# Patient Record
Sex: Female | Born: 1940 | Race: Black or African American | Hispanic: No | Smoking: Former smoker
Health system: Southern US, Community
[De-identification: ages and names within clinical notes are randomized; demographics above are authoritative.]

## PROBLEM LIST (undated history)

## (undated) DIAGNOSIS — E1122 Type 2 diabetes mellitus with diabetic chronic kidney disease: Secondary | ICD-10-CM

## (undated) DIAGNOSIS — E538 Deficiency of other specified B group vitamins: Secondary | ICD-10-CM

## (undated) DIAGNOSIS — I10 Essential (primary) hypertension: Secondary | ICD-10-CM

## (undated) DIAGNOSIS — E119 Type 2 diabetes mellitus without complications: Secondary | ICD-10-CM

## (undated) DIAGNOSIS — I2510 Atherosclerotic heart disease of native coronary artery without angina pectoris: Secondary | ICD-10-CM

## (undated) DIAGNOSIS — N182 Chronic kidney disease, stage 2 (mild): Secondary | ICD-10-CM

## (undated) DIAGNOSIS — E7800 Pure hypercholesterolemia, unspecified: Secondary | ICD-10-CM

## (undated) DIAGNOSIS — M109 Gout, unspecified: Secondary | ICD-10-CM

## (undated) DIAGNOSIS — E785 Hyperlipidemia, unspecified: Secondary | ICD-10-CM

## (undated) HISTORY — DX: Gout, unspecified: M10.9

## (undated) HISTORY — DX: Essential (primary) hypertension: I10

## (undated) HISTORY — DX: Deficiency of other specified B group vitamins: E53.8

## (undated) HISTORY — DX: Atherosclerotic heart disease of native coronary artery without angina pectoris: I25.10

## (undated) HISTORY — DX: Chronic kidney disease, stage 2 (mild): N18.2

## (undated) HISTORY — DX: Hyperlipidemia, unspecified: E78.5

## (undated) HISTORY — DX: Pure hypercholesterolemia, unspecified: E78.00

## (undated) HISTORY — DX: Type 2 diabetes mellitus without complications: E11.9

## (undated) HISTORY — DX: Type 2 diabetes mellitus with diabetic chronic kidney disease: E11.22

---

## 1974-05-14 HISTORY — PX: TUBAL LIGATION: SHX77

## 1997-12-08 ENCOUNTER — Other Ambulatory Visit: Admission: RE | Admit: 1997-12-08 | Discharge: 1997-12-08 | Payer: Self-pay | Admitting: Emergency Medicine

## 1999-03-21 ENCOUNTER — Other Ambulatory Visit: Admission: RE | Admit: 1999-03-21 | Discharge: 1999-03-21 | Payer: Self-pay | Admitting: Family Medicine

## 2000-08-20 ENCOUNTER — Encounter: Admission: RE | Admit: 2000-08-20 | Discharge: 2000-08-20 | Payer: Self-pay | Admitting: Internal Medicine

## 2000-08-20 ENCOUNTER — Encounter: Payer: Self-pay | Admitting: Internal Medicine

## 2000-09-10 ENCOUNTER — Other Ambulatory Visit: Admission: RE | Admit: 2000-09-10 | Discharge: 2000-09-10 | Payer: Self-pay | Admitting: Internal Medicine

## 2000-10-09 ENCOUNTER — Encounter: Admission: RE | Admit: 2000-10-09 | Discharge: 2001-01-07 | Payer: Self-pay | Admitting: Internal Medicine

## 2002-03-25 ENCOUNTER — Encounter: Payer: Self-pay | Admitting: Emergency Medicine

## 2002-03-25 ENCOUNTER — Emergency Department (HOSPITAL_COMMUNITY): Admission: EM | Admit: 2002-03-25 | Discharge: 2002-03-25 | Payer: Self-pay | Admitting: Emergency Medicine

## 2003-03-09 ENCOUNTER — Emergency Department (HOSPITAL_COMMUNITY): Admission: EM | Admit: 2003-03-09 | Discharge: 2003-03-09 | Payer: Self-pay | Admitting: Emergency Medicine

## 2004-07-20 ENCOUNTER — Emergency Department (HOSPITAL_COMMUNITY): Admission: EM | Admit: 2004-07-20 | Discharge: 2004-07-20 | Payer: Self-pay | Admitting: Family Medicine

## 2004-07-23 ENCOUNTER — Emergency Department (HOSPITAL_COMMUNITY): Admission: EM | Admit: 2004-07-23 | Discharge: 2004-07-23 | Payer: Self-pay | Admitting: Family Medicine

## 2004-08-08 ENCOUNTER — Encounter: Admission: RE | Admit: 2004-08-08 | Discharge: 2004-08-16 | Payer: Self-pay | Admitting: Emergency Medicine

## 2005-07-24 ENCOUNTER — Other Ambulatory Visit: Admission: RE | Admit: 2005-07-24 | Discharge: 2005-07-24 | Payer: Self-pay | Admitting: Family Medicine

## 2005-08-31 ENCOUNTER — Ambulatory Visit (HOSPITAL_COMMUNITY): Admission: RE | Admit: 2005-08-31 | Discharge: 2005-08-31 | Payer: Self-pay | Admitting: Gastroenterology

## 2006-10-10 ENCOUNTER — Encounter: Admission: RE | Admit: 2006-10-10 | Discharge: 2006-10-10 | Payer: Self-pay | Admitting: Family Medicine

## 2006-10-31 ENCOUNTER — Other Ambulatory Visit: Admission: RE | Admit: 2006-10-31 | Discharge: 2006-10-31 | Payer: Self-pay | Admitting: Family Medicine

## 2006-11-11 ENCOUNTER — Encounter: Admission: RE | Admit: 2006-11-11 | Discharge: 2006-11-11 | Payer: Self-pay | Admitting: Orthopedic Surgery

## 2007-11-18 ENCOUNTER — Other Ambulatory Visit: Admission: RE | Admit: 2007-11-18 | Discharge: 2007-11-18 | Payer: Self-pay | Admitting: Family Medicine

## 2009-06-06 ENCOUNTER — Encounter: Admission: RE | Admit: 2009-06-06 | Discharge: 2009-06-06 | Payer: Self-pay | Admitting: Internal Medicine

## 2010-06-03 ENCOUNTER — Other Ambulatory Visit: Payer: Self-pay | Admitting: Internal Medicine

## 2010-06-03 DIAGNOSIS — Z0000 Encounter for general adult medical examination without abnormal findings: Secondary | ICD-10-CM

## 2010-06-15 ENCOUNTER — Ambulatory Visit
Admission: RE | Admit: 2010-06-15 | Discharge: 2010-06-15 | Disposition: A | Discharge status: Home / Self Care | Payer: Medicare Other | Source: Ambulatory Visit | Attending: Internal Medicine | Admitting: Internal Medicine

## 2010-06-15 DIAGNOSIS — Z0000 Encounter for general adult medical examination without abnormal findings: Secondary | ICD-10-CM

## 2010-09-29 NOTE — Op Note (Signed)
[name], Pamela Key NO.:  [id number]   MEDICAL RECORD NO.:  [id number]          PATIENT TYPE:  AMB   LOCATION:  ENDO                         FACILITY:  [facility]   PHYSICIAN:  Anselmo Rod, M.D.  DATE OF BIRTH:  11-11-1940   DATE OF PROCEDURE:  08/31/2005  DATE OF DISCHARGE:                                 OPERATIVE REPORT   PROCEDURE PERFORMED:  Screening colonoscopy.   ENDOSCOPIST:  Anselmo Rod, M.D.   INSTRUMENT USED:  Olympus video colonoscope.   INDICATIONS FOR PROCEDURE:  The patient is a 70 year old African-American  female undergoing screening colonoscopy.  The patient has a family history  of colon cancer in several family members.  Rule out colonic polyps, masses,  etc.   PREPROCEDURE PREPARATION:  Informed consent was procured from the patient.  The patient was fasted for four hours prior to the procedure and prepped  with OsmoPrep pills the night of and the morning of the procedure.  The  risks and benefits of the procedure including a 10% miss rate for cancer or  polyps was discussed with the patient as well.   PREPROCEDURE PHYSICAL:  The patient had stable vital signs.  Neck supple.  Chest clear to auscultation.  S1 and S2 regular.  Abdomen soft with normal  bowel sounds.   DESCRIPTION OF PROCEDURE:  The patient was placed in left lateral decubitus  position and sedated with 100 mcg of fentanyl and 7 mg of Versed in slow  incremental doses.  Once the patient was adequately sedated and maintained  on low flow oxygen and continuous cardiac monitoring, the Olympus video  colonoscope was advanced from the rectum to the cecum.  The appendicular  orifice and ileocecal valve were clearly visualized and photographed. The  terminal ileum appeared normal without lesions.  No masses, polyps, erosions  and no diverticula were seen.  Small internal hemorrhoids were seen on  retroflexion in the rectum. The patient tolerated the procedure well without  immediate complication.   IMPRESSION:  Normal colonoscopy up to the terminal ileum except for small  internal hemorrhoids.  No masses or polyps seen.   RECOMMENDATIONS:  1.  A repeat colonoscopy has been recommended in the next five years unless      the patient develops any abnormal symptoms in the interim.  2.  Outpatient followup as need arises in the future.      Anselmo Rod, M.D.  Electronically Signed     JNM/MEDQ  D:  08/31/2005  T:  09/03/2005  Job:  [id number]   cc:   Renaye Rakers, M.D.  Fax: [phone]

## 2010-10-15 ENCOUNTER — Emergency Department (HOSPITAL_COMMUNITY): Payer: Medicare Other

## 2010-10-15 ENCOUNTER — Emergency Department (HOSPITAL_COMMUNITY)
Admission: EM | Admit: 2010-10-15 | Discharge: 2010-10-15 | Disposition: A | Discharge status: Home / Self Care | Payer: Medicare Other | Attending: Emergency Medicine | Admitting: Emergency Medicine

## 2010-10-15 DIAGNOSIS — I10 Essential (primary) hypertension: Secondary | ICD-10-CM | POA: Insufficient documentation

## 2010-10-15 DIAGNOSIS — M109 Gout, unspecified: Secondary | ICD-10-CM | POA: Insufficient documentation

## 2010-10-15 DIAGNOSIS — M79609 Pain in unspecified limb: Secondary | ICD-10-CM | POA: Insufficient documentation

## 2011-07-20 ENCOUNTER — Other Ambulatory Visit: Payer: Self-pay | Admitting: Internal Medicine

## 2011-07-20 DIAGNOSIS — Z1231 Encounter for screening mammogram for malignant neoplasm of breast: Secondary | ICD-10-CM

## 2011-07-31 ENCOUNTER — Ambulatory Visit: Payer: Medicare Other

## 2011-08-27 ENCOUNTER — Ambulatory Visit
Admission: RE | Admit: 2011-08-27 | Discharge: 2011-08-27 | Disposition: A | Discharge status: Home / Self Care | Payer: Medicare Other | Source: Ambulatory Visit | Attending: Internal Medicine | Admitting: Internal Medicine

## 2011-08-27 DIAGNOSIS — Z1231 Encounter for screening mammogram for malignant neoplasm of breast: Secondary | ICD-10-CM

## 2012-09-12 ENCOUNTER — Other Ambulatory Visit: Payer: Self-pay

## 2012-09-12 DIAGNOSIS — Z1231 Encounter for screening mammogram for malignant neoplasm of breast: Secondary | ICD-10-CM

## 2012-10-20 ENCOUNTER — Ambulatory Visit
Admission: RE | Admit: 2012-10-20 | Discharge: 2012-10-20 | Disposition: A | Discharge status: Home / Self Care | Payer: Medicare Other | Source: Ambulatory Visit

## 2012-10-20 DIAGNOSIS — Z1231 Encounter for screening mammogram for malignant neoplasm of breast: Secondary | ICD-10-CM

## 2013-10-23 ENCOUNTER — Other Ambulatory Visit: Payer: Self-pay

## 2013-10-23 DIAGNOSIS — Z1231 Encounter for screening mammogram for malignant neoplasm of breast: Secondary | ICD-10-CM

## 2013-10-30 ENCOUNTER — Ambulatory Visit: Payer: Medicare Other

## 2013-12-04 ENCOUNTER — Ambulatory Visit
Admission: RE | Admit: 2013-12-04 | Discharge: 2013-12-04 | Disposition: A | Discharge status: Home / Self Care | Payer: Medicare Other | Source: Ambulatory Visit

## 2013-12-04 ENCOUNTER — Encounter (INDEPENDENT_AMBULATORY_CARE_PROVIDER_SITE_OTHER): Payer: Self-pay

## 2013-12-04 DIAGNOSIS — Z1231 Encounter for screening mammogram for malignant neoplasm of breast: Secondary | ICD-10-CM

## 2014-10-28 ENCOUNTER — Emergency Department (HOSPITAL_COMMUNITY)
Admission: EM | Admit: 2014-10-28 | Discharge: 2014-10-28 | Disposition: A | Discharge status: Home / Self Care | Payer: Medicare Other | Attending: Emergency Medicine | Admitting: Emergency Medicine

## 2014-10-28 ENCOUNTER — Encounter (HOSPITAL_COMMUNITY): Payer: Self-pay | Admitting: Emergency Medicine

## 2014-10-28 DIAGNOSIS — R42 Dizziness and giddiness: Secondary | ICD-10-CM

## 2014-10-28 DIAGNOSIS — Z79899 Other long term (current) drug therapy: Secondary | ICD-10-CM | POA: Insufficient documentation

## 2014-10-28 DIAGNOSIS — I159 Secondary hypertension, unspecified: Secondary | ICD-10-CM | POA: Insufficient documentation

## 2014-10-28 DIAGNOSIS — E119 Type 2 diabetes mellitus without complications: Secondary | ICD-10-CM | POA: Diagnosis not present

## 2014-10-28 HISTORY — DX: Essential (primary) hypertension: I10

## 2014-10-28 HISTORY — DX: Type 2 diabetes mellitus without complications: E11.9

## 2014-10-28 LAB — CBC
HCT: 39.8 % (ref 36.0–46.0)
Hemoglobin: 13.1 g/dL (ref 12.0–15.0)
MCH: 30.8 pg (ref 26.0–34.0)
MCHC: 32.9 g/dL (ref 30.0–36.0)
MCV: 93.4 fL (ref 78.0–100.0)
Platelets: 186 10*3/uL (ref 150–400)
RBC: 4.26 MIL/uL (ref 3.87–5.11)
RDW: 13.6 % (ref 11.5–15.5)
WBC: 5.7 10*3/uL (ref 4.0–10.5)

## 2014-10-28 LAB — BASIC METABOLIC PANEL
Anion gap: 8 (ref 5–15)
BUN: 16 mg/dL (ref 6–20)
CO2: 23 mmol/L (ref 22–32)
Calcium: 9.2 mg/dL (ref 8.9–10.3)
Chloride: 106 mmol/L (ref 101–111)
Creatinine, Ser: 0.86 mg/dL (ref 0.44–1.00)
GFR calc Af Amer: >60 mL/min (ref >60)
GFR calc non Af Amer: >60 mL/min (ref >60)
Glucose, Bld: 96 mg/dL (ref 65–99)
Potassium: 3.8 mmol/L (ref 3.5–5.1)
Sodium: 137 mmol/L (ref 135–145)

## 2014-10-28 LAB — CBG MONITORING, ED: Glucose-Capillary: 87 mg/dL (ref 65–99)

## 2014-10-28 NOTE — ED Notes (Signed)
Delay on lab draw, pt enroute to bathroom

## 2014-10-28 NOTE — ED Notes (Signed)
Pt c/o lightheadedness x 2 days.  Denies NVD.  Denies pain.

## 2014-10-28 NOTE — ED Provider Notes (Signed)
CSN: [id number]     Arrival date & time 10/28/14  1819 History   First MD Initiated Contact with Patient 10/28/14 1937     Chief Complaint  Patient presents with  . Dizziness     (Consider location/radiation/quality/duration/timing/severity/associated sxs/prior Treatment) Patient is a 74 y.o. female presenting with dizziness.  Dizziness Quality:  Lightheadedness Severity:  Moderate Onset quality:  Sudden Duration:  2 days Timing:  Intermittent Progression:  Waxing and waning Chronicity:  New Relieved by:  Nothing Worsened by:  Nothing Ineffective treatments:  None tried Associated symptoms: no blood in stool, no chest pain, no diarrhea, no headaches, no hearing loss, no nausea, no palpitations, no shortness of breath, no syncope, no tinnitus, no vomiting and no weakness   Risk factors: no anemia, no heart disease, no hx of stroke, no hx of vertigo, no Meniere's disease, no multiple medications and no new medications     Past Medical History  Diagnosis Date  . Diabetes mellitus without complication   . Hypertension    History reviewed. No pertinent past surgical history. History reviewed. No pertinent family history. History  Substance Use Topics  . Smoking status: Never Smoker   . Smokeless tobacco: Not on file  . Alcohol Use: No   OB History    No data available     Review of Systems  Constitutional: Negative for fever, chills, diaphoresis, activity change, appetite change and fatigue.  HENT: Negative for congestion, facial swelling, hearing loss, rhinorrhea, sore throat and tinnitus.   Eyes: Negative for photophobia and discharge.  Respiratory: Negative for cough, chest tightness and shortness of breath.   Cardiovascular: Negative for chest pain, palpitations, leg swelling and syncope.  Gastrointestinal: Negative for nausea, vomiting, abdominal pain, diarrhea and blood in stool.  Endocrine: Negative for polydipsia and polyuria.  Genitourinary: Negative for  dysuria, frequency, difficulty urinating and pelvic pain.  Musculoskeletal: Negative for back pain, arthralgias, neck pain and neck stiffness.  Skin: Negative for color change and wound.  Allergic/Immunologic: Negative for immunocompromised state.  Neurological: Positive for dizziness and light-headedness. Negative for facial asymmetry, weakness, numbness and headaches.  Hematological: Does not bruise/bleed easily.  Psychiatric/Behavioral: Negative for confusion and agitation.      Allergies  Review of patient's allergies indicates no known allergies.  Home Medications   Prior to Admission medications   Medication Sig Start Date End Date Taking? Authorizing Provider  CALCIUM PO Take 1 tablet by mouth daily.   Yes Historical Provider, MD  cholecalciferol (VITAMIN D) 1000 UNITS tablet Take 1,000 Units by mouth daily.   Yes Historical Provider, MD  lisinopril (PRINIVIL,ZESTRIL) 10 MG tablet Take 10 mg by mouth daily.  10/25/14  Yes Historical Provider, MD  metFORMIN (GLUCOPHAGE) 500 MG tablet Take 500 mg by mouth 2 (two) times daily with a meal.  08/23/14  Yes Historical Provider, MD  simvastatin (ZOCOR) 10 MG tablet Take 10 mg by mouth daily.  10/14/14  Yes Historical Provider, MD   BP 171/83 mmHg  Pulse 66  Temp(Src) 98.1 F (36.7 C) (Oral)  Resp 16  SpO2 99% Physical Exam  Constitutional: She is oriented to person, place, and time. She appears well-developed and well-nourished. No distress.  HENT:  Head: Normocephalic and atraumatic.  Mouth/Throat: No oropharyngeal exudate.  Eyes: Pupils are equal, round, and reactive to light.  Neck: Normal range of motion. Neck supple.  Cardiovascular: Normal rate, regular rhythm and normal heart sounds.  Exam reveals no gallop and no friction rub.   No murmur  heard. Pulmonary/Chest: Effort normal and breath sounds normal. No respiratory distress. She has no wheezes. She has no rales.  Abdominal: Soft. Bowel sounds are normal. She exhibits no  distension and no mass. There is no tenderness. There is no rebound and no guarding.  Musculoskeletal: Normal range of motion. She exhibits no edema or tenderness.  Neurological: She is alert and oriented to person, place, and time. She has normal strength. She displays no tremor. No cranial nerve deficit or sensory deficit. She exhibits normal muscle tone. Coordination and gait normal. GCS eye subscore is 4. GCS verbal subscore is 5. GCS motor subscore is 6.  Skin: Skin is warm and dry.  Psychiatric: She has a normal mood and affect.    ED Course  Procedures (including critical care time) Labs Review Labs Reviewed  CBC  BASIC METABOLIC PANEL  CBG MONITORING, ED    Imaging Review No results found.   EKG Interpretation   Date/Time:  Thursday October 28 2014 18:26:36 EDT Ventricular Rate:  60 PR Interval:  167 QRS Duration: 81 QT Interval:  420 QTC Calculation: 420 R Axis:   43 Text Interpretation:  Sinus rhythm Nonspecific T abnormalities, lateral  leads Baseline wander in lead(s) V2 V6 No prior for comparison Confirmed  by Jyren Cerasoli  MD, Werner Labella ([id number]) on 10/28/2014 7:37:47 PM      MDM   Final diagnoses:  Episodic lightheadedness  Secondary hypertension, unspecified    Pt is a 74 y.o. female with Pmhx as above who presents with 2 days of intermittent lightheadedness, unchanged with position and wi/o assoc symptoms. On PE, Pt hypertensive, but has no findings to suggest end organ dysfunction including no EKG changes, nml cr, no CP, SOB. Neuro exam nml. Doubt, CVA, TIA, SAH. Symptoms likely due to elevated BP.  I have asked her to start a log of her blood pressure at home to follow-up with with her primary doctor, Dr. Renae Gloss.     Mendel Corning evaluation in the Emergency Department is complete. It has been determined that no acute conditions requiring further emergency intervention are present at this time. The patient/guardian have been advised of the diagnosis and plan. We  have discussed signs and symptoms that warrant return to the ED, such as changes or worsening in symptoms, Headache, chest pain, numbness, weakness       Toy Cookey, MD 10/28/14 2037

## 2014-10-28 NOTE — ED Notes (Signed)
MD at bedside. 

## 2014-10-28 NOTE — Discharge Instructions (Signed)
Hypertension °Hypertension, commonly called high blood pressure, is when the force of blood pumping through your arteries is too strong. Your arteries are the blood vessels that carry blood from your heart throughout your body. A blood pressure reading consists of a higher number over a lower number, such as 110/72. The higher number (systolic) is the pressure inside your arteries when your heart pumps. The lower number (diastolic) is the pressure inside your arteries when your heart relaxes. Ideally you want your blood pressure below 120/80. °Hypertension forces your heart to work harder to pump blood. Your arteries may become narrow or stiff. Having hypertension puts you at risk for heart disease, stroke, and other problems.  °RISK FACTORS °Some risk factors for high blood pressure are controllable. Others are not.  °Risk factors you cannot control include:  °· Race. You may be at higher risk if you are African American. °· Age. Risk increases with age. °· Gender. Men are at higher risk than women before age 45 years. After age 65, women are at higher risk than men. °Risk factors you can control include: °· Not getting enough exercise or physical activity. °· Being overweight. °· Getting too much fat, sugar, calories, or salt in your diet. °· Drinking too much alcohol. °SIGNS AND SYMPTOMS °Hypertension does not usually cause signs or symptoms. Extremely high blood pressure (hypertensive crisis) may cause headache, anxiety, shortness of breath, and nosebleed. °DIAGNOSIS  °To check if you have hypertension, your health care provider will measure your blood pressure while you are seated, with your arm held at the level of your heart. It should be measured at least twice using the same arm. Certain conditions can cause a difference in blood pressure between your right and left arms. A blood pressure reading that is higher than normal on one occasion does not mean that you need treatment. If one blood pressure reading  is high, ask your health care provider about having it checked again. °TREATMENT  °Treating high blood pressure includes making lifestyle changes and possibly taking medicine. Living a healthy lifestyle can help lower high blood pressure. You may need to change some of your habits. °Lifestyle changes may include: °· Following the DASH diet. This diet is high in fruits, vegetables, and whole grains. It is low in salt, red meat, and added sugars. °· Getting at least 2½ hours of brisk physical activity every week. °· Losing weight if necessary. °· Not smoking. °· Limiting alcoholic beverages. °· Learning ways to reduce stress. ° If lifestyle changes are not enough to get your blood pressure under control, your health care provider may prescribe medicine. You may need to take more than one. Work closely with your health care provider to understand the risks and benefits. °HOME CARE INSTRUCTIONS °· Have your blood pressure rechecked as directed by your health care provider.   °· Take medicines only as directed by your health care provider. Follow the directions carefully. Blood pressure medicines must be taken as prescribed. The medicine does not work as well when you skip doses. Skipping doses also puts you at risk for problems.   °· Do not smoke.   °· Monitor your blood pressure at home as directed by your health care provider.  °SEEK MEDICAL CARE IF:  °· You think you are having a reaction to medicines taken. °· You have recurrent headaches or feel dizzy. °· You have swelling in your ankles. °· You have trouble with your vision. °SEEK IMMEDIATE MEDICAL CARE IF: °· You develop a severe headache or confusion. °·   You have unusual weakness, numbness, or feel faint. °· You have severe chest or abdominal pain. °· You vomit repeatedly. °· You have trouble breathing. °MAKE SURE YOU:  °· Understand these instructions. °· Will watch your condition. °· Will get help right away if you are not doing well or get worse. °Document  Released: 04/30/2005 Document Revised: 09/14/2013 Document Reviewed: 02/20/2013 °ExitCare® Patient Information ©2015 [employer]. This information is not intended to replace advice given to you by your health care provider. Make sure you discuss any questions you have with your health care provider. °Dizziness °Dizziness is a common problem. It is a feeling of unsteadiness or light-headedness. You may feel like you are about to faint. Dizziness can lead to injury if you stumble or fall. A person of any age group can suffer from dizziness, but dizziness is more common in older adults. °CAUSES  °Dizziness can be caused by many different things, including: °· Middle ear problems. °· Standing for too long. °· Infections. °· An allergic reaction. °· Aging. °· An emotional response to something, such as the sight of blood. °· Side effects of medicines. °· Tiredness. °· Problems with circulation or blood pressure. °· Excessive use of alcohol or medicines, or illegal drug use. °· Breathing too fast (hyperventilation). °· An irregular heart rhythm (arrhythmia). °· A low red blood cell count (anemia). °· Pregnancy. °· Vomiting, diarrhea, fever, or other illnesses that cause body fluid loss (dehydration). °· Diseases or conditions such as Parkinson's disease, high blood pressure (hypertension), diabetes, and thyroid problems. °· Exposure to extreme heat. °DIAGNOSIS  °Your health care provider will ask about your symptoms, perform a physical exam, and perform an electrocardiogram (ECG) to record the electrical activity of your heart. Your health care provider may also perform other heart or blood tests to determine the cause of your dizziness. These may include: °· Transthoracic echocardiogram (TTE). During echocardiography, sound waves are used to evaluate how blood flows through your heart. °· Transesophageal echocardiogram (TEE). °· Cardiac monitoring. This allows your health care provider to monitor your heart rate and  rhythm in real time. °· Holter monitor. This is a portable device that records your heartbeat and can help diagnose heart arrhythmias. It allows your health care provider to track your heart activity for several days if needed. °· Stress tests by exercise or by giving medicine that makes the heart beat faster. °TREATMENT  °Treatment of dizziness depends on the cause of your symptoms and can vary greatly. °HOME CARE INSTRUCTIONS  °· Drink enough fluids to keep your urine clear or pale yellow. This is especially important in very hot weather. In older adults, it is also important in cold weather. °· Take your medicine exactly as directed if your dizziness is caused by medicines. When taking blood pressure medicines, it is especially important to get up slowly. °¨ Rise slowly from chairs and steady yourself until you feel okay. °¨ In the morning, first sit up on the side of the bed. When you feel okay, stand slowly while holding onto something until you know your balance is fine. °· Move your legs often if you need to stand in one place for a long time. Tighten and relax your muscles in your legs while standing. °· Have someone stay with you for 1-2 days if dizziness continues to be a problem. Do this until you feel you are well enough to stay alone. Have the person call your health care provider if he or she notices changes in you   that are concerning. °· Do not drive or use heavy machinery if you feel dizzy. °· Do not drink alcohol. °SEEK IMMEDIATE MEDICAL CARE IF:  °· Your dizziness or light-headedness gets worse. °· You feel nauseous or vomit. °· You have problems talking, walking, or using your arms, hands, or legs. °· You feel weak. °· You are not thinking clearly or you have trouble forming sentences. It may take a friend or family member to notice this. °· You have chest pain, abdominal pain, shortness of breath, or sweating. °· Your vision changes. °· You notice any bleeding. °· You have side effects from  medicine that seems to be getting worse rather than better. °MAKE SURE YOU:  °· Understand these instructions. °· Will watch your condition. °· Will get help right away if you are not doing well or get worse. °Document Released: 10/24/2000 Document Revised: 05/05/2013 Document Reviewed: 11/17/2010 °ExitCare® Patient Information ©2015 [employer]. This information is not intended to replace advice given to you by your health care provider. Make sure you discuss any questions you have with your health care provider. ° °

## 2014-12-15 ENCOUNTER — Other Ambulatory Visit: Payer: Self-pay

## 2014-12-15 DIAGNOSIS — Z1231 Encounter for screening mammogram for malignant neoplasm of breast: Secondary | ICD-10-CM

## 2015-01-24 ENCOUNTER — Ambulatory Visit
Admission: RE | Admit: 2015-01-24 | Discharge: 2015-01-24 | Disposition: A | Discharge status: Home / Self Care | Payer: Medicare Other | Source: Ambulatory Visit

## 2015-01-24 DIAGNOSIS — Z1231 Encounter for screening mammogram for malignant neoplasm of breast: Secondary | ICD-10-CM

## 2015-11-28 ENCOUNTER — Emergency Department (HOSPITAL_COMMUNITY): Payer: Medicare Other

## 2015-11-28 ENCOUNTER — Emergency Department (HOSPITAL_COMMUNITY)
Admission: EM | Admit: 2015-11-28 | Discharge: 2015-11-28 | Disposition: A | Discharge status: Home / Self Care | Payer: Medicare Other | Attending: Emergency Medicine | Admitting: Emergency Medicine

## 2015-11-28 ENCOUNTER — Encounter (HOSPITAL_COMMUNITY): Payer: Self-pay | Admitting: Emergency Medicine

## 2015-11-28 DIAGNOSIS — I10 Essential (primary) hypertension: Secondary | ICD-10-CM | POA: Insufficient documentation

## 2015-11-28 DIAGNOSIS — W19XXXA Unspecified fall, initial encounter: Secondary | ICD-10-CM

## 2015-11-28 DIAGNOSIS — R1011 Right upper quadrant pain: Secondary | ICD-10-CM | POA: Insufficient documentation

## 2015-11-28 DIAGNOSIS — W1839XA Other fall on same level, initial encounter: Secondary | ICD-10-CM | POA: Insufficient documentation

## 2015-11-28 DIAGNOSIS — M545 Low back pain: Secondary | ICD-10-CM | POA: Insufficient documentation

## 2015-11-28 DIAGNOSIS — Y929 Unspecified place or not applicable: Secondary | ICD-10-CM | POA: Insufficient documentation

## 2015-11-28 DIAGNOSIS — E119 Type 2 diabetes mellitus without complications: Secondary | ICD-10-CM | POA: Diagnosis not present

## 2015-11-28 DIAGNOSIS — Y93A1 Activity, exercise machines primarily for cardiorespiratory conditioning: Secondary | ICD-10-CM | POA: Insufficient documentation

## 2015-11-28 DIAGNOSIS — Y999 Unspecified external cause status: Secondary | ICD-10-CM | POA: Diagnosis not present

## 2015-11-28 DIAGNOSIS — Z7984 Long term (current) use of oral hypoglycemic drugs: Secondary | ICD-10-CM | POA: Insufficient documentation

## 2015-11-28 DIAGNOSIS — Z79899 Other long term (current) drug therapy: Secondary | ICD-10-CM | POA: Insufficient documentation

## 2015-11-28 LAB — BASIC METABOLIC PANEL
Anion gap: 9 (ref 5–15)
BUN: 14 mg/dL (ref 6–20)
CO2: 24 mmol/L (ref 22–32)
Calcium: 9.5 mg/dL (ref 8.9–10.3)
Chloride: 109 mmol/L (ref 101–111)
Creatinine, Ser: 0.86 mg/dL (ref 0.44–1.00)
GFR calc Af Amer: >60 mL/min (ref >60)
GFR calc non Af Amer: >60 mL/min (ref >60)
Glucose, Bld: 109 mg/dL — ABNORMAL HIGH (ref 65–99)
Potassium: 3.6 mmol/L (ref 3.5–5.1)
Sodium: 142 mmol/L (ref 135–145)

## 2015-11-28 MED ORDER — LORAZEPAM 2 MG/ML IJ SOLN
0.5000 mg | Freq: Once | INTRAMUSCULAR | Status: AC
  Administered 2015-11-28: 0.5 mg via INTRAVENOUS
  Filled 2015-11-28: qty 1

## 2015-11-28 MED ORDER — MORPHINE SULFATE (PF) 4 MG/ML IV SOLN
4.0000 mg | Freq: Once | INTRAVENOUS | Status: AC
  Administered 2015-11-28: 4 mg via INTRAVENOUS
  Filled 2015-11-28: qty 1

## 2015-11-28 MED ORDER — METHOCARBAMOL 500 MG PO TABS: 500.0000 mg | ORAL_TABLET | Freq: Two times a day (BID) | ORAL | Status: DC

## 2015-11-28 MED ORDER — HYDROCODONE-ACETAMINOPHEN 5-325 MG PO TABS: 1.0000 | ORAL_TABLET | ORAL | Status: DC | PRN

## 2015-11-28 MED ORDER — ONDANSETRON HCL 4 MG/2ML IJ SOLN
4.0000 mg | Freq: Once | INTRAMUSCULAR | Status: AC
  Administered 2015-11-28: 4 mg via INTRAVENOUS
  Filled 2015-11-28: qty 2

## 2015-11-28 MED ORDER — IOPAMIDOL (ISOVUE-300) INJECTION 61%
100.0000 mL | Freq: Once | INTRAVENOUS | Status: AC | PRN
  Administered 2015-11-28: 100 mL via INTRAVENOUS

## 2015-11-28 MED ORDER — SODIUM CHLORIDE 0.9 % IV SOLN
INTRAVENOUS | Status: DC
  Administered 2015-11-28: 13:00:00 via INTRAVENOUS

## 2015-11-28 NOTE — ED Notes (Signed)
Per EMS, [facility], where she fell off of the treadmill, falling forward. Bilateral knee abrasions. Pt reports abdominal and lower back pain. Denies head injury and LOC.

## 2015-11-28 NOTE — Discharge Instructions (Signed)
Contusion °A contusion is a deep bruise. Contusions are the result of a blunt injury to tissues and muscle fibers under the skin. The injury causes bleeding under the skin. The skin overlying the contusion may turn blue, purple, or yellow. Minor injuries will give you a painless contusion, but more severe contusions may stay painful and swollen for a few weeks.  °CAUSES  °This condition is usually caused by a blow, trauma, or direct force to an area of the body. °SYMPTOMS  °Symptoms of this condition include: °· Swelling of the injured area. °· Pain and tenderness in the injured area. °· Discoloration. The area may have redness and then turn blue, purple, or yellow. °DIAGNOSIS  °This condition is diagnosed based on a physical exam and medical history. An X-ray, CT scan, or MRI may be needed to determine if there are any associated injuries, such as broken bones (fractures). °TREATMENT  °Specific treatment for this condition depends on what area of the body was injured. In general, the best treatment for a contusion is resting, icing, applying pressure to (compression), and elevating the injured area. This is often called the RICE strategy. Over-the-counter anti-inflammatory medicines may also be recommended for pain control.  °HOME CARE INSTRUCTIONS  °· Rest the injured area. °· If directed, apply ice to the injured area: °¨ Put ice in a plastic bag. °¨ Place a towel between your skin and the bag. °¨ Leave the ice on for 20 minutes, 2-3 times per day. °· If directed, apply light compression to the injured area using an elastic bandage. Make sure the bandage is not wrapped too tightly. Remove and reapply the bandage as directed by your health care provider. °· If possible, raise (elevate) the injured area above the level of your heart while you are sitting or lying down. °· Take over-the-counter and prescription medicines only as told by your health care provider. °SEEK MEDICAL CARE IF: °· Your symptoms do not  improve after several days of treatment. °· Your symptoms get worse. °· You have difficulty moving the injured area. °SEEK IMMEDIATE MEDICAL CARE IF:  °· You have severe pain. °· You have numbness in a hand or foot. °· Your hand or foot turns pale or cold. °  °This information is not intended to replace advice given to you by your health care provider. Make sure you discuss any questions you have with your health care provider. °  °Document Released: 02/07/2005 Document Revised: 01/19/2015 Document Reviewed: 09/15/2014 °Elsevier Interactive Patient Education ©2016 [employer] ° °Muscle Strain °A muscle strain is an injury that occurs when a muscle is stretched beyond its normal length. Usually a small number of muscle fibers are torn when this happens. Muscle strain is rated in degrees. First-degree strains have the least amount of muscle fiber tearing and pain. Second-degree and third-degree strains have increasingly more tearing and pain.  °Usually, recovery from muscle strain takes 1-2 weeks. Complete healing takes 5-6 weeks.  °CAUSES  °Muscle strain happens when a sudden, violent force placed on a muscle stretches it too far. This may occur with lifting, sports, or a fall.  °RISK FACTORS °Muscle strain is especially common in athletes.  °SIGNS AND SYMPTOMS °At the site of the muscle strain, there may be: °· Pain. °· Bruising. °· Swelling. °· Difficulty using the muscle due to pain or lack of normal function. °DIAGNOSIS  °Your health care provider will perform a physical exam and ask about your medical history. °TREATMENT  °Often, the best treatment   for a muscle strain is resting, icing, and applying cold compresses to the injured area.   °HOME CARE INSTRUCTIONS  °· Use the PRICE method of treatment to promote muscle healing during the first 2-3 days after your injury. The PRICE method involves: °¨ Protecting the muscle from being injured again. °¨ Restricting your activity and resting the injured body  part. °¨ Icing your injury. To do this, put ice in a plastic bag. Place a towel between your skin and the bag. Then, apply the ice and leave it on from 15-20 minutes each hour. After the third day, switch to moist heat packs. °¨ Apply compression to the injured area with a splint or elastic bandage. Be careful not to wrap it too tightly. This may interfere with blood circulation or increase swelling. °¨ Elevate the injured body part above the level of your heart as often as you can. °· Only take over-the-counter or prescription medicines for pain, discomfort, or fever as directed by your health care provider. °· Warming up prior to exercise helps to prevent future muscle strains. °SEEK MEDICAL CARE IF:  °· You have increasing pain or swelling in the injured area. °· You have numbness, tingling, or a significant loss of strength in the injured area. °MAKE SURE YOU:  °· Understand these instructions. °· Will watch your condition. °· Will get help right away if you are not doing well or get worse. °  °This information is not intended to replace advice given to you by your health care provider. Make sure you discuss any questions you have with your health care provider. °  °Document Released: 04/30/2005 Document Revised: 02/18/2013 Document Reviewed: 11/27/2012 °Elsevier Interactive Patient Education ©2016 [employer]. ° °

## 2015-11-28 NOTE — ED Notes (Signed)
Patient transported to X-ray 

## 2015-11-28 NOTE — ED Notes (Signed)
Patient transported to CT 

## 2015-11-28 NOTE — ED Notes (Signed)
Bed: WA03 Expected date:  Expected time:  Means of arrival:  Comments: EMS- 75yo F, fell off treadmill/multiple complaints

## 2015-11-28 NOTE — ED Provider Notes (Signed)
CSN: [id number]     Arrival date & time 11/28/15  1210 History   First MD Initiated Contact with Patient 11/28/15 1226     Chief Complaint  Patient presents with  . Fall  . Abdominal Pain  . Back Pain     (Consider location/radiation/quality/duration/timing/severity/associated sxs/prior Treatment) HPI Comments: 75 year old female [facility] after falling from a treadmill. Patient states that it suddenly jerked and she flew frontwards. She did not strike her head. She does not have any neck pain. Denies any chest discomfort. Does complain of some lower lumbar spinal pain without radiation to her legs. Does note some bilateral knee pain but denies any hip discomfort. Also complains of some right upper quadrant abdominal discomfort. Pain is characterized as sharp and worse with movement better with remaining still. EMS was called and patient transferred here.  Patient is a 75 y.o. female presenting with fall, abdominal pain, and back pain. The history is provided by the patient.  Fall Associated symptoms include abdominal pain.  Abdominal Pain Back Pain Associated symptoms: abdominal pain     Past Medical History  Diagnosis Date  . Diabetes mellitus without complication (HCC)   . Hypertension    History reviewed. No pertinent past surgical history. History reviewed. No pertinent family history. Social History  Substance Use Topics  . Smoking status: Never Smoker   . Smokeless tobacco: None  . Alcohol Use: No   OB History    No data available     Review of Systems  Gastrointestinal: Positive for abdominal pain.  Musculoskeletal: Positive for back pain.  All other systems reviewed and are negative.     Allergies  Review of patient's allergies indicates no known allergies.  Home Medications   Prior to Admission medications   Medication Sig Start Date End Date Taking? Authorizing Provider  CALCIUM PO Take 1 tablet by mouth daily.    Historical Provider, MD    cholecalciferol (VITAMIN D) 1000 UNITS tablet Take 1,000 Units by mouth daily.    Historical Provider, MD  lisinopril (PRINIVIL,ZESTRIL) 10 MG tablet Take 10 mg by mouth daily.  10/25/14   Historical Provider, MD  metFORMIN (GLUCOPHAGE) 500 MG tablet Take 500 mg by mouth 2 (two) times daily with a meal.  08/23/14   Historical Provider, MD  simvastatin (ZOCOR) 10 MG tablet Take 10 mg by mouth daily.  10/14/14   Historical Provider, MD   BP 176/92 mmHg  Pulse 71  Temp(Src) 98 F (36.7 C) (Oral)  Resp 16  SpO2 100% Physical Exam  Constitutional: She is oriented to person, place, and time. She appears well-developed and well-nourished.  Non-toxic appearance. No distress.  HENT:  Head: Normocephalic and atraumatic.  Eyes: Conjunctivae, EOM and lids are normal. Pupils are equal, round, and reactive to light.  Neck: Normal range of motion. Neck supple. No tracheal deviation present. No thyroid mass present.  Cardiovascular: Normal rate, regular rhythm and normal heart sounds.  Exam reveals no gallop.   No murmur heard. Pulmonary/Chest: Effort normal and breath sounds normal. No stridor. No respiratory distress. She has no decreased breath sounds. She has no wheezes. She has no rhonchi. She has no rales.  Abdominal: Soft. Normal appearance and bowel sounds are normal. She exhibits no distension. There is no tenderness. There is no rebound and no CVA tenderness.    Musculoskeletal: Normal range of motion. She exhibits no edema or tenderness.       Back:       Legs:  Neurological: She is alert and oriented to person, place, and time. She has normal strength. No cranial nerve deficit or sensory deficit. GCS eye subscore is 4. GCS verbal subscore is 5. GCS motor subscore is 6.  Skin: Skin is warm and dry. No abrasion and no rash noted.  Psychiatric: She has a normal mood and affect. Her speech is normal and behavior is normal.  Nursing note and vitals reviewed.   ED Course  Procedures (including  critical care time) Labs Review Labs Reviewed  BASIC METABOLIC PANEL    Imaging Review No results found. I have personally reviewed and evaluated these images and lab results as part of my medical decision-making.   EKG Interpretation None      MDM   Final diagnoses:  None    Patient given pain meds and feels better. X-rays are without evidence of acute fracture. Stable for discharge    Lorre Nick, MD 11/28/15 1525

## 2016-01-20 ENCOUNTER — Other Ambulatory Visit: Payer: Self-pay | Admitting: Internal Medicine

## 2016-01-20 DIAGNOSIS — Z1231 Encounter for screening mammogram for malignant neoplasm of breast: Secondary | ICD-10-CM

## 2016-01-25 ENCOUNTER — Ambulatory Visit
Admission: RE | Admit: 2016-01-25 | Discharge: 2016-01-25 | Disposition: A | Discharge status: Home / Self Care | Payer: Medicare Other | Source: Ambulatory Visit | Attending: Internal Medicine | Admitting: Internal Medicine

## 2016-01-25 DIAGNOSIS — Z1231 Encounter for screening mammogram for malignant neoplasm of breast: Secondary | ICD-10-CM

## 2016-10-10 ENCOUNTER — Emergency Department (HOSPITAL_COMMUNITY)
Admission: EM | Admit: 2016-10-10 | Discharge: 2016-10-10 | Disposition: A | Discharge status: Home / Self Care | Payer: Medicare Other | Attending: Emergency Medicine | Admitting: Emergency Medicine

## 2016-10-10 ENCOUNTER — Encounter (HOSPITAL_COMMUNITY): Payer: Self-pay | Admitting: Emergency Medicine

## 2016-10-10 DIAGNOSIS — E119 Type 2 diabetes mellitus without complications: Secondary | ICD-10-CM | POA: Insufficient documentation

## 2016-10-10 DIAGNOSIS — Z7984 Long term (current) use of oral hypoglycemic drugs: Secondary | ICD-10-CM | POA: Insufficient documentation

## 2016-10-10 DIAGNOSIS — L03116 Cellulitis of left lower limb: Secondary | ICD-10-CM | POA: Diagnosis not present

## 2016-10-10 DIAGNOSIS — M1990 Unspecified osteoarthritis, unspecified site: Secondary | ICD-10-CM

## 2016-10-10 DIAGNOSIS — I10 Essential (primary) hypertension: Secondary | ICD-10-CM | POA: Insufficient documentation

## 2016-10-10 DIAGNOSIS — M79672 Pain in left foot: Secondary | ICD-10-CM | POA: Diagnosis present

## 2016-10-10 DIAGNOSIS — M1380 Other specified arthritis, unspecified site: Secondary | ICD-10-CM | POA: Insufficient documentation

## 2016-10-10 MED ORDER — CEPHALEXIN 500 MG PO CAPS
500.0000 mg | ORAL_CAPSULE | Freq: Once | ORAL | Status: AC
  Administered 2016-10-10: 500 mg via ORAL
  Filled 2016-10-10: qty 1

## 2016-10-10 MED ORDER — CEPHALEXIN 500 MG PO CAPS: 500.0000 mg | ORAL_CAPSULE | Freq: Four times a day (QID) | ORAL | 0 refills | Status: DC

## 2016-10-10 MED ORDER — IBUPROFEN 400 MG PO TABS: 400.0000 mg | ORAL_TABLET | Freq: Three times a day (TID) | ORAL | 0 refills | Status: DC | PRN

## 2016-10-10 MED ORDER — IBUPROFEN 200 MG PO TABS
400.0000 mg | ORAL_TABLET | Freq: Once | ORAL | Status: AC
  Administered 2016-10-10: 400 mg via ORAL
  Filled 2016-10-10: qty 2

## 2016-10-10 NOTE — ED Triage Notes (Addendum)
Pt with edema, redness to L foot. Pt c/o pain to L foot, pt states she has had painful bunions in the past but edema and redness is new for patient. Pt with difficulty ambulating. Pulses present. Pt denies injury.

## 2016-10-10 NOTE — ED Provider Notes (Signed)
[facility]-EMERGENCY DEPT Provider Note   CSN: [id number] Arrival date & time: 10/10/16  1133     History   Chief Complaint Chief Complaint  Patient presents with  . Foot Pain  . Foot Swelling    HPI Pamela Key is a 76 y.o. female.  Patient c/o left foot redness, pain and swelling for the past day.  Symptoms moderate, persistent, constant, worse w palpation. Denies hx same. 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 Denies trauma to foot. No breaks in skin/lacs/abrasions.   No fever or chills. Does not feel sick or ill. No nv. No malaise.    The history is provided by the patient.  Foot Pain     Past Medical History:  Diagnosis Date  . Diabetes mellitus without complication (HCC)   . Hypertension     There are no active problems to display for this patient.   History reviewed. No pertinent surgical history.  OB History    No data available       Home Medications    Prior to Admission medications   Medication Sig Start Date End Date Taking? Authorizing Provider  lisinopril (PRINIVIL,ZESTRIL) 10 MG tablet Take 10 mg by mouth daily with breakfast.  10/25/14  Yes [provider]  metFORMIN (GLUCOPHAGE) 500 MG tablet Take 500 mg by mouth 2 (two) times daily with a meal.  08/23/14  Yes [provider]  Multiple Vitamin (MULTIVITAMIN WITH MINERALS) TABS tablet Take 1 tablet by mouth daily with breakfast.   Yes [provider]  HYDROcodone-acetaminophen (NORCO/VICODIN) 5-325 MG tablet Take 1-2 tablets by mouth every 4 (four) hours as needed. Patient not taking: Reported on 10/10/2016 11/28/15   Lorre NickAllen, Anthony, MD  methocarbamol (ROBAXIN) 500 MG tablet Take 1 tablet (500 mg total) by mouth 2 (two) times daily. Patient not taking: Reported on 10/10/2016 11/28/15   Lorre NickAllen, Anthony, MD    Family History History reviewed. No pertinent family history.  Social History Social History  Substance Use Topics  . Smoking status: Never Smoker  . Smokeless tobacco: Never  Used  . Alcohol use No     Allergies   Patient has no known allergies.   Review of Systems Review of Systems  Constitutional: Negative for chills and fever.  Skin: Negative for wound.  Neurological: Negative for numbness.     Physical Exam Updated Vital Signs BP (!) 165/67 (BP Location: Right Arm)   Pulse 77   Temp 98.7 F (37.1 C) (Oral)   Resp 14   Ht 1.626 m (5\' 4" )   Wt 93 kg (205 lb)   SpO2 100%   BMI 35.19 kg/m   Physical Exam  Constitutional: She appears well-developed and well-nourished. No distress.  Eyes: Conjunctivae are normal. No scleral icterus.  Neck: Neck supple. No tracheal deviation present.  Cardiovascular: Normal rate and intact distal pulses.   Pulmonary/Chest: Effort normal. No respiratory distress.  Abdominal: Normal appearance.  Musculoskeletal:  Erythema, swelling, to dorsum left foot esp in region 1st MTP. Skin is intact. Dp/pt 2+. Normal cap refill distally in toes.  Neurological: She is alert.  Skin: Skin is warm and dry. No rash noted.  Psychiatric: She has a normal mood and affect.  Nursing note and vitals reviewed.    ED Treatments / Results  Labs (all labs ordered are listed, but only abnormal results are displayed) Labs Reviewed - No data to display  EKG  EKG Interpretation None       Radiology No results found.  Procedures  Procedures (including critical care time)  Medications Ordered in ED Medications  ibuprofen (ADVIL,MOTRIN) tablet 400 mg (not administered)  cephALEXin (KEFLEX) capsule 500 mg (not administered)     Initial Impression / Assessment and Plan / ED Course  I have reviewed the triage vital signs and the nursing notes.  Pertinent labs & imaging results that were available during my care of the patient were reviewed by me and considered in my medical decision making (see chart for details).  Given remote hx gout, feel that most likely, however cannot definitively r/o cellulitis.    Motrin po.   Keflex po.  Pt overall looks good, does not feel ill, no systemic signs of infection.  Patient currently appears stable for d/c.     Final Clinical Impressions(s) / ED Diagnoses   Final diagnoses:  None    New Prescriptions New Prescriptions   No medications on file     Cathren Laine, MD 10/10/16 1355

## 2016-10-10 NOTE — Discharge Instructions (Signed)
It was our pleasure to provide your ER care today - we hope that you feel better.  Elevate foot to help with pain and swelling. Rest.   Take motrin as need for pain/inflammation.  Take keflex as prescribed.  You may also take acetaminophen as need for pain.  Follow up with primary care doctor in 1 week if symptoms fail to improve/resolve.  Return to ER if worse, fevers, spreading redness, severe pain, other concern.

## 2016-10-10 NOTE — ED Notes (Signed)
PT DISCHARGED. INSTRUCTIONS AND PRESCRIPTIONS GIVEN. AAOX4. PT IN NO APPARENT DISTRESS WITH MODERATE PAIN. THE OPPORTUNITY TO ASK QUESTIONS WAS PROVIDED. 

## 2017-01-15 DIAGNOSIS — R69 Illness, unspecified: Secondary | ICD-10-CM | POA: Diagnosis not present

## 2017-01-17 DIAGNOSIS — E119 Type 2 diabetes mellitus without complications: Secondary | ICD-10-CM | POA: Diagnosis not present

## 2017-02-07 NOTE — Congregational Nurse Program (Signed)
Congregational Nurse Program Note  Date of Encounter: 02/07/2017  Past Medical History: Past Medical History:  Diagnosis Date  . Diabetes mellitus without complication (HCC)   . Hypertension     Encounter Details:     CNP Questionnaire - 02/07/17 1427      Patient Demographics   Is this a new or existing patient? New   Patient is considered a/an Not Applicable   Race African-American/Black     Patient Assistance   Location of Patient Assistance Not Applicable   Patient's financial/insurance status Medicare   Uninsured Patient ([place] [employer]/Care Connects) No   Patient referred to apply for the following financial assistance Not Applicable   Food insecurities addressed Not Applicable   Transportation assistance No   Assistance securing medications No   Educational health offerings Exercise/physical activity     Encounter Details   Primary purpose of visit Education/Health Concerns   Was an Emergency Department visit averted? Not Applicable   Does patient have a medical provider? Yes   Patient referred to Not Applicable   Was a mental health screening completed? (GAINS tool) No   Does patient have dental issues? No   Does patient have vision issues? No   Does your patient have an abnormal blood pressure today? No   Since previous encounter, have you referred patient for abnormal blood pressure that resulted in a new diagnosis or medication change? No   Does your patient have an abnormal blood glucose today? No   Since previous encounter, have you referred patient for abnormal blood glucose that resulted in a new diagnosis or medication change? No   Was there a life-saving intervention made? No    Attended chair yoga class.

## 2017-02-11 DIAGNOSIS — R69 Illness, unspecified: Secondary | ICD-10-CM | POA: Diagnosis not present

## 2017-02-13 DIAGNOSIS — N08 Glomerular disorders in diseases classified elsewhere: Secondary | ICD-10-CM | POA: Diagnosis not present

## 2017-02-13 DIAGNOSIS — Z23 Encounter for immunization: Secondary | ICD-10-CM | POA: Diagnosis not present

## 2017-02-13 DIAGNOSIS — E1122 Type 2 diabetes mellitus with diabetic chronic kidney disease: Secondary | ICD-10-CM | POA: Diagnosis not present

## 2017-02-13 DIAGNOSIS — Z79899 Other long term (current) drug therapy: Secondary | ICD-10-CM | POA: Diagnosis not present

## 2017-02-13 DIAGNOSIS — N182 Chronic kidney disease, stage 2 (mild): Secondary | ICD-10-CM | POA: Diagnosis not present

## 2017-02-13 DIAGNOSIS — Z0000 Encounter for general adult medical examination without abnormal findings: Secondary | ICD-10-CM | POA: Diagnosis not present

## 2017-02-13 DIAGNOSIS — E559 Vitamin D deficiency, unspecified: Secondary | ICD-10-CM | POA: Diagnosis not present

## 2017-02-13 DIAGNOSIS — I129 Hypertensive chronic kidney disease with stage 1 through stage 4 chronic kidney disease, or unspecified chronic kidney disease: Secondary | ICD-10-CM | POA: Diagnosis not present

## 2017-02-25 ENCOUNTER — Other Ambulatory Visit: Payer: Self-pay | Admitting: Internal Medicine

## 2017-02-25 DIAGNOSIS — Z1231 Encounter for screening mammogram for malignant neoplasm of breast: Secondary | ICD-10-CM

## 2017-02-25 DIAGNOSIS — E2839 Other primary ovarian failure: Secondary | ICD-10-CM

## 2017-03-08 DIAGNOSIS — R69 Illness, unspecified: Secondary | ICD-10-CM | POA: Diagnosis not present

## 2017-03-13 ENCOUNTER — Ambulatory Visit
Admission: RE | Admit: 2017-03-13 | Discharge: 2017-03-13 | Disposition: A | Discharge status: Home / Self Care | Payer: Medicare HMO | Source: Ambulatory Visit | Attending: Internal Medicine | Admitting: Internal Medicine

## 2017-03-13 DIAGNOSIS — Z1231 Encounter for screening mammogram for malignant neoplasm of breast: Secondary | ICD-10-CM

## 2017-03-13 DIAGNOSIS — Z13820 Encounter for screening for osteoporosis: Secondary | ICD-10-CM | POA: Diagnosis not present

## 2017-03-13 DIAGNOSIS — Z780 Asymptomatic menopausal state: Secondary | ICD-10-CM | POA: Diagnosis not present

## 2017-03-13 DIAGNOSIS — E2839 Other primary ovarian failure: Secondary | ICD-10-CM

## 2017-03-14 DIAGNOSIS — H40013 Open angle with borderline findings, low risk, bilateral: Secondary | ICD-10-CM | POA: Diagnosis not present

## 2017-03-14 DIAGNOSIS — H2513 Age-related nuclear cataract, bilateral: Secondary | ICD-10-CM | POA: Diagnosis not present

## 2017-03-14 DIAGNOSIS — E119 Type 2 diabetes mellitus without complications: Secondary | ICD-10-CM | POA: Diagnosis not present

## 2017-03-19 NOTE — Congregational Nurse Program (Signed)
Congregational Nurse Program Note  Date of Encounter: 03/19/2017  Past Medical History: Past Medical History:  Diagnosis Date  . Diabetes mellitus without complication (HCC)   . Hypertension     Encounter Details: CNP Questionnaire - 03/19/17 1444      Questionnaire   Patient Status  Not Applicable    Race  Black or African American    Location Patient Served At  Not Applicable    Insurance  Medicare    Uninsured  Not Applicable    Food  No food insecurities    Housing/Utilities  Yes, have permanent housing    Transportation  Yes, need transportation assistance    Interpersonal Safety  Yes, feel physically and emotionally safe where you currently live    Medication  No medication insecurities    Medical Provider  Yes    Referrals  Not Applicable    ED Visit Averted  Not Applicable    Life-Saving Intervention Made  Not Applicable     Pamela KraftSharon Colten Desroches, CNP, [phone].

## 2017-03-27 DIAGNOSIS — N182 Chronic kidney disease, stage 2 (mild): Secondary | ICD-10-CM | POA: Diagnosis not present

## 2017-03-27 DIAGNOSIS — R69 Illness, unspecified: Secondary | ICD-10-CM | POA: Diagnosis not present

## 2017-03-27 DIAGNOSIS — I129 Hypertensive chronic kidney disease with stage 1 through stage 4 chronic kidney disease, or unspecified chronic kidney disease: Secondary | ICD-10-CM | POA: Diagnosis not present

## 2017-04-15 DIAGNOSIS — R69 Illness, unspecified: Secondary | ICD-10-CM | POA: Diagnosis not present

## 2017-04-18 DIAGNOSIS — E119 Type 2 diabetes mellitus without complications: Secondary | ICD-10-CM | POA: Diagnosis not present

## 2017-05-13 DIAGNOSIS — R69 Illness, unspecified: Secondary | ICD-10-CM | POA: Diagnosis not present

## 2017-06-13 NOTE — Congregational Nurse Program (Signed)
Congregational Nurse Program Note  Date of Encounter: 06/13/2017  Past Medical History: Past Medical History:  Diagnosis Date  . Diabetes mellitus without complication (HCC)   . Hypertension     Encounter Details:

## 2017-07-18 DIAGNOSIS — E119 Type 2 diabetes mellitus without complications: Secondary | ICD-10-CM | POA: Diagnosis not present

## 2017-08-15 DIAGNOSIS — E119 Type 2 diabetes mellitus without complications: Secondary | ICD-10-CM | POA: Diagnosis not present

## 2017-08-23 DIAGNOSIS — M79671 Pain in right foot: Secondary | ICD-10-CM | POA: Diagnosis not present

## 2017-08-23 DIAGNOSIS — E1122 Type 2 diabetes mellitus with diabetic chronic kidney disease: Secondary | ICD-10-CM | POA: Diagnosis not present

## 2017-08-23 DIAGNOSIS — I129 Hypertensive chronic kidney disease with stage 1 through stage 4 chronic kidney disease, or unspecified chronic kidney disease: Secondary | ICD-10-CM | POA: Diagnosis not present

## 2017-08-23 DIAGNOSIS — N08 Glomerular disorders in diseases classified elsewhere: Secondary | ICD-10-CM | POA: Diagnosis not present

## 2017-08-23 DIAGNOSIS — N182 Chronic kidney disease, stage 2 (mild): Secondary | ICD-10-CM | POA: Diagnosis not present

## 2017-10-01 ENCOUNTER — Ambulatory Visit: Payer: Self-pay | Admitting: Sports Medicine

## 2017-10-01 DIAGNOSIS — E119 Type 2 diabetes mellitus without complications: Secondary | ICD-10-CM | POA: Diagnosis not present

## 2017-10-01 DIAGNOSIS — H40013 Open angle with borderline findings, low risk, bilateral: Secondary | ICD-10-CM | POA: Diagnosis not present

## 2017-10-01 DIAGNOSIS — H2513 Age-related nuclear cataract, bilateral: Secondary | ICD-10-CM | POA: Diagnosis not present

## 2017-10-22 DIAGNOSIS — E119 Type 2 diabetes mellitus without complications: Secondary | ICD-10-CM | POA: Diagnosis not present

## 2017-11-01 DIAGNOSIS — M545 Low back pain: Secondary | ICD-10-CM | POA: Diagnosis not present

## 2017-11-15 DIAGNOSIS — E119 Type 2 diabetes mellitus without complications: Secondary | ICD-10-CM | POA: Diagnosis not present

## 2017-11-21 DIAGNOSIS — I129 Hypertensive chronic kidney disease with stage 1 through stage 4 chronic kidney disease, or unspecified chronic kidney disease: Secondary | ICD-10-CM | POA: Diagnosis not present

## 2017-11-21 DIAGNOSIS — N08 Glomerular disorders in diseases classified elsewhere: Secondary | ICD-10-CM | POA: Diagnosis not present

## 2017-11-21 DIAGNOSIS — M109 Gout, unspecified: Secondary | ICD-10-CM | POA: Diagnosis not present

## 2017-11-21 DIAGNOSIS — N182 Chronic kidney disease, stage 2 (mild): Secondary | ICD-10-CM | POA: Diagnosis not present

## 2017-11-21 DIAGNOSIS — E1122 Type 2 diabetes mellitus with diabetic chronic kidney disease: Secondary | ICD-10-CM | POA: Diagnosis not present

## 2017-11-21 LAB — BASIC METABOLIC PANEL
BUN: 11 (ref 4–21)
Creatinine: 0.9 (ref 0.5–1.1)
Glucose: 116
Potassium: 3.8 (ref 3.4–5.3)
Sodium: 147 (ref 137–147)

## 2017-11-21 LAB — HEMOGLOBIN A1C: Hemoglobin A1C: 6

## 2018-01-21 DIAGNOSIS — E119 Type 2 diabetes mellitus without complications: Secondary | ICD-10-CM | POA: Diagnosis not present

## 2018-02-11 ENCOUNTER — Other Ambulatory Visit: Payer: Self-pay | Admitting: Internal Medicine

## 2018-02-17 DIAGNOSIS — E119 Type 2 diabetes mellitus without complications: Secondary | ICD-10-CM | POA: Diagnosis not present

## 2018-03-01 ENCOUNTER — Encounter: Payer: Self-pay | Admitting: Internal Medicine

## 2018-03-01 DIAGNOSIS — N182 Chronic kidney disease, stage 2 (mild): Secondary | ICD-10-CM | POA: Insufficient documentation

## 2018-03-01 DIAGNOSIS — I129 Hypertensive chronic kidney disease with stage 1 through stage 4 chronic kidney disease, or unspecified chronic kidney disease: Secondary | ICD-10-CM | POA: Insufficient documentation

## 2018-03-01 HISTORY — DX: Hypertensive chronic kidney disease with stage 1 through stage 4 chronic kidney disease, or unspecified chronic kidney disease: I12.9

## 2018-03-13 ENCOUNTER — Encounter: Payer: Self-pay | Admitting: Internal Medicine

## 2018-03-13 ENCOUNTER — Ambulatory Visit (INDEPENDENT_AMBULATORY_CARE_PROVIDER_SITE_OTHER): Payer: Medicare HMO | Admitting: Internal Medicine

## 2018-03-13 ENCOUNTER — Ambulatory Visit (INDEPENDENT_AMBULATORY_CARE_PROVIDER_SITE_OTHER): Payer: Medicare HMO

## 2018-03-13 VITALS — BP 142/82 | HR 67 | Temp 98.1°F | Ht 62.0 in | Wt 178.8 lb

## 2018-03-13 DIAGNOSIS — Z0000 Encounter for general adult medical examination without abnormal findings: Secondary | ICD-10-CM | POA: Diagnosis not present

## 2018-03-13 DIAGNOSIS — E1165 Type 2 diabetes mellitus with hyperglycemia: Secondary | ICD-10-CM

## 2018-03-13 DIAGNOSIS — I10 Essential (primary) hypertension: Secondary | ICD-10-CM | POA: Diagnosis not present

## 2018-03-13 DIAGNOSIS — E1122 Type 2 diabetes mellitus with diabetic chronic kidney disease: Secondary | ICD-10-CM | POA: Insufficient documentation

## 2018-03-13 DIAGNOSIS — IMO0002 Reserved for concepts with insufficient information to code with codable children: Secondary | ICD-10-CM | POA: Insufficient documentation

## 2018-03-13 DIAGNOSIS — E119 Type 2 diabetes mellitus without complications: Secondary | ICD-10-CM | POA: Insufficient documentation

## 2018-03-13 LAB — POCT UA - MICROALBUMIN
Albumin/Creatinine Ratio, Urine, POC: <30
Creatinine, POC: 300 mg/dL
Microalbumin Ur, POC: 80 mg/L

## 2018-03-13 LAB — POCT URINALYSIS DIPSTICK
Glucose, UA: NEGATIVE
Ketones, UA: NEGATIVE
Nitrite, UA: NEGATIVE
Protein, UA: NEGATIVE
Spec Grav, UA: 1.025 (ref 1.010–1.025)
Urobilinogen, UA: 0.2 E.U./dL
pH, UA: 5 (ref 5.0–8.0)

## 2018-03-13 NOTE — Progress Notes (Signed)
Subjective:   Pamela Key is a 77 y.o. female who presents for Medicare Annual (Subsequent) preventive examination.  Review of Systems:  n/a Cardiac Risk Factors include: advanced age (>31men, >36 women);diabetes mellitus;obesity (BMI >30kg/m2)     Objective:     Vitals: BP (!) 142/82 (BP Location: Left Arm)   Pulse 67   Temp 98.1 F (36.7 C)   Ht 5\' 2"  (1.575 m)   Wt 178 lb 12.8 oz (81.1 kg)   BMI 32.70 kg/m   Body mass index is 32.7 kg/m.  Advanced Directives 03/13/2018 10/10/2016 11/28/2015 10/28/2014  Does Patient Have a Medical Advance Directive? No No No No  Would patient like information on creating a medical advance directive? No - Patient declined - No - patient declined information -    Tobacco Social History   Tobacco Use  Smoking Status Former Smoker  . Last attempt to quit: 1976  . Years since quitting: 43.8  Smokeless Tobacco Never Used     Counseling given: Not Answered   Clinical Intake:  Pre-visit preparation completed: Yes  Pain : No/denies pain Pain Score: 0-No pain     Nutritional Status: BMI > 30  Obese Nutritional Risks: None Diabetes: Yes CBG done?: No Did pt. bring in CBG monitor from home?: No  How often do you need to have someone help you when you read instructions, pamphlets, or other written materials from your doctor or pharmacy?: 1 - Never What is the last grade level you completed in school?: Master Degree  Interpreter Needed?: No  Information entered by :: Freida Busman LPN  Past Medical History:  Diagnosis Date  . Diabetes mellitus without complication (HCC)   . Hypertension    Past Surgical History:  Procedure Laterality Date  . TUBAL LIGATION  1976   Family History  Problem Relation Age of Onset  . Diabetes Mother   . Hypertension Mother   . Cancer Father   . Hypertension Father    Social History   Socioeconomic History  . Marital status: Divorced    Spouse name: Not on file  . Number of children: Not  on file  . Years of education: Not on file  . Highest education level: Not on file  Occupational History  . Occupation: retired  Engineer, production  . Financial resource strain: Not hard at all  . Food insecurity:    Worry: Never true    Inability: Never true  . Transportation needs:    Medical: No    Non-medical: No  Tobacco Use  . Smoking status: Former Smoker    Last attempt to quit: 1976    Years since quitting: 43.8  . Smokeless tobacco: Never Used  Substance and Sexual Activity  . Alcohol use: Yes    Comment: on occassion  . Drug use: No  . Sexual activity: Not Currently  Lifestyle  . Physical activity:    Days per week: 4 days    Minutes per session: 30 min  . Stress: Not at all  Relationships  . Social connections:    Talks on phone: Not on file    Gets together: Not on file    Attends religious service: Not on file    Active member of club or organization: Not on file    Attends meetings of clubs or organizations: Not on file    Relationship status: Not on file  Other Topics Concern  . Not on file  Social History Narrative  . Not on file  Outpatient Encounter Medications as of 03/13/2018  Medication Sig  . lisinopril (PRINIVIL,ZESTRIL) 20 MG tablet Take 1 tablet by mouth daily.  . metFORMIN (GLUCOPHAGE) 500 MG tablet Take 1 tablet by mouth twice daily.  . Multiple Vitamin (MULTIVITAMIN WITH MINERALS) TABS tablet Take 1 tablet by mouth daily with breakfast.  . pravastatin (PRAVACHOL) 40 MG tablet Take 40 mg by mouth daily.  . [DISCONTINUED] ibuprofen (ADVIL,MOTRIN) 400 MG tablet Take 1 tablet (400 mg total) by mouth every 8 (eight) hours as needed. Take with food.  . [DISCONTINUED] cephALEXin (KEFLEX) 500 MG capsule Take 1 capsule (500 mg total) by mouth 4 (four) times daily. (Patient not taking: Reported on 03/13/2018)  . [DISCONTINUED] colchicine (COLCRYS) 0.6 MG tablet Take 0.6 mg by mouth daily.  . [DISCONTINUED] HYDROcodone-acetaminophen (NORCO/VICODIN)  5-325 MG tablet Take 1-2 tablets by mouth every 4 (four) hours as needed. (Patient not taking: Reported on 10/10/2016)  . [DISCONTINUED] lisinopril (PRINIVIL,ZESTRIL) 10 MG tablet Take 10 mg by mouth daily with breakfast.   . [DISCONTINUED] methocarbamol (ROBAXIN) 500 MG tablet Take 1 tablet (500 mg total) by mouth 2 (two) times daily. (Patient not taking: Reported on 10/10/2016)   No facility-administered encounter medications on file as of 03/13/2018.     Activities of Daily Living In your present state of health, do you have any difficulty performing the following activities: 03/13/2018  Hearing? N  Vision? N  Difficulty concentrating or making decisions? N  Walking or climbing stairs? N  Dressing or bathing? N  Doing errands, shopping? N  Preparing Food and eating ? N  Using the Toilet? N  In the past six months, have you accidently leaked urine? N  Do you have problems with loss of bowel control? N  Managing your Medications? N  Managing your Finances? N  Housekeeping or managing your Housekeeping? N  Some recent data might be hidden    Patient Care Team: [name], MD as PCP - General (Internal Medicine)    Assessment:   This is a routine wellness examination for [name].  Exercise Activities and Dietary recommendations Current Exercise Habits: Structured exercise class, Type of exercise: yoga;walking, Time (Minutes): 30, Frequency (Times/Week): 4, Weekly Exercise (Minutes/Week): 120, Intensity: Moderate  Goals   None     Fall Risk Fall Risk  03/13/2018  Risk for fall due to : Medication side effect   Is the patient's home free of loose throw rugs in walkways, pet beds, electrical cords, etc?   yes      Grab bars in the bathroom? yes      Handrails on the stairs?   yes      Adequate lighting?   yes  Timed Get Up and Go performed: n/a  Depression Screen PHQ 2/9 Scores 03/13/2018  PHQ - 2 Score 0     Cognitive Function     6CIT Screen 03/13/2018  What  Year? 0 points  What month? 0 points  What time? 0 points  Count back from 20 0 points  Months in reverse 0 points  Repeat phrase 0 points  Total Score 0     There is no immunization history on file for this patient.  Qualifies for Shingles Vaccine? yes  Screening Tests Health Maintenance  Topic Date Due  . FOOT EXAM  11/25/1950  . PNA vac Low Risk Adult (1 of 2 - PCV13) 11/26/2007  . INFLUENZA VACCINE  12/12/2017  . HEMOGLOBIN A1C  05/24/2018  . OPHTHALMOLOGY EXAM  12/18/2018  . COLONOSCOPY  09/19/2020  . TETANUS/TDAP  02/10/2023  . DEXA SCAN  Completed    Cancer Screenings: Lung: Low Dose CT Chest recommended if Age 28-80 years, 30 pack-year currently smoking OR have quit w/in 15years. Patient does not qualify. Breast:  Up to date on Mammogram? Yes   Up to date of Bone Density/Dexa? Yes Colorectal: not  required  Additional Screenings: : Hepatitis C Screening: n/a     Plan:    Patient does not have any goals for herself this year.  Patient exercises 4 times a week and plans to start going to the [facility].  I have personally reviewed and noted the following in the patient's chart:   . Medical and social history . Use of alcohol, tobacco or illicit drugs  . Current medications and supplements . Functional ability and status . Nutritional status . Physical activity . Advanced directives . List of other physicians . Hospitalizations, surgeries, and ER visits in previous 12 months . Vitals . Screenings to include cognitive, depression, and falls . Referrals and appointments  In addition, I have reviewed and discussed with patient certain preventive protocols, quality metrics, and best practice recommendations. A written personalized care plan for preventive services as well as general preventive health recommendations were provided to patient.     Barb Merino, LPN  16/02/9603

## 2018-03-13 NOTE — Progress Notes (Signed)
Subjective:     Patient ID: Pamela Key , female    DOB: 12-26-1940 , 77 y.o.   MRN: [id number]   No chief complaint on file. HPI Pt is here for DM FU. Her glucose have been on average around low 100's.  Her Bps at home 110-120 systolic. Tends to have elevation of BP when she comes to the office.    Past Medical History:  Diagnosis Date  . Diabetes mellitus without complication (HCC)   . Hypertension      Family History  Problem Relation Age of Onset  . Diabetes Mother   . Hypertension Mother   . Cancer Father   . Hypertension Father      Current Outpatient Medications:  .  cephALEXin (KEFLEX) 500 MG capsule, Take 1 capsule (500 mg total) by mouth 4 (four) times daily. (Patient not taking: Reported on 03/13/2018), Disp: 28 capsule, Rfl: 0 .  colchicine (COLCRYS) 0.6 MG tablet, Take 0.6 mg by mouth daily., Disp: , Rfl:  .  HYDROcodone-acetaminophen (NORCO/VICODIN) 5-325 MG tablet, Take 1-2 tablets by mouth every 4 (four) hours as needed. (Patient not taking: Reported on 10/10/2016), Disp: 15 tablet, Rfl: 0 .  ibuprofen (ADVIL,MOTRIN) 400 MG tablet, Take 1 tablet (400 mg total) by mouth every 8 (eight) hours as needed. Take with food., Disp: 15 tablet, Rfl: 0 .  lisinopril (PRINIVIL,ZESTRIL) 10 MG tablet, Take 10 mg by mouth daily with breakfast. , Disp: , Rfl:  .  lisinopril (PRINIVIL,ZESTRIL) 20 MG tablet, Take 1 tablet by mouth daily., Disp: 90 tablet, Rfl: 1 .  metFORMIN (GLUCOPHAGE) 500 MG tablet, Take 1 tablet by mouth twice daily., Disp: 180 tablet, Rfl: 1 .  methocarbamol (ROBAXIN) 500 MG tablet, Take 1 tablet (500 mg total) by mouth 2 (two) times daily. (Patient not taking: Reported on 10/10/2016), Disp: 20 tablet, Rfl: 0 .  Multiple Vitamin (MULTIVITAMIN WITH MINERALS) TABS tablet, Take 1 tablet by mouth daily with breakfast., Disp: , Rfl:  .  pravastatin (PRAVACHOL) 40 MG tablet, Take 40 mg by mouth daily., Disp: , Rfl:    No Known Allergies   Review of Systems   Constitutional: Negative for chills, diaphoresis and fever.  HENT: Negative for tinnitus.   Respiratory: Negative for cough and shortness of breath.   Cardiovascular: Negative for chest pain, palpitations and leg swelling.  Gastrointestinal: Negative for constipation, diarrhea, nausea and vomiting.  Endocrine: Negative for polydipsia and polyphagia.  Genitourinary: Negative for dysuria and frequency.  Skin: Negative for rash and wound.     Today's Vitals   03/13/18 1457  BP: (!) 142/82  Pulse: 67  Temp: 98.1 F (36.7 C)  TempSrc: Oral  Weight: 178 lb 12.8 oz (81.1 kg)  Height: 5\' 2"  (1.575 m)   Body mass index is 32.7 kg/m.   Objective:  Physical Exam  Constitutional: She is oriented to person, place, and time. She appears well-developed and well-nourished. No distress.  HENT:  Head: Normocephalic and atraumatic.  Right Ear: External ear normal.  Left Ear: External ear normal.  Nose: Nose normal.  Eyes: Conjunctivae are normal. Right eye exhibits no discharge. Left eye exhibits no discharge. No scleral icterus.  Neck: Neck supple. No thyromegaly present.  No carotid bruits bilaterally  Cardiovascular: Normal rate and regular rhythm.  No murmur heard. Pulmonary/Chest: Effort normal and breath sounds normal. No respiratory distress.  Musculoskeletal: Normal range of motion. She exhibits no edema.  Lymphadenopathy:    She has no cervical adenopathy.  Neurological: She  is alert and oriented to person, place, and time.  Skin: Skin is warm and dry. Capillary refill takes less than 2 seconds. No rash noted. She is not diaphoretic.  Psychiatric: She has a normal mood and affect. Her behavior is normal. Judgment and thought content normal.  Nursing note reviewed.  Assessment And Plan:    1. 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 Essential hypertension- chronic - CMP14 + Anion Gap - CBC no Diff  May continue current  meds or now Until I receive her HGBA1C results before we make any changes for DM FU in 3 months.     Shamekia Tippets RODRIGUEZ-SOUTHWORTH, PA-C

## 2018-03-13 NOTE — Addendum Note (Signed)
Addended by: Mariam Dollar on: 03/13/2018 04:05 PM   Modules accepted: Orders

## 2018-03-13 NOTE — Patient Instructions (Signed)
Pamela Key , Thank you for taking time to come for your Medicare Wellness Visit. I appreciate your ongoing commitment to your health goals. Please review the following plan we discussed and let me know if I can assist you in the future.   Screening recommendations/referrals: Colonoscopy: up to date Mammogram: up to date Bone Density: up to date Recommended yearly ophthalmology/optometry visit for glaucoma screening and checkup Recommended yearly dental visit for hygiene and checkup  Vaccinations: Influenza vaccine: unsure Pneumococcal vaccine: 05/2010 Tdap vaccine: 01/2013 Shingles vaccine: decline    Advanced directives: Advance directive discussed with you today. Even though you declined this today please call our office should you change your mind and we can give you the proper paperwork for you to fill out.   Conditions/risks identified: Obesity: patient currently exercises 4 times a week.  States she will soon be starting going to the [facility] as well.  Next appointment: 03/13/2018   Preventive Care 65 Years and Older, Female Preventive care refers to lifestyle choices and visits with your health care provider that can promote health and wellness. What does preventive care include?  A yearly physical exam. This is also called an annual well check.  Dental exams once or twice a year.  Routine eye exams. Ask your health care provider how often you should have your eyes checked.  Personal lifestyle choices, including:  Daily care of your teeth and gums.  Regular physical activity.  Eating a healthy diet.  Avoiding tobacco and drug use.  Limiting alcohol use.  Practicing safe sex.  Taking low-dose aspirin every day.  Taking vitamin and mineral supplements as recommended by your health care provider. What happens during an annual well check? The services and screenings done by your health care provider during your annual well check will depend on your age, overall  health, lifestyle risk factors, and family history of disease. Counseling  Your health care provider may ask you questions about your:  Alcohol use.  Tobacco use.  Drug use.  Emotional well-being.  Home and relationship well-being.  Sexual activity.  Eating habits.  History of falls.  Memory and ability to understand (cognition).  Work and work Astronomer.  Reproductive health. Screening  You may have the following tests or measurements:  Height, weight, and BMI.  Blood pressure.  Lipid and cholesterol levels. These may be checked every 5 years, or more frequently if you are over 53 years old.  Skin check.  Lung cancer screening. You may have this screening every year starting at age 77 if you have a 30-pack-year history of smoking and currently smoke or have quit within the past 15 years.  Fecal occult blood test (FOBT) of the stool. You may have this test every year starting at age 64.  Flexible sigmoidoscopy or colonoscopy. You may have a sigmoidoscopy every 5 years or a colonoscopy every 10 years starting at age 52.  Hepatitis C blood test.  Hepatitis B blood test.  Sexually transmitted disease (STD) testing.  Diabetes screening. This is done by checking your blood sugar (glucose) after you have not eaten for a while (fasting). You may have this done every 1-3 years.  Bone density scan. This is done to screen for osteoporosis. You may have this done starting at age 58.  Mammogram. This may be done every 1-2 years. Talk to your health care provider about how often you should have regular mammograms. Talk with your health care provider about your test results, treatment options, and if necessary,  the need for more tests. Vaccines  Your health care provider may recommend certain vaccines, such as:  Influenza vaccine. This is recommended every year.  Tetanus, diphtheria, and acellular pertussis (Tdap, Td) vaccine. You may need a Td booster every 10  years.  Zoster vaccine. You may need this after age 66.  Pneumococcal 13-valent conjugate (PCV13) vaccine. One dose is recommended after age 8.  Pneumococcal polysaccharide (PPSV23) vaccine. One dose is recommended after age 75. Talk to your health care provider about which screenings and vaccines you need and how often you need them. This information is not intended to replace advice given to you by your health care provider. Make sure you discuss any questions you have with your health care provider. Document Released: 05/27/2015 Document Revised: 01/18/2016 Document Reviewed: 03/01/2015 Elsevier Interactive Patient Education  2017 [employer] Prevention in the Home Falls can cause injuries. They can happen to people of all ages. There are many things you can do to make your home safe and to help prevent falls. What can I do on the outside of my home?  Regularly fix the edges of walkways and driveways and fix any cracks.  Remove anything that might make you trip as you walk through a door, such as a raised step or threshold.  Trim any bushes or trees on the path to your home.  Use bright outdoor lighting.  Clear any walking paths of anything that might make someone trip, such as rocks or tools.  Regularly check to see if handrails are loose or broken. Make sure that both sides of any steps have handrails.  Any raised decks and porches should have guardrails on the edges.  Have any leaves, snow, or ice cleared regularly.  Use sand or salt on walking paths during winter.  Clean up any spills in your garage right away. This includes oil or grease spills. What can I do in the bathroom?  Use night lights.  Install grab bars by the toilet and in the tub and shower. Do not use towel bars as grab bars.  Use non-skid mats or decals in the tub or shower.  If you need to sit down in the shower, use a plastic, non-slip stool.  Keep the floor dry. Clean up any water that  spills on the floor as soon as it happens.  Remove soap buildup in the tub or shower regularly.  Attach bath mats securely with double-sided non-slip rug tape.  Do not have throw rugs and other things on the floor that can make you trip. What can I do in the bedroom?  Use night lights.  Make sure that you have a light by your bed that is easy to reach.  Do not use any sheets or blankets that are too big for your bed. They should not hang down onto the floor.  Have a firm chair that has side arms. You can use this for support while you get dressed.  Do not have throw rugs and other things on the floor that can make you trip. What can I do in the kitchen?  Clean up any spills right away.  Avoid walking on wet floors.  Keep items that you use a lot in easy-to-reach places.  If you need to reach something above you, use a strong step stool that has a grab bar.  Keep electrical cords out of the way.  Do not use floor polish or wax that makes floors slippery. If you must use  wax, use non-skid floor wax.  Do not have throw rugs and other things on the floor that can make you trip. What can I do with my stairs?  Do not leave any items on the stairs.  Make sure that there are handrails on both sides of the stairs and use them. Fix handrails that are broken or loose. Make sure that handrails are as long as the stairways.  Check any carpeting to make sure that it is firmly attached to the stairs. Fix any carpet that is loose or worn.  Avoid having throw rugs at the top or bottom of the stairs. If you do have throw rugs, attach them to the floor with carpet tape.  Make sure that you have a light switch at the top of the stairs and the bottom of the stairs. If you do not have them, ask someone to add them for you. What else can I do to help prevent falls?  Wear shoes that:  Do not have high heels.  Have rubber bottoms.  Are comfortable and fit you well.  Are closed at the  toe. Do not wear sandals.  If you use a stepladder:  Make sure that it is fully opened. Do not climb a closed stepladder.  Make sure that both sides of the stepladder are locked into place.  Ask someone to hold it for you, if possible.  Clearly mark and make sure that you can see:  Any grab bars or handrails.  First and last steps.  Where the edge of each step is.  Use tools that help you move around (mobility aids) if they are needed. These include:  Canes.  Walkers.  Scooters.  Crutches.  Turn on the lights when you go into a dark area. Replace any light bulbs as soon as they burn out.  Set up your furniture so you have a clear path. Avoid moving your furniture around.  If any of your floors are uneven, fix them.  If there are any pets around you, be aware of where they are.  Review your medicines with your doctor. Some medicines can make you feel dizzy. This can increase your chance of falling. Ask your doctor what other things that you can do to help prevent falls. This information is not intended to replace advice given to you by your health care provider. Make sure you discuss any questions you have with your health care provider. Document Released: 02/24/2009 Document Revised: 10/06/2015 Document Reviewed: 06/04/2014 Elsevier Interactive Patient Education  2017 [employer].

## 2018-03-14 LAB — CMP14 + ANION GAP
ALT: 17 IU/L (ref 0–32)
AST: 21 IU/L (ref 0–40)
Albumin/Globulin Ratio: 1.6 (ref 1.2–2.2)
Albumin: 4.6 g/dL (ref 3.5–4.8)
Alkaline Phosphatase: 55 IU/L (ref 39–117)
Anion Gap: 16 mmol/L (ref 10.0–18.0)
BUN/Creatinine Ratio: 17 (ref 12–28)
BUN: 13 mg/dL (ref 8–27)
Bilirubin Total: 0.4 mg/dL (ref 0.0–1.2)
CO2: 23 mmol/L (ref 20–29)
Calcium: 9.7 mg/dL (ref 8.7–10.3)
Chloride: 102 mmol/L (ref 96–106)
Creatinine, Ser: 0.77 mg/dL (ref 0.57–1.00)
GFR calc Af Amer: 87 mL/min/{1.73_m2} (ref >59)
GFR calc non Af Amer: 76 mL/min/{1.73_m2} (ref >59)
Globulin, Total: 2.9 g/dL (ref 1.5–4.5)
Glucose: 79 mg/dL (ref 65–99)
Potassium: 4.3 mmol/L (ref 3.5–5.2)
Sodium: 141 mmol/L (ref 134–144)
Total Protein: 7.5 g/dL (ref 6.0–8.5)

## 2018-03-14 LAB — HEMOGLOBIN A1C
Est. average glucose Bld gHb Est-mCnc: 126 mg/dL
Hgb A1c MFr Bld: 6 % — ABNORMAL HIGH (ref 4.8–5.6)

## 2018-03-14 LAB — CBC
Hematocrit: 39.3 % (ref 34.0–46.6)
Hemoglobin: 13.2 g/dL (ref 11.1–15.9)
MCH: 31.6 pg (ref 26.6–33.0)
MCHC: 33.6 g/dL (ref 31.5–35.7)
MCV: 94 fL (ref 79–97)
Platelets: 222 10*3/uL (ref 150–450)
RBC: 4.18 x10E6/uL (ref 3.77–5.28)
RDW: 14 % (ref 12.3–15.4)
WBC: 5.1 10*3/uL (ref 3.4–10.8)

## 2018-03-18 ENCOUNTER — Encounter: Payer: Self-pay | Admitting: Internal Medicine

## 2018-04-22 DIAGNOSIS — E119 Type 2 diabetes mellitus without complications: Secondary | ICD-10-CM | POA: Diagnosis not present

## 2018-05-13 DIAGNOSIS — E119 Type 2 diabetes mellitus without complications: Secondary | ICD-10-CM | POA: Diagnosis not present

## 2018-06-13 ENCOUNTER — Other Ambulatory Visit: Payer: Self-pay | Admitting: Internal Medicine

## 2018-06-17 ENCOUNTER — Ambulatory Visit: Payer: Medicare HMO | Admitting: Internal Medicine

## 2018-06-17 ENCOUNTER — Ambulatory Visit (INDEPENDENT_AMBULATORY_CARE_PROVIDER_SITE_OTHER): Payer: Medicare HMO | Admitting: Internal Medicine

## 2018-06-17 ENCOUNTER — Encounter: Payer: Self-pay | Admitting: Internal Medicine

## 2018-06-17 VITALS — BP 130/74 | HR 80 | Temp 97.9°F | Ht 62.0 in | Wt 180.8 lb

## 2018-06-17 DIAGNOSIS — N182 Chronic kidney disease, stage 2 (mild): Secondary | ICD-10-CM | POA: Diagnosis not present

## 2018-06-17 DIAGNOSIS — E1165 Type 2 diabetes mellitus with hyperglycemia: Secondary | ICD-10-CM | POA: Diagnosis not present

## 2018-06-17 DIAGNOSIS — E1122 Type 2 diabetes mellitus with diabetic chronic kidney disease: Secondary | ICD-10-CM | POA: Diagnosis not present

## 2018-06-17 DIAGNOSIS — E7800 Pure hypercholesterolemia, unspecified: Secondary | ICD-10-CM

## 2018-06-17 DIAGNOSIS — IMO0002 Reserved for concepts with insufficient information to code with codable children: Secondary | ICD-10-CM

## 2018-06-17 DIAGNOSIS — Z6833 Body mass index (BMI) 33.0-33.9, adult: Secondary | ICD-10-CM | POA: Diagnosis not present

## 2018-06-17 DIAGNOSIS — I129 Hypertensive chronic kidney disease with stage 1 through stage 4 chronic kidney disease, or unspecified chronic kidney disease: Secondary | ICD-10-CM | POA: Diagnosis not present

## 2018-06-17 DIAGNOSIS — M1A4790 Other secondary chronic gout, unspecified ankle and foot, without tophus (tophi): Secondary | ICD-10-CM | POA: Diagnosis not present

## 2018-06-17 DIAGNOSIS — E6609 Other obesity due to excess calories: Secondary | ICD-10-CM | POA: Diagnosis not present

## 2018-06-17 HISTORY — DX: Morbid (severe) obesity due to excess calories: E66.01

## 2018-06-17 HISTORY — DX: Pure hypercholesterolemia, unspecified: E78.00

## 2018-06-17 NOTE — Progress Notes (Signed)
Subjective:     Patient ID: Pamela Key , female    DOB: 1940-10-20 , 78 y.o.   MRN: [id number]   Chief Complaint  Patient presents with  . Diabetes  . Hypertension    HPI  Diabetes  She presents for her follow-up diabetic visit. She has type 2 diabetes mellitus. Her disease course has been stable. There are no hypoglycemic associated symptoms. Pertinent negatives for diabetes include no blurred vision and no chest pain. There are no hypoglycemic complications. Diabetic complications include nephropathy. Risk factors for coronary artery disease include diabetes mellitus, dyslipidemia, hypertension, obesity, post-menopausal and sedentary lifestyle. Her home blood glucose trend is fluctuating minimally. Her breakfast blood glucose is taken between 8-9 am. Her breakfast blood glucose range is generally 90-110 mg/dl.  Hypertension  This is a chronic problem. The current episode started more than 1 year ago. The problem has been gradually improving since onset. The problem is controlled. Pertinent negatives include no blurred vision or chest pain.   She reports compliance with meds.   Past Medical History:  Diagnosis Date  . Diabetes mellitus without complication (HCC)   . Hypertension      Family History  Problem Relation Age of Onset  . Diabetes Mother   . Hypertension Mother   . Cancer Father   . Hypertension Father      Current Outpatient Medications:  .  lisinopril (PRINIVIL,ZESTRIL) 20 MG tablet, Take 1 tablet by mouth daily., Disp: 90 tablet, Rfl: 1 .  metFORMIN (GLUCOPHAGE) 500 MG tablet, Take 1 tablet by mouth twice daily. (Patient taking differently: 500 mg. ), Disp: 180 tablet, Rfl: 1 .  Multiple Vitamin (MULTIVITAMIN WITH MINERALS) TABS tablet, Take 1 tablet by mouth daily with breakfast., Disp: , Rfl:  .  pravastatin (PRAVACHOL) 40 MG tablet, Take 1 tablet by mouth every evening., Disp: 90 tablet, Rfl: 0   No Known Allergies   Review of Systems   Constitutional: Negative.   Eyes: Negative for blurred vision.  Respiratory: Negative.   Cardiovascular: Negative.  Negative for chest pain.  Gastrointestinal: Negative.   Neurological: Negative.   Psychiatric/Behavioral: Negative.      Today's Vitals   06/17/18 1438  BP: 130/74  Pulse: 80  Temp: 97.9 F (36.6 C)  TempSrc: Oral  Weight: 180 lb 12.8 oz (82 kg)  Height: 5\' 2"  (1.575 m)  PainSc: 0-No pain   Body mass index is 33.07 kg/m.   Objective:  Physical Exam Vitals signs and nursing note reviewed.  Constitutional:      Appearance: Normal appearance. She is obese.  HENT:     Head: Normocephalic and atraumatic.  Cardiovascular:     Rate and Rhythm: Normal rate and regular rhythm.     Heart sounds: Normal heart sounds.  Pulmonary:     Effort: Pulmonary effort is normal.     Breath sounds: Normal breath sounds.  Skin:    General: Skin is warm.  Neurological:     Mental Status: She is alert.  Psychiatric:        Mood and Affect: Mood normal.        Behavior: Behavior normal.         Assessment And Plan:     1. 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 I will adjust meds as needed. She is encouraged to cut back on her juice intake.   2. Hypertensive nephropathy  Controlled. She will continue with  current meds. She is encouraged to avoid adding salt to her foods.   3. Pure hypercholesterolemia  I will check a non-fasting lipid panel. She is encouraged to avoid fried foods and to increase her daily activity.   4. 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   5. Class 1 obesity due to excess calories with serious comorbidity and body mass index (BMI) of 33.0 to 33.9 in adult  She is encouraged to initially strive for BMI less than 30 to decrease cardiac risk. She is advised to aim for 30 minutes five days weekly.    Gwynneth Aliment, MD

## 2018-06-17 NOTE — Patient Instructions (Signed)

## 2018-06-18 LAB — CMP14+EGFR
ALT: 20 IU/L (ref 0–32)
AST: 28 IU/L (ref 0–40)
Albumin/Globulin Ratio: 1.5 (ref 1.2–2.2)
Albumin: 4.2 g/dL (ref 3.7–4.7)
Alkaline Phosphatase: 53 IU/L (ref 39–117)
BUN/Creatinine Ratio: 11 — ABNORMAL LOW (ref 12–28)
BUN: 9 mg/dL (ref 8–27)
Bilirubin Total: 0.4 mg/dL (ref 0.0–1.2)
CO2: 23 mmol/L (ref 20–29)
Calcium: 9.7 mg/dL (ref 8.7–10.3)
Chloride: 104 mmol/L (ref 96–106)
Creatinine, Ser: 0.8 mg/dL (ref 0.57–1.00)
GFR calc Af Amer: 83 mL/min/{1.73_m2} (ref >59)
GFR calc non Af Amer: 72 mL/min/{1.73_m2} (ref >59)
Globulin, Total: 2.8 g/dL (ref 1.5–4.5)
Glucose: 118 mg/dL — ABNORMAL HIGH (ref 65–99)
Potassium: 3.8 mmol/L (ref 3.5–5.2)
Sodium: 146 mmol/L — ABNORMAL HIGH (ref 134–144)
Total Protein: 7 g/dL (ref 6.0–8.5)

## 2018-06-18 LAB — HEMOGLOBIN A1C
Est. average glucose Bld gHb Est-mCnc: 126 mg/dL
Hgb A1c MFr Bld: 6 % — ABNORMAL HIGH (ref 4.8–5.6)

## 2018-06-18 LAB — LIPID PANEL
Chol/HDL Ratio: 3 ratio (ref 0.0–4.4)
Cholesterol, Total: 165 mg/dL (ref 100–199)
HDL: 55 mg/dL (ref >39)
LDL Calculated: 85 mg/dL (ref 0–99)
Triglycerides: 124 mg/dL (ref 0–149)
VLDL Cholesterol Cal: 25 mg/dL (ref 5–40)

## 2018-06-18 LAB — TSH: TSH: 0.746 u[IU]/mL (ref 0.450–4.500)

## 2018-06-18 LAB — URIC ACID: Uric Acid: 7.8 mg/dL — ABNORMAL HIGH (ref 2.5–7.1)

## 2018-07-22 DIAGNOSIS — E119 Type 2 diabetes mellitus without complications: Secondary | ICD-10-CM | POA: Diagnosis not present

## 2018-10-20 ENCOUNTER — Ambulatory Visit: Payer: Medicare HMO | Admitting: Internal Medicine

## 2018-10-20 ENCOUNTER — Telehealth: Payer: Self-pay

## 2018-10-20 NOTE — Telephone Encounter (Signed)
I left the pt a message that I was calling the pt to check on her because she no showed an appt that she confirmed.

## 2018-12-19 ENCOUNTER — Telehealth: Payer: Self-pay

## 2018-12-19 NOTE — Telephone Encounter (Signed)
I left the patient a message that I was returning her call to schedule her an appt.

## 2018-12-29 ENCOUNTER — Telehealth: Payer: Self-pay

## 2018-12-29 NOTE — Telephone Encounter (Signed)
I left the pt a message that I was returning her call to schedule her an appt for  A f/u and to also see what meds she needed a refill on.

## 2018-12-30 ENCOUNTER — Telehealth: Payer: Self-pay

## 2018-12-30 NOTE — Telephone Encounter (Signed)
Scheduled pt for missed appt 12/30/2018

## 2018-12-31 ENCOUNTER — Encounter: Payer: Self-pay | Admitting: Internal Medicine

## 2018-12-31 ENCOUNTER — Other Ambulatory Visit: Payer: Self-pay

## 2018-12-31 ENCOUNTER — Ambulatory Visit (INDEPENDENT_AMBULATORY_CARE_PROVIDER_SITE_OTHER): Payer: Medicare PPO | Admitting: Internal Medicine

## 2018-12-31 VITALS — BP 150/88 | HR 62 | Temp 97.4°F | Wt 182.0 lb

## 2018-12-31 DIAGNOSIS — M79672 Pain in left foot: Secondary | ICD-10-CM

## 2018-12-31 DIAGNOSIS — M1A3720 Chronic gout due to renal impairment, left ankle and foot, without tophus (tophi): Secondary | ICD-10-CM

## 2018-12-31 DIAGNOSIS — M21611 Bunion of right foot: Secondary | ICD-10-CM

## 2018-12-31 DIAGNOSIS — M21612 Bunion of left foot: Secondary | ICD-10-CM

## 2018-12-31 DIAGNOSIS — E1122 Type 2 diabetes mellitus with diabetic chronic kidney disease: Secondary | ICD-10-CM | POA: Diagnosis not present

## 2018-12-31 DIAGNOSIS — E1165 Type 2 diabetes mellitus with hyperglycemia: Secondary | ICD-10-CM

## 2018-12-31 DIAGNOSIS — Z23 Encounter for immunization: Secondary | ICD-10-CM | POA: Diagnosis not present

## 2018-12-31 DIAGNOSIS — I129 Hypertensive chronic kidney disease with stage 1 through stage 4 chronic kidney disease, or unspecified chronic kidney disease: Secondary | ICD-10-CM | POA: Diagnosis not present

## 2018-12-31 DIAGNOSIS — M21619 Bunion of unspecified foot: Secondary | ICD-10-CM

## 2018-12-31 DIAGNOSIS — N182 Chronic kidney disease, stage 2 (mild): Secondary | ICD-10-CM

## 2018-12-31 DIAGNOSIS — Z6833 Body mass index (BMI) 33.0-33.9, adult: Secondary | ICD-10-CM

## 2018-12-31 DIAGNOSIS — E6609 Other obesity due to excess calories: Secondary | ICD-10-CM

## 2018-12-31 DIAGNOSIS — IMO0002 Reserved for concepts with insufficient information to code with codable children: Secondary | ICD-10-CM

## 2018-12-31 MED ORDER — LISINOPRIL 20 MG PO TABS: 20.0000 mg | ORAL_TABLET | Freq: Every day | ORAL | 1 refills | Status: DC

## 2018-12-31 MED ORDER — KETOROLAC TROMETHAMINE 30 MG/ML IJ SOLN
30.0000 mg | Freq: Once | INTRAMUSCULAR | Status: AC
  Administered 2018-12-31: 30 mg via INTRAMUSCULAR

## 2018-12-31 MED ORDER — COLCHICINE 0.6 MG PO TABS: 0.6000 mg | ORAL_TABLET | Freq: Every day | ORAL | 2 refills | Status: DC

## 2018-12-31 MED ORDER — METFORMIN HCL 500 MG PO TABS: 500.0000 mg | ORAL_TABLET | Freq: Two times a day (BID) | ORAL | 1 refills | Status: DC

## 2018-12-31 NOTE — Progress Notes (Signed)
Subjective:     Patient ID: Pamela Key , female    DOB: 1940/12/15 , 78 y.o.   MRN: [id number]   Chief Complaint  Patient presents with  . Diabetes  . Hypertension    HPI  Diabetes She presents for her follow-up diabetic visit. She has type 2 diabetes mellitus. Her disease course has been stable. There are no hypoglycemic associated symptoms. Pertinent negatives for diabetes include no blurred vision and no chest pain. There are no hypoglycemic complications. Diabetic complications include nephropathy. Risk factors for coronary artery disease include diabetes mellitus, dyslipidemia, hypertension, obesity, post-menopausal and sedentary lifestyle. She is following a generally healthy diet. She never participates in exercise. Her home blood glucose trend is fluctuating minimally. Her breakfast blood glucose is taken between 8-9 am. Her breakfast blood glucose range is generally 90-110 mg/dl. An ACE inhibitor/angiotensin II receptor blocker is being taken. She does not see a podiatrist. Hypertension This is a chronic problem. The current episode started more than 1 year ago. The problem has been gradually improving since onset. The problem is controlled. Pertinent negatives include no blurred vision or chest pain. Risk factors for coronary artery disease include diabetes mellitus, dyslipidemia, post-menopausal state, obesity and sedentary lifestyle. Past treatments include ACE inhibitors. The current treatment provides moderate improvement. Compliance problems include exercise.  Hypertensive end-organ damage includes kidney disease.     Past Medical History:  Diagnosis Date  . Diabetes mellitus without complication ([place])   . Gout   . High cholesterol   . Hypertension      Family History  Problem Relation Age of Onset  . Diabetes Mother   . Hypertension Mother   . Cancer Father   . Hypertension Father      Current Outpatient Medications:  .  lisinopril (ZESTRIL) 20 MG tablet,  Take 1 tablet (20 mg total) by mouth daily., Disp: 90 tablet, Rfl: 1 .  metFORMIN (GLUCOPHAGE) 500 MG tablet, Take 1 tablet (500 mg total) by mouth 2 (two) times daily., Disp: 180 tablet, Rfl: 1 .  Multiple Vitamin (MULTIVITAMIN WITH MINERALS) TABS tablet, Take 1 tablet by mouth daily with breakfast., Disp: , Rfl:  .  pravastatin (PRAVACHOL) 40 MG tablet, Take 1 tablet by mouth every evening. (Patient taking differently: 1 tab weekly), Disp: 90 tablet, Rfl: 0 .  colchicine 0.6 MG tablet, Take 1 tablet (0.6 mg total) by mouth daily., Disp: 30 tablet, Rfl: 2   No Known Allergies   Review of Systems  Constitutional: Negative.   Eyes: Negative for blurred vision.  Respiratory: Negative.   Cardiovascular: Negative.  Negative for chest pain.  Gastrointestinal: Negative.   Musculoskeletal: Positive for arthralgias.       C/o left foot pain, started a week ago  Neurological: Negative.   Psychiatric/Behavioral: Negative.      Today's Vitals   12/31/18 1108  BP: (!) 150/88  Pulse: 62  Temp: (!) 97.4 F (36.3 C)  TempSrc: Oral  Weight: 182 lb (82.6 kg)  PainSc: 8   PainLoc: Foot   Body mass index is 33.29 kg/m.   Objective:  Physical Exam Vitals signs and nursing note reviewed.  Constitutional:      Appearance: Normal appearance.  HENT:     Head: Normocephalic and atraumatic.  Cardiovascular:     Rate and Rhythm: Normal rate and regular rhythm.     Pulses:          Dorsalis pedis pulses are 2+ on the right side and 2+ on the  left side.     Heart sounds: Normal heart sounds.  Pulmonary:     Effort: Pulmonary effort is normal.     Breath sounds: Normal breath sounds.  Musculoskeletal:     Right foot: Bunion present.     Left foot: Bunion present.  Feet:     Right foot:     Protective Sensation: 5 sites tested. 5 sites sensed.     Skin integrity: Skin integrity normal.     Toenail Condition: Right toenails are long.     Left foot:     Protective Sensation: 5 sites tested.  5 sites sensed.     Skin integrity: Skin integrity normal.     Toenail Condition: Left toenails are long.     Comments: Left bunion tender to touch. Slightly warm to touch Skin:    General: Skin is warm.  Neurological:     General: No focal deficit present.     Mental Status: She is alert.  Psychiatric:        Mood and Affect: Mood normal.        Behavior: Behavior normal.         Assessment And Plan:      1. 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 Importance of dietary AND exercise AND office visit compliance was stressed to the patient. She is reminded that she should be seen at least every four months for revaluation. I will check labs as listed below.   - Hemoglobin A1c - CMP14+EGFR  2. Hypertensive nephropathy  Chronic, uncontrolled. Her sx are likely exacerbated by her foot pain. She will continue with current meds for now.   3. 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 I will check uric acid level today. She is encouraged to increase her water intake. She was given rx colchicine 0.43m to take once daily. I hesitate to prescribe oral prednisone taper due to elevated bp. She is encouraged to call me tomorrow to let me know how she is doing.   - Uric acid  4. Left foot pain  She was given toradol 377mIM x1. Pt advised that she should notice improvement in her symptoms within the hour.  - ketorolac (TORADOL) 30 MG/ML injection 30 mg  5. Bunion of great toe  Chronic. She does not wish to undergo surgical repair.   6. Class 1 obesity due to excess calories with serious comorbidity and body mass index (BMI) of 33.0 to 33.9 in adult  Importance of achieving optimal weight to decrease risk of cardiovascular disease and cancers was discussed with the patient in full detail.  Importance of regular exercise was discussed with  the patient.   She is encouraged to start slowly - start with 10 minutes twice daily at least three to four days per week and to gradually build to 30 minutes five days weekly. She was given tips to incorporate more activity into her daily routine - take stairs when possible, park farther away from her job, grocery stores, etc.   7. Encounter for immunization  - Flu vaccine HIGH DOSE PF (Fluzone High dose)    RoMaximino GreenlandMD    THE PATIENT IS ENCOURAGED TO PRACTICE SOCIAL DISTANCING DUE TO THE COVID-19 PANDEMIC.

## 2018-12-31 NOTE — Patient Instructions (Signed)
Gout  Gout is painful swelling of your joints. Gout is a type of arthritis. It is caused by having too much uric acid in your body. Uric acid is a chemical that is made when your body breaks down substances called purines. If your body has too much uric acid, sharp crystals can form and build up in your joints. This causes pain and swelling. Gout attacks can happen quickly and be very painful (acute gout). Over time, the attacks can affect more joints and happen more often (chronic gout). What are the causes?  Too much uric acid in your blood. This can happen because: ? Your kidneys do not remove enough uric acid from your blood. ? Your body makes too much uric acid. ? You eat too many foods that are high in purines. These foods include organ meats, some seafood, and beer.  Trauma or stress. What increases the risk?  Having a family history of gout.  Being female and middle-aged.  Being female and having gone through menopause.  Being very overweight (obese).  Drinking alcohol, especially beer.  Not having enough water in the body (being dehydrated).  Losing weight too quickly.  Having an organ transplant.  Having lead poisoning.  Taking certain medicines.  Having kidney disease.  Having a skin condition called psoriasis. What are the signs or symptoms? An attack of acute gout usually happens in just one joint. The most common place is the big toe. Attacks often start at night. Other joints that may be affected include joints of the feet, ankle, knee, fingers, wrist, or elbow. Symptoms of an attack may include:  Very bad pain.  Warmth.  Swelling.  Stiffness.  Shiny, red, or purple skin.  Tenderness. The affected joint may be very painful to touch.  Chills and fever. Chronic gout may cause symptoms more often. More joints may be involved. You may also have white or yellow lumps (tophi) on your hands or feet or in other areas near your joints. How is this  treated?  Treatment for this condition has two phases: treating an acute attack and preventing future attacks.  Acute gout treatment may include: ? NSAIDs. ? Steroids. These are taken by mouth or injected into a joint. ? Colchicine. This medicine relieves pain and swelling. It can be given by mouth or through an IV tube.  Preventive treatment may include: ? Taking small doses of NSAIDs or colchicine daily. ? Using a medicine that reduces uric acid levels in your blood. ? Making changes to your diet. You may need to see a food expert (dietitian) about what to eat and drink to prevent gout. Follow these instructions at home: During a gout attack   If told, put ice on the painful area: ? Put ice in a plastic bag. ? Place a towel between your skin and the bag. ? Leave the ice on for 20 minutes, 2-3 times a day.  Raise (elevate) the painful joint above the level of your heart as often as you can.  Rest the joint as much as possible. If the joint is in your leg, you may be given crutches.  Follow instructions from your doctor about what you cannot eat or drink. Avoiding future gout attacks  Eat a low-purine diet. Avoid foods and drinks such as: ? Liver. ? Kidney. ? Anchovies. ? Asparagus. ? Herring. ? Mushrooms. ? Mussels. ? Beer.  Stay at a healthy weight. If you want to lose weight, talk with your doctor. Do not lose weight   too fast.  Start or continue an exercise plan as told by your doctor. Eating and drinking  Drink enough fluids to keep your pee (urine) pale yellow.  If you drink alcohol: ? Limit how much you use to:  0-1 drink a day for women.  0-2 drinks a day for men. ? Be aware of how much alcohol is in your drink. In the [place] one drink equals one 12 oz bottle of beer (355 mL), one 5 oz glass of wine (148 mL), or one 1 oz glass of hard liquor (44 mL). General instructions  Take over-the-counter and prescription medicines only as told by your doctor.  Do  not drive or use heavy machinery while taking prescription pain medicine.  Return to your normal activities as told by your doctor. Ask your doctor what activities are safe for you.  Keep all follow-up visits as told by your doctor. This is important. Contact a doctor if:  You have another gout attack.  You still have symptoms of a gout attack after 10 days of treatment.  You have problems (side effects) because of your medicines.  You have chills or a fever.  You have burning pain when you pee (urinate).  You have pain in your lower back or belly. Get help right away if:  You have very bad pain.  Your pain cannot be controlled.  You cannot pee. Summary  Gout is painful swelling of the joints.  The most common site of pain is the big toe, but it can affect other joints.  Medicines and avoiding some foods can help to prevent and treat gout attacks. This information is not intended to replace advice given to you by your health care provider. Make sure you discuss any questions you have with your health care provider. Document Released: 02/07/2008 Document Revised: 11/20/2017 Document Reviewed: 11/20/2017 Elsevier Patient Education  2020 [employer] Inc[employer]  

## 2019-01-01 LAB — CMP14+EGFR
ALT: 16 IU/L (ref 0–32)
AST: 24 IU/L (ref 0–40)
Albumin/Globulin Ratio: 1.6 (ref 1.2–2.2)
Albumin: 4.4 g/dL (ref 3.7–4.7)
Alkaline Phosphatase: 64 IU/L (ref 39–117)
BUN/Creatinine Ratio: 11 — ABNORMAL LOW (ref 12–28)
BUN: 9 mg/dL (ref 8–27)
Bilirubin Total: 0.4 mg/dL (ref 0.0–1.2)
CO2: 25 mmol/L (ref 20–29)
Calcium: 9.5 mg/dL (ref 8.7–10.3)
Chloride: 103 mmol/L (ref 96–106)
Creatinine, Ser: 0.79 mg/dL (ref 0.57–1.00)
GFR calc Af Amer: 84 mL/min/{1.73_m2} (ref >59)
GFR calc non Af Amer: 73 mL/min/{1.73_m2} (ref >59)
Globulin, Total: 2.7 g/dL (ref 1.5–4.5)
Glucose: 109 mg/dL — ABNORMAL HIGH (ref 65–99)
Potassium: 3.3 mmol/L — ABNORMAL LOW (ref 3.5–5.2)
Sodium: 146 mmol/L — ABNORMAL HIGH (ref 134–144)
Total Protein: 7.1 g/dL (ref 6.0–8.5)

## 2019-01-01 LAB — URIC ACID: Uric Acid: 7 mg/dL (ref 2.5–7.1)

## 2019-01-01 LAB — HEMOGLOBIN A1C
Est. average glucose Bld gHb Est-mCnc: 128 mg/dL
Hgb A1c MFr Bld: 6.1 % — ABNORMAL HIGH (ref 4.8–5.6)

## 2019-01-06 ENCOUNTER — Other Ambulatory Visit: Payer: Self-pay | Admitting: Internal Medicine

## 2019-01-06 ENCOUNTER — Other Ambulatory Visit: Payer: Self-pay

## 2019-01-06 MED ORDER — PRAVASTATIN SODIUM 40 MG PO TABS: ORAL_TABLET | ORAL | 1 refills | Status: DC

## 2019-02-11 ENCOUNTER — Other Ambulatory Visit: Payer: Self-pay

## 2019-02-11 ENCOUNTER — Encounter: Payer: Self-pay | Admitting: Internal Medicine

## 2019-02-11 ENCOUNTER — Ambulatory Visit (INDEPENDENT_AMBULATORY_CARE_PROVIDER_SITE_OTHER): Payer: Medicare Other | Admitting: Internal Medicine

## 2019-02-11 VITALS — BP 140/72 | HR 63 | Temp 98.3°F | Ht 62.4 in | Wt 180.2 lb

## 2019-02-11 DIAGNOSIS — I129 Hypertensive chronic kidney disease with stage 1 through stage 4 chronic kidney disease, or unspecified chronic kidney disease: Secondary | ICD-10-CM

## 2019-02-11 DIAGNOSIS — Z79899 Other long term (current) drug therapy: Secondary | ICD-10-CM

## 2019-02-11 DIAGNOSIS — M1A3720 Chronic gout due to renal impairment, left ankle and foot, without tophus (tophi): Secondary | ICD-10-CM | POA: Diagnosis not present

## 2019-02-11 DIAGNOSIS — E6609 Other obesity due to excess calories: Secondary | ICD-10-CM | POA: Diagnosis not present

## 2019-02-11 DIAGNOSIS — Z6832 Body mass index (BMI) 32.0-32.9, adult: Secondary | ICD-10-CM

## 2019-02-11 NOTE — Patient Instructions (Signed)
 Gout  Gout is painful swelling of your joints. Gout is a type of arthritis. It is caused by having too much uric acid in your body. Uric acid is a chemical that is made when your body breaks down substances called purines. If your body has too much uric acid, sharp crystals can form and build up in your joints. This causes pain and swelling. Gout attacks can happen quickly and be very painful (acute gout). Over time, the attacks can affect more joints and happen more often (chronic gout). What are the causes?  Too much uric acid in your blood. This can happen because: ? Your kidneys do not remove enough uric acid from your blood. ? Your body makes too much uric acid. ? You eat too many foods that are high in purines. These foods include organ meats, some seafood, and beer.  Trauma or stress. What increases the risk?  Having a family history of gout.  Being female and middle-aged.  Being female and having gone through menopause.  Being very overweight (obese).  Drinking alcohol, especially beer.  Not having enough water in the body (being dehydrated).  Losing weight too quickly.  Having an organ transplant.  Having lead poisoning.  Taking certain medicines.  Having kidney disease.  Having a skin condition called psoriasis. What are the signs or symptoms? An attack of acute gout usually happens in just one joint. The most common place is the big toe. Attacks often start at night. Other joints that may be affected include joints of the feet, ankle, knee, fingers, wrist, or elbow. Symptoms of an attack may include:  Very bad pain.  Warmth.  Swelling.  Stiffness.  Shiny, red, or purple skin.  Tenderness. The affected joint may be very painful to touch.  Chills and fever. Chronic gout may cause symptoms more often. More joints may be involved. You may also have white or yellow lumps (tophi) on your hands or feet or in other areas near your joints. How is this  treated?  Treatment for this condition has two phases: treating an acute attack and preventing future attacks.  Acute gout treatment may include: ? NSAIDs. ? Steroids. These are taken by mouth or injected into a joint. ? Colchicine. This medicine relieves pain and swelling. It can be given by mouth or through an IV tube.  Preventive treatment may include: ? Taking small doses of NSAIDs or colchicine daily. ? Using a medicine that reduces uric acid levels in your blood. ? Making changes to your diet. You may need to see a food expert (dietitian) about what to eat and drink to prevent gout. Follow these instructions at home: During a gout attack   If told, put ice on the painful area: ? Put ice in a plastic bag. ? Place a towel between your skin and the bag. ? Leave the ice on for 20 minutes, 2-3 times a day.  Raise (elevate) the painful joint above the level of your heart as often as you can.  Rest the joint as much as possible. If the joint is in your leg, you may be given crutches.  Follow instructions from your doctor about what you cannot eat or drink. Avoiding future gout attacks  Eat a low-purine diet. Avoid foods and drinks such as: ? Liver. ? Kidney. ? Anchovies. ? Asparagus. ? Herring. ? Mushrooms. ? Mussels. ? Beer.  Stay at a healthy weight. If you want to lose weight, talk with your doctor. Do not lose   weight too fast.  Start or continue an exercise plan as told by your doctor. Eating and drinking  Drink enough fluids to keep your pee (urine) pale yellow.  If you drink alcohol: ? Limit how much you use to:  0-1 drink a day for women.  0-2 drinks a day for men. ? Be aware of how much alcohol is in your drink. In the [place] one drink equals one 12 oz bottle of beer (355 mL), one 5 oz glass of wine (148 mL), or one 1 oz glass of hard liquor (44 mL). General instructions  Take over-the-counter and prescription medicines only as told by your doctor.  Do  not drive or use heavy machinery while taking prescription pain medicine.  Return to your normal activities as told by your doctor. Ask your doctor what activities are safe for you.  Keep all follow-up visits as told by your doctor. This is important. Contact a doctor if:  You have another gout attack.  You still have symptoms of a gout attack after 10 days of treatment.  You have problems (side effects) because of your medicines.  You have chills or a fever.  You have burning pain when you pee (urinate).  You have pain in your lower back or belly. Get help right away if:  You have very bad pain.  Your pain cannot be controlled.  You cannot pee. Summary  Gout is painful swelling of the joints.  The most common site of pain is the big toe, but it can affect other joints.  Medicines and avoiding some foods can help to prevent and treat gout attacks. This information is not intended to replace advice given to you by your health care provider. Make sure you discuss any questions you have with your health care provider. Document Released: 02/07/2008 Document Revised: 11/20/2017 Document Reviewed: 11/20/2017 Elsevier Patient Education  2020 [employer] Inc[employer]   Low-Purine Eating Plan A low-purine eating plan involves making food choices to limit your intake of purine. Purine is a kind of uric acid. Too much uric acid in your blood can cause certain conditions, such as gout and kidney stones. Eating a low-purine diet can help control these conditions. What are tips for following this plan? Reading food labels   Avoid foods with saturated or Trans fat.  Check the ingredient list of grains-based foods, such as bread and cereal, to make sure that they contain whole grains.  Check the ingredient list of sauces or soups to make sure they do not contain meat or fish.  When choosing soft drinks, check the ingredient list to make sure they do not contain high-fructose corn syrup.  Shopping  Buy plenty of fresh fruits and vegetables.  Avoid buying canned or fresh fish.  Buy dairy products labeled as low-fat or nonfat.  Avoid buying premade or processed foods. These foods are often high in fat, salt (sodium), and added sugar. Cooking  Use olive oil instead of butter when cooking. Oils like olive oil, canola oil, and sunflower oil contain healthy fats. Meal planning  Learn which foods do or do not affect you. If you find out that a food tends to cause your gout symptoms to flare up, avoid eating that food. You can enjoy foods that do not cause problems. If you have any questions about a food item, talk with your dietitian or health care provider.  Limit foods high in fat, especially saturated fat. Fat makes it harder for your body to get rid of   uric acid.  Choose foods that are lower in fat and are lean sources of protein. General guidelines  Limit alcohol intake to no more than 1 drink a day for nonpregnant women and 2 drinks a day for men. One drink equals 12 oz of beer, 5 oz of wine, or 1 oz of hard liquor. Alcohol can affect the way your body gets rid of uric acid.  Drink plenty of water to keep your urine clear or pale yellow. Fluids can help remove uric acid from your body.  If directed by your health care provider, take a vitamin C supplement.  Work with your health care provider and dietitian to develop a plan to achieve or maintain a healthy weight. Losing weight can help reduce uric acid in your blood. What foods are recommended? The items listed may not be a complete list. Talk with your dietitian about what dietary choices are best for you. Foods low in purines Foods low in purines do not need to be limited. These include:  All fruits.  All low-purine vegetables, pickles, and olives.  Breads, pasta, rice, cornbread, and popcorn. Cake and other baked goods.  All dairy foods.  Eggs, nuts, and nut butters.  Spices and condiments, such as  salt, herbs, and vinegar.  Plant oils, butter, and margarine.  Water, sugar-free soft drinks, tea, coffee, and cocoa.  Vegetable-based soups, broths, sauces, and gravies. Foods moderate in purines Foods moderate in purines should be limited to the amounts listed.   cup of asparagus, cauliflower, spinach, mushrooms, or green peas, each day.  2/3 cup uncooked oatmeal, each day.   cup dry wheat bran or wheat germ, each day.  2-3 ounces of meat or poultry, each day.  4-6 ounces of shellfish, such as crab, lobster, oysters, or shrimp, each day.  1 cup cooked beans, peas, or lentils, each day.  Soup, broths, or bouillon made from meat or fish. Limit these foods as much as possible. What foods are not recommended? The items listed may not be a complete list. Talk with your dietitian about what dietary choices are best for you. Limit your intake of foods high in purines, including:  Beer and other alcohol.  Meat-based gravy or sauce.  Canned or fresh fish, such as: ? Anchovies, sardines, herring, and tuna. ? Mussels and scallops. ? Codfish, trout, and haddock.  Bacon.  Organ meats, such as: ? Liver or kidney. ? Tripe. ? Sweetbreads (thymus gland or pancreas).  Wild game or goose.  Yeast or yeast extract supplements.  Drinks sweetened with high-fructose corn syrup. Summary  Eating a low-purine diet can help control conditions caused by too much uric acid in the body, such as gout or kidney stones.  Choose low-purine foods, limit alcohol, and limit foods high in fat.  You will learn over time which foods do or do not affect you. If you find out that a food tends to cause your gout symptoms to flare up, avoid eating that food. This information is not intended to replace advice given to you by your health care provider. Make sure you discuss any questions you have with your health care provider. Document Released: 08/25/2010 Document Revised: 04/12/2017 Document  Reviewed: 06/13/2016 Elsevier Patient Education  2020 Elsevier Inc.   

## 2019-02-11 NOTE — Progress Notes (Signed)
Subjective:     Patient ID: Pamela Key , female    DOB: 1940/06/01 , 78 y.o.   MRN: [id number]   Chief Complaint  Patient presents with  . Gout    HPI  She is here today for f/u gout. She was started on daily colchicine at her last visit. She has not had any issues with this regimen. She denies having any gout attacks since her last visit. She reports her feet feel well today.     Past Medical History:  Diagnosis Date  . Diabetes mellitus without complication ([place])   . Gout   . High cholesterol   . Hypertension      Family History  Problem Relation Age of Onset  . Diabetes Mother   . Hypertension Mother   . Cancer Father   . Hypertension Father      Current Outpatient Medications:  .  colchicine 0.6 MG tablet, Take 1 tablet (0.6 mg total) by mouth daily., Disp: 30 tablet, Rfl: 2 .  lisinopril (ZESTRIL) 20 MG tablet, Take 1 tablet (20 mg total) by mouth daily., Disp: 90 tablet, Rfl: 1 .  metFORMIN (GLUCOPHAGE) 500 MG tablet, Take 1 tablet (500 mg total) by mouth 2 (two) times daily., Disp: 180 tablet, Rfl: 1 .  Multiple Vitamin (MULTIVITAMIN WITH MINERALS) TABS tablet, Take 1 tablet by mouth daily with breakfast., Disp: , Rfl:  .  pravastatin (PRAVACHOL) 40 MG tablet, Take 1 tablet by mouth Monday - Friday, Disp: 75 tablet, Rfl: 1   No Known Allergies   Review of Systems  Constitutional: Negative.   Respiratory: Negative.   Cardiovascular: Negative.   Gastrointestinal: Negative.   Neurological: Negative.   Psychiatric/Behavioral: Negative.      Today's Vitals   02/11/19 1443  BP: 140/72  Pulse: 63  Temp: 98.3 F (36.8 C)  TempSrc: Oral  SpO2: 97%  Weight: 180 lb 3.2 oz (81.7 kg)  Height: 5' 2.4" (1.585 m)   Body mass index is 32.54 kg/m.   Objective:  Physical Exam Vitals signs and nursing note reviewed.  Constitutional:      Appearance: Normal appearance.  HENT:     Head: Normocephalic and atraumatic.  Cardiovascular:     Rate and  Rhythm: Normal rate and regular rhythm.     Heart sounds: Normal heart sounds.  Pulmonary:     Effort: Pulmonary effort is normal.     Breath sounds: Normal breath sounds.  Skin:    General: Skin is warm.  Neurological:     General: No focal deficit present.     Mental Status: She is alert.  Psychiatric:        Mood and Affect: Mood normal.        Behavior: Behavior normal.         Assessment And Plan:     1. 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 She will continue with colchicine daily for now. I plan to add allopurinol at her next visit. I will also check a uric acid level at her next visit. She is encouraged to stay well hydrated.   - Referral to Chronic Care Management Services  2. Hypertensive nephropathy  Chronic, fair control. Improved from last visit. She will continue with current meds. She is agreeablet to CCM referral - she would benefit from more education regarding her disease states. Additionally, SW evaluation is needed b/c she is in need of transportation to her office visits.  Pt advised Tillie Rung and/or Glenard Haring will contact her to introduce her to the program. She is thankful for the referral.   - Referral to Chronic Care Management Services  3. Class 1 obesity due to excess calories with serious comorbidity and body mass index (BMI) of 32.0 to 32.9 in adult  She is encouraged to strive for BMI less than 28 to decrease cardiac risk. She is encouraged to incorporate more activity into her daily routine and to avoid processed foods.   4. Drug therapy  - BMP8+EGFR        [name], MD    THE PATIENT IS ENCOURAGED TO PRACTICE SOCIAL DISTANCING DUE TO THE COVID-19 PANDEMIC.

## 2019-02-12 LAB — BMP8+EGFR
BUN/Creatinine Ratio: 16 (ref 12–28)
BUN: 12 mg/dL (ref 8–27)
CO2: 24 mmol/L (ref 20–29)
Calcium: 9.3 mg/dL (ref 8.7–10.3)
Chloride: 107 mmol/L — ABNORMAL HIGH (ref 96–106)
Creatinine, Ser: 0.73 mg/dL (ref 0.57–1.00)
GFR calc Af Amer: 93 mL/min/{1.73_m2} (ref >59)
GFR calc non Af Amer: 80 mL/min/{1.73_m2} (ref >59)
Glucose: 78 mg/dL (ref 65–99)
Potassium: 3.7 mmol/L (ref 3.5–5.2)
Sodium: 145 mmol/L — ABNORMAL HIGH (ref 134–144)

## 2019-02-16 ENCOUNTER — Ambulatory Visit: Payer: Self-pay

## 2019-02-16 ENCOUNTER — Ambulatory Visit (INDEPENDENT_AMBULATORY_CARE_PROVIDER_SITE_OTHER): Payer: Medicare Other

## 2019-02-16 DIAGNOSIS — IMO0002 Reserved for concepts with insufficient information to code with codable children: Secondary | ICD-10-CM

## 2019-02-16 DIAGNOSIS — I10 Essential (primary) hypertension: Secondary | ICD-10-CM

## 2019-02-16 DIAGNOSIS — N182 Chronic kidney disease, stage 2 (mild): Secondary | ICD-10-CM

## 2019-02-16 DIAGNOSIS — E1122 Type 2 diabetes mellitus with diabetic chronic kidney disease: Secondary | ICD-10-CM | POA: Diagnosis not present

## 2019-02-16 DIAGNOSIS — E1165 Type 2 diabetes mellitus with hyperglycemia: Secondary | ICD-10-CM | POA: Diagnosis not present

## 2019-02-16 NOTE — Chronic Care Management (AMB) (Signed)
  Chronic Care Management   Initial Visit Note  02/16/2019 Name: Pamela Key MRN: [id number] DOB: [birth year]  Referred by: Glendale Chard, MD Reason for referral : Chronic Care Management (CCM RNCM Case Collaboration )   Pamela Key is a 78 y.o. year old female who is a primary care patient of Glendale Chard, MD. The care management team was consulted for assistance with chronic disease management and care coordination needs.   Review of patient status, including review of consultants reports, relevant laboratory and other test results, and collaboration with appropriate care team members and the patient's provider was performed as part of comprehensive patient evaluation and provision of chronic care management services.    I initiated and established the plan of care for Pamela Key during one on one collaboration with my clinical care management colleague Pamela Key BSW who is also engaged with this patient to address social work needs.   Outpatient Encounter Medications as of 02/16/2019  Medication Sig  . colchicine 0.6 MG tablet Take 1 tablet (0.6 mg total) by mouth daily.  [name] lisinopril (ZESTRIL) 20 MG tablet Take 1 tablet (20 mg total) by mouth daily.  . metFORMIN (GLUCOPHAGE) 500 MG tablet Take 1 tablet (500 mg total) by mouth 2 (two) times daily.  . Multiple Vitamin (MULTIVITAMIN WITH MINERALS) TABS tablet Take 1 tablet by mouth daily with breakfast.  . pravastatin (PRAVACHOL) 40 MG tablet Take 1 tablet by mouth Monday - Friday   No facility-administered encounter medications on file as of 02/16/2019.      Goals Addressed    . Assist with Chronic Care Management and Care Coordination needs       Current Barriers:  [name] Knowledge Barriers related to resources and support available to address needs related to Chronic disease management and Care Coordination needs  Case Manager Clinical Goal(s):  [name] Over the next 30 days, patient will work with the CCM team to  address needs related to Chronic Care Management and Care Coordination needs  Interventions:  . Collaborated with BSW and initiated plan of care to address needs related to Chronic Care Management  Patient Self Care Activities:  . Self administers medications as prescribed . Calls pharmacy for medication refills . Calls provider office for new concerns or questions  Initial goal documentation         Telephone follow up appointment with care management team member scheduled for: 02/24/19  Pamela Merino, RN, BSN, CCM Care Management Coordinator [facility]  Direct Phone: [phone]

## 2019-02-16 NOTE — Patient Instructions (Signed)
Social Worker Visit Information  Goals we discussed today:  Goals Addressed            This Visit's Progress     Patient Stated   . "I need help with transportation" (pt-stated)       Current Barriers:  . Financial constraints related to ability to afford a vehicle . Limited education about health plan benefit  Clinical Social Work Clinical Goal(s):  [name] Over the next 30 days the patient will follow up with her health plan as directed by SW to gain a better understanding of transportation benefits  CCM SW Interventions: Completed 02/16/2019 . Patient interviewed and appropriate assessments performed . Determined the patient has difficulty attending provider appointments due to lack of transportation. The patient is unable to afford her own vehicle at this time . Provided patient with information about transportation services offerred under her health plan . Informed by the patient she is in need of glasses but has delayed care from her ophthalmologist due to transportation concerns . Encouraged the patient to arrange and appointment allowing time to schedule transportation within the 3 business days prior to appointment . Advised patient to contact her health plan via the contact number on the back of her card to obtain specific transportation benefits . Educated patient on the [place] transportation program . Determined the patient is still able to walk to and from a local bus stop and is not interested in applying for SCAT at this time . Scheduled follow up call to the patient over the next month to assess progression of patient goal  Patient Self Care Activities:  . Self administers medications as prescribed . Calls pharmacy for medication refills . Performs ADL's independently . Does not attend all scheduled provider appointments  Initial goal documentation     . "I would like to learn more about advance directives" (pt-stated)       Current Barriers:  . Limited education  about the importance of naming a healthcare power of attorney  Clinical Social Work Clinical Goal(s):  [name] Over the next 30 days, patient will verbalize basic understanding of Advanced Directives and importance of completion  CCM SW Interventions: Completed 02/16/2019 . Interviewed patient about Financial controller and provided education about the importance of completing advanced directives . Mailed the patient an Emergency planning/management officer . Advised patient to review information mailed by this SW  Patient Self Care Activities:  . Can read and write at 6th grading reading level . Can identify next of kin, power or attorney, guardian, or primary caregiver . Performs ADLs independently  Initial goal documentation      . "my bathroom has mold in it and the floor is weak" (pt-stated)       Current Barriers:  . Financial constraints related to ability to perform home repair . Lacks knowledge of community resource: [employer]   Clinical Social Work Clinical Goal(s):  [name] Over the next 60 days the patient will work with SW to explore community resources to assist with home repair needs.  CCM SW Interventions: Completed 02/16/2019 . Patient interviewed and appropriate assessments performed . Determined the patient has concerns with mold in her bathroom as well as a weakened floor surrounding the tub . Provided patient with information about community resources to assist with home repair needs . Advised the patient SW would place referral to [facility] program to request emergency assistance for repair needs . Outbound call placed to the program, voice message left requesting  an application packet be mailed to the patients home address  Patient Self Care Activities:  . Performs ADL's independently . Performs IADL's independently . Unable to independently perform home modification needs  Initial goal documentation       Other   .  Collaborate with RN CAse Manager to perform appropriate assessments to determine care management and care coordination needs       Current Barriers:  [name] Knowledge barriers related to the independent self-health management of chronic conditions including DM and HTN  Social Work Clinical Goal(s):  [name] Over the next 30 days the patients will work with RN Case Manager to establish an individualized plan of care related to the management of patients identified chronic conditions  CCM SW Interventions: Completed 02/16/2019 . Patient interviewed and appropriate assessments performed . Assessed patients understanding of current chronic medical conditions. The patient identifies current conditions as high blood pressure and diabetes . Determined patient has difficulty managing chronic conditions due to inconsistent home monitoring. The patient reports daily CBG monitoring and record keeping. Unfortunately, the patient does not have a consistent time she is checking CBG and identifies some days she checks prior to a meal while others she checks after a meal . Informed by the patient she just recently obtained an in home blood pressure cuff and has yet to initiate daily BP readings . Assessed for patient ability to afford prescribed medications. The patient identifies no difficulty at this time . Scheduled initial assessment with RN Case Manager for disease education and management on 10/13 . Advised the patient to gather medications for medication review during scheduled appointment time . Collaboration with RN Case Manager regarding patient enrollment and [place] appointment  Patient Self Care Activities:  . Does not adhere to provider recommendations re: home monitoring of fasting CBG readings and/or daily BP checks  Initial goal documentation         Materials provided: Yes: mailed information on advance directives  Ms. Cinelli was given information about Chronic Care Management services today  including:  1. CCM service includes personalized support from designated clinical staff supervised by her physician, including individualized plan of care and coordination with other care providers 2. 24/7 contact phone numbers for assistance for urgent and routine care needs. 3. Service will only be billed when office clinical staff spend 20 minutes or more in a month to coordinate care. 4. Only one practitioner may furnish and bill the service in a calendar month. 5. The patient may stop CCM services at any time (effective at the end of the month) by phone call to the office staff. 6. The patient will be responsible for cost sharing (co-pay) of up to 20% of the service fee (after annual deductible is met).  Patient agreed to services and verbal consent obtained.   The patient verbalized understanding of instructions provided today and declined a print copy of patient instruction materials.   Follow up plan: SW will follow up with patient by phone over the next month   [name], BSW, CDP Social Worker, Certified Dementia Practitioner [place] [facility] [place] [facility] [phone]

## 2019-02-16 NOTE — Chronic Care Management (AMB) (Signed)
Chronic Care Management   Social Work General Note  02/16/2019 Name: Pamela Key MRN: [id number] DOB: 06/18/1940  Pamela Key is a 78 y.o. year old female who is a primary care patient of Glendale Chard, MD. The CCM was consulted to assist the patient with Transportation Needs .   Pamela Key was given information about Chronic Care Management services today including:  1. CCM service includes personalized support from designated clinical staff supervised by her physician, including individualized plan of care and coordination with other care providers 2. 24/7 contact phone numbers for assistance for urgent and routine care needs. 3. Service will only be billed when office clinical staff spend 20 minutes or more in a month to coordinate care. 4. Only one practitioner may furnish and bill the service in a calendar month. 5. The patient may stop CCM services at any time (effective at the end of the month) by phone call to the office staff. 6. The patient will be responsible for cost sharing (co-pay) of up to 20% of the service fee (after annual deductible is met).  Patient agreed to services and verbal consent obtained.   Review of patient status, including review of consultants reports, relevant laboratory and other test results, and collaboration with appropriate care team members and the patient's provider was performed as part of comprehensive patient evaluation and provision of chronic care management services.    SDOH (Social Determinants of Health) screening performed today. See Care Plan Entry related to challenges with: Transportation Financial Strain   Advanced Directives Status: The patient denies current directive but does express interest in completion.  See Care Plan for related entries.   Outpatient Encounter Medications as of 02/16/2019  Medication Sig  . colchicine 0.6 MG tablet Take 1 tablet (0.6 mg total) by mouth daily.  [name] lisinopril (ZESTRIL) 20 MG tablet  Take 1 tablet (20 mg total) by mouth daily.  . metFORMIN (GLUCOPHAGE) 500 MG tablet Take 1 tablet (500 mg total) by mouth 2 (two) times daily.  . Multiple Vitamin (MULTIVITAMIN WITH MINERALS) TABS tablet Take 1 tablet by mouth daily with breakfast.  . pravastatin (PRAVACHOL) 40 MG tablet Take 1 tablet by mouth Monday - Friday   No facility-administered encounter medications on file as of 02/16/2019.     Goals Addressed            This Visit's Progress     Patient Stated   . "I need help with transportation" (pt-stated)       Current Barriers:  . Financial constraints related to ability to afford a vehicle . Limited education about health plan benefit  Clinical Social Work Clinical Goal(s):  [name] Over the next 30 days the patient will follow up with her health plan as directed by SW to gain a better understanding of transportation benefits  CCM SW Interventions: Completed 02/16/2019 . Patient interviewed and appropriate assessments performed . Determined the patient has difficulty attending provider appointments due to lack of transportation. The patient is unable to afford her own vehicle at this time . Provided patient with information about transportation services offerred under her health plan . Informed by the patient she is in need of glasses but has delayed care from her ophthalmologist due to transportation concerns . Encouraged the patient to arrange and appointment allowing time to schedule transportation within the 3 business days prior to appointment . Advised patient to contact her health plan via the contact number on the back of her card to obtain specific  transportation benefits . Educated patient on the [place] transportation program . Determined the patient is still able to walk to and from a local bus stop and is not interested in applying for SCAT at this time . Scheduled follow up call to the patient over the next month to assess progression of patient goal  Patient  Self Care Activities:  . Self administers medications as prescribed . Calls pharmacy for medication refills . Performs ADL's independently . Does not attend all scheduled provider appointments  Initial goal documentation     . "I would like to learn more about advance directives" (pt-stated)       Current Barriers:  . Limited education about the importance of naming a healthcare power of attorney  Clinical Social Work Clinical Goal(s):  [name] Over the next 30 days, patient will verbalize basic understanding of Advanced Directives and importance of completion  CCM SW Interventions: Completed 02/16/2019 . Interviewed patient about Financial controller and provided education about the importance of completing advanced directives . Mailed the patient an Emergency planning/management officer . Advised patient to review information mailed by this SW  Patient Self Care Activities:  . Can read and write at 6th grading reading level . Can identify next of kin, power or attorney, guardian, or primary caregiver . Performs ADLs independently  Initial goal documentation    . "my bathroom has mold in it and the floor is weak" (pt-stated)       Current Barriers:  . Financial constraints related to ability to perform home repair . Lacks knowledge of community resource: [employer]   Clinical Social Work Clinical Goal(s):  [name] Over the next 60 days the patient will work with SW to explore community resources to assist with home repair needs.  CCM SW Interventions: Completed 02/16/2019 . Patient interviewed and appropriate assessments performed . Determined the patient has concerns with mold in her bathroom as well as a weakened floor surrounding the tub . Provided patient with information about community resources to assist with home repair needs . Advised the patient SW would place referral to [facility] program to request emergency assistance for repair needs .  Outbound call placed to the program, voice message left requesting an application packet be mailed to the patients home address  Patient Self Care Activities:  . Performs ADL's independently . Performs IADL's independently . Unable to independently perform home modification needs  Initial goal documentation      Other   . Collaborate with RN Case Manager to perform appropriate assessments to determine care management and care coordination needs       Current Barriers:  [name] Knowledge barriers related to the independent self-health management of chronic conditions including DM and HTN  Social Work Clinical Goal(s):  [name] Over the next 30 days the patients will work with RN Case Manager to establish an individualized plan of care related to the management of patients identified chronic conditions  CCM SW Interventions: Completed 02/16/2019 . Patient interviewed and appropriate assessments performed . Assessed patients understanding of current chronic medical conditions. The patient identifies current conditions as high blood pressure and diabetes . Determined patient has difficulty managing chronic conditions due to inconsistent home monitoring. The patient reports daily CBG monitoring and record keeping. Unfortunately, the patient does not have a consistent time she is checking CBG and identifies some days she checks prior to a meal while others she checks after a meal . Informed by the patient she just recently obtained an  in home blood pressure cuff and has yet to initiate daily BP readings . Assessed for patient ability to afford prescribed medications. The patient identifies no difficulty at this time . Scheduled initial assessment with RN Case Manager for disease education and management on 10/13 . Advised the patient to gather medications for medication review during scheduled appointment time . Collaboration with RN Case Manager regarding patient enrollment and [place] appointment   Patient Self Care Activities:  . Does not adhere to provider recommendations re: home monitoring of fasting CBG readings and/or daily BP checks  Initial goal documentation         Follow Up Plan: SW will follow up with patient by phone over the next 4 weeks.       [name], BSW, CDP Social Worker, Certified Dementia Practitioner [place] [facility] [place] [facility] [phone]  Total time spent performing care coordination and/or care management activities with the patient by phone or face to face = 40 minutes.

## 2019-02-24 ENCOUNTER — Telehealth: Payer: Self-pay

## 2019-03-10 ENCOUNTER — Telehealth: Payer: Self-pay

## 2019-03-10 ENCOUNTER — Ambulatory Visit: Payer: Self-pay

## 2019-03-10 DIAGNOSIS — I10 Essential (primary) hypertension: Secondary | ICD-10-CM

## 2019-03-10 DIAGNOSIS — IMO0002 Reserved for concepts with insufficient information to code with codable children: Secondary | ICD-10-CM

## 2019-03-10 DIAGNOSIS — E1165 Type 2 diabetes mellitus with hyperglycemia: Secondary | ICD-10-CM

## 2019-03-10 DIAGNOSIS — E1122 Type 2 diabetes mellitus with diabetic chronic kidney disease: Secondary | ICD-10-CM

## 2019-03-10 NOTE — Chronic Care Management (AMB) (Signed)
  Chronic Care Management   Social Work Note  03/10/2019 Name: Pamela Key MRN: [id number] DOB: 13-Sep-1940  Unsuccessful outbound call placed to assess progression of patient stated goals. HIPAA compliant voice message left requesting a return call.   Follow Up Plan: SW will follow up with patient by phone over the next two weeks.  [name], BSW, CDP Social Worker, Certified Dementia Practitioner [place] [facility] [place] [facility] [phone]

## 2019-03-18 ENCOUNTER — Ambulatory Visit: Payer: Medicare HMO

## 2019-03-19 ENCOUNTER — Ambulatory Visit: Payer: Medicare HMO | Admitting: Internal Medicine

## 2019-03-19 ENCOUNTER — Ambulatory Visit (INDEPENDENT_AMBULATORY_CARE_PROVIDER_SITE_OTHER): Payer: Medicare Other | Admitting: Internal Medicine

## 2019-03-19 ENCOUNTER — Other Ambulatory Visit: Payer: Self-pay

## 2019-03-19 ENCOUNTER — Encounter: Payer: Self-pay | Admitting: Internal Medicine

## 2019-03-19 ENCOUNTER — Ambulatory Visit (INDEPENDENT_AMBULATORY_CARE_PROVIDER_SITE_OTHER): Payer: Medicare Other

## 2019-03-19 VITALS — BP 130/78 | HR 66 | Temp 98.7°F | Ht 62.4 in | Wt 178.2 lb

## 2019-03-19 VITALS — BP 130/78 | HR 66 | Temp 98.0°F | Ht 62.4 in | Wt 178.2 lb

## 2019-03-19 DIAGNOSIS — Z23 Encounter for immunization: Secondary | ICD-10-CM | POA: Diagnosis not present

## 2019-03-19 DIAGNOSIS — E1122 Type 2 diabetes mellitus with diabetic chronic kidney disease: Secondary | ICD-10-CM

## 2019-03-19 DIAGNOSIS — E1165 Type 2 diabetes mellitus with hyperglycemia: Secondary | ICD-10-CM

## 2019-03-19 DIAGNOSIS — Z0000 Encounter for general adult medical examination without abnormal findings: Secondary | ICD-10-CM

## 2019-03-19 DIAGNOSIS — Z6832 Body mass index (BMI) 32.0-32.9, adult: Secondary | ICD-10-CM

## 2019-03-19 DIAGNOSIS — N182 Chronic kidney disease, stage 2 (mild): Secondary | ICD-10-CM

## 2019-03-19 DIAGNOSIS — IMO0002 Reserved for concepts with insufficient information to code with codable children: Secondary | ICD-10-CM

## 2019-03-19 DIAGNOSIS — I10 Essential (primary) hypertension: Secondary | ICD-10-CM

## 2019-03-19 DIAGNOSIS — M1A3720 Chronic gout due to renal impairment, left ankle and foot, without tophus (tophi): Secondary | ICD-10-CM | POA: Diagnosis not present

## 2019-03-19 DIAGNOSIS — I129 Hypertensive chronic kidney disease with stage 1 through stage 4 chronic kidney disease, or unspecified chronic kidney disease: Secondary | ICD-10-CM | POA: Diagnosis not present

## 2019-03-19 DIAGNOSIS — E6609 Other obesity due to excess calories: Secondary | ICD-10-CM

## 2019-03-19 LAB — POCT UA - MICROALBUMIN
Creatinine, POC: 300 mg/dL
Microalbumin Ur, POC: 80 mg/L

## 2019-03-19 LAB — POCT URINALYSIS DIPSTICK
Bilirubin, UA: NEGATIVE
Glucose, UA: NEGATIVE
Nitrite, UA: NEGATIVE
Protein, UA: POSITIVE — AB
Spec Grav, UA: >=1.03 — AB (ref 1.010–1.025)
Urobilinogen, UA: 0.2 E.U./dL
pH, UA: 5.5 (ref 5.0–8.0)

## 2019-03-19 MED ORDER — PREVNAR 13 IM SUSP: 0.5000 mL | INTRAMUSCULAR | 0 refills | Status: AC

## 2019-03-19 NOTE — Progress Notes (Signed)
Subjective:   Pamela Key is a 78 y.o. female who presents for Medicare Annual (Subsequent) preventive examination.  Review of Systems:  n/a Cardiac Risk Factors include: advanced age (>49men, >50 women);hypertension;diabetes mellitus;obesity (BMI >30kg/m2)     Objective:     Vitals: BP 130/78 (BP Location: Left Arm, Patient Position: Sitting, Cuff Size: Normal)   Pulse 66   Temp 98 F (36.7 C) (Oral)   Ht 5' 2.4" (1.585 m)   Wt 178 lb 3.2 oz (80.8 kg)   SpO2 98%   BMI 32.18 kg/m   Body mass index is 32.18 kg/m.  Advanced Directives 03/19/2019 02/16/2019 03/13/2018 10/10/2016 11/28/2015 10/28/2014  Does Patient Have a Medical Advance Directive? No No No No No No  Would patient like information on creating a medical advance directive? - Yes (MAU/Ambulatory/Procedural Areas - Information given) No - Patient declined - No - patient declined information -    Tobacco Social History   Tobacco Use  Smoking Status Former Smoker  . Packs/day: 0.25  . Types: Cigarettes  . Quit date: 11  . Years since quitting: 44.8  Smokeless Tobacco Never Used  Tobacco Comment   smoked at least 10 years, can't remember exact years     Counseling given: Not Answered Comment: smoked at least 10 years, can't remember exact years   Clinical Intake:  Pre-visit preparation completed: Yes  Pain : No/denies pain     Nutritional Status: BMI > 30  Obese Nutritional Risks: None Diabetes: Yes CBG done?: No Did pt. bring in CBG monitor from home?: No  How often do you need to have someone help you when you read instructions, pamphlets, or other written materials from your doctor or pharmacy?: 1 - Never What is the last grade level you completed in school?: master's degree  Interpreter Needed?: No  Information entered by :: NAllen LPN  Past Medical History:  Diagnosis Date  . Diabetes mellitus without complication ([place])   . Gout   . High cholesterol   . Hypertension    Past  Surgical History:  Procedure Laterality Date  . TUBAL LIGATION  1976   Family History  Problem Relation Age of Onset  . Diabetes Mother   . Hypertension Mother   . Cancer Father   . Hypertension Father    Social History   Socioeconomic History  . Marital status: Divorced    Spouse name: Not on file  . Number of children: Not on file  . Years of education: Not on file  . Highest education level: Not on file  Occupational History  . Occupation: retired  Scientific laboratory technician  . Financial resource strain: Not hard at all  . Food insecurity    Worry: Never true    Inability: Never true  . Transportation needs    Medical: No    Non-medical: No  Tobacco Use  . Smoking status: Former Smoker    Packs/day: 0.25    Types: Cigarettes    Quit date: 1976    Years since quitting: 44.8  . Smokeless tobacco: Never Used  . Tobacco comment: smoked at least 10 years, can't remember exact years  Substance and Sexual Activity  . Alcohol use: Yes    Comment: on occassion  . Drug use: No  . Sexual activity: Not Currently  Lifestyle  . Physical activity    Days per week: 4 days    Minutes per session: 30 min  . Stress: Not at all  Relationships  . Social  connections    Talks on phone: Not on file    Gets together: Not on file    Attends religious service: Not on file    Active member of club or organization: Not on file    Attends meetings of clubs or organizations: Not on file    Relationship status: Not on file  Other Topics Concern  . Not on file  Social History Narrative  . Not on file    Outpatient Encounter Medications as of 03/19/2019  Medication Sig  . colchicine 0.6 MG tablet Take 1 tablet (0.6 mg total) by mouth daily.  [name] lisinopril (ZESTRIL) 20 MG tablet Take 1 tablet (20 mg total) by mouth daily.  . metFORMIN (GLUCOPHAGE) 500 MG tablet Take 1 tablet (500 mg total) by mouth 2 (two) times daily.  . Multiple Vitamin (MULTIVITAMIN WITH MINERALS) TABS tablet Take 1 tablet by  mouth daily with breakfast.  . pravastatin (PRAVACHOL) 40 MG tablet Take 1 tablet by mouth Monday - Friday  . pneumococcal 13-valent conjugate vaccine (PREVNAR 13) SUSP injection Inject 0.5 mLs into the muscle tomorrow at 10 am for 1 dose.  . [DISCONTINUED] lisinopril (PRINIVIL,ZESTRIL) 20 MG tablet Take 1 tablet by mouth daily.  . [DISCONTINUED] metFORMIN (GLUCOPHAGE) 500 MG tablet Take 1 tablet by mouth twice daily. (Patient taking differently: 500 mg. )  . [DISCONTINUED] pravastatin (PRAVACHOL) 40 MG tablet Take 1 tablet by mouth every evening. (Patient taking differently: 1 tab weekly)   No facility-administered encounter medications on file as of 03/19/2019.     Activities of Daily Living In your present state of health, do you have any difficulty performing the following activities: 03/19/2019  Hearing? N  Vision? N  Difficulty concentrating or making decisions? N  Walking or climbing stairs? N  Dressing or bathing? N  Doing errands, shopping? N  Preparing Food and eating ? N  Using the Toilet? N  In the past six months, have you accidently leaked urine? N  Do you have problems with loss of bowel control? N  Managing your Medications? N  Managing your Finances? N  Housekeeping or managing your Housekeeping? N  Some recent data might be hidden    Patient Care Team: [name], MD as PCP - General (Internal Medicine) [name] as Social Worker [place], Karma Lew, RN as Case Manager    Assessment:   This is a routine wellness examination for Pamela Key.  Exercise Activities and Dietary recommendations Current Exercise Habits: Home exercise routine, Type of exercise: walking, Time (Minutes): 30, Frequency (Times/Week): 4, Weekly Exercise (Minutes/Week): 120  Goals    . "I need help with transportation" (pt-stated)     Current Barriers:  . Financial constraints related to ability to afford a vehicle . Limited education about health plan benefit  Clinical Social Work  Clinical Goal(s):  [name] Over the next 30 days the patient will follow up with her health plan as directed by SW to gain a better understanding of transportation benefits  CCM SW Interventions: Completed 02/16/2019 . Patient interviewed and appropriate assessments performed . Determined the patient has difficulty attending provider appointments due to lack of transportation. The patient is unable to afford her own vehicle at this time . Provided patient with information about transportation services offerred under her health plan . Informed by the patient she is in need of glasses but has delayed care from her ophthalmologist due to transportation concerns . Encouraged the patient to arrange and appointment allowing time to schedule transportation within  the 3 business days prior to appointment . Advised patient to contact her health plan via the contact number on the back of her card to obtain specific transportation benefits . Educated patient on the SCAT transportation program . Determined the patient is still able to walk to and from a local bus stop and is not interested in applying for SCAT at this time . Scheduled follow up call to the patient over the next month to assess progression of patient goal  Patient Self Care Activities:  . Self administers medications as prescribed . Calls pharmacy for medication refills . Performs ADL's independently . Does not attend all scheduled provider appointments  Initial goal documentation     . "I would like to learn more about advance directives" (pt-stated)     Current Barriers:  . Limited education about the importance of naming a healthcare power of attorney  Clinical Social Work Clinical Goal(s):  [name] Over the next 30 days, patient will verbalize basic understanding of Advanced Directives and importance of completion  CCM SW Interventions: Completed 02/16/2019 . Interviewed patient about Designer, industrial/product and provided education about the  importance of completing advanced directives . Mailed the patient an Engineer, production . Advised patient to review information mailed by this SW  Patient Self Care Activities:  . Can read and write at 6th grading reading level . Can identify next of kin, power or attorney, guardian, or primary caregiver . Performs ADLs independently  Initial goal documentation      . "my bathroom has mold in it and the floor is weak" (pt-stated)     Current Barriers:  . Financial constraints related to ability to perform home repair . Lacks knowledge of community resource: [employer]   Clinical Social Work Clinical Goal(s):  [name] Over the next 60 days the patient will work with SW to explore community resources to assist with home repair needs.  CCM SW Interventions: Completed 02/16/2019 . Patient interviewed and appropriate assessments performed . Determined the patient has concerns with mold in her bathroom as well as a weakened floor surrounding the tub . Provided patient with information about community resources to assist with home repair needs . Advised the patient SW would place referral to [facility] program to request emergency assistance for repair needs . Outbound call placed to the program, voice message left requesting an application packet be mailed to the patients home address  Patient Self Care Activities:  . Performs ADL's independently . Performs IADL's independently . Unable to independently perform home modification needs  Initial goal documentation     . Assist with Chronic Care Management and Care Coordination needs     Current Barriers:  [name] Knowledge Barriers related to resources and support available to address needs related to Chronic disease management and Care Coordination needs  Case Manager Clinical Goal(s):  [name] Over the next 30 days, patient will work with the CCM team to address needs related to Chronic Care  Management and Care Coordination needs  Interventions:  . Collaborated with BSW and initiated plan of care to address needs related to Chronic Care Management  Patient Self Care Activities:  . Self administers medications as prescribed . Calls pharmacy for medication refills . Calls provider office for new concerns or questions  Initial goal documentation     . Collaborate with RN CAse Manager to perform appropriate assessments to determine care management and care coordination needs     Current Barriers:  [name] Knowledge barriers  related to the independent self-health management of chronic conditions including DM and HTN  Social Work Clinical Goal(s):  [name] Over the next 30 days the patients will work with RN Case Manager to establish an individualized plan of care related to the management of patients identified chronic conditions  CCM SW Interventions: Completed 02/16/2019 . Patient interviewed and appropriate assessments performed . Assessed patients understanding of current chronic medical conditions. The patient identifies current conditions as high blood pressure and diabetes . Determined patient has difficulty managing chronic conditions due to inconsistent home monitoring. The patient reports daily CBG monitoring and record keeping. Unfortunately, the patient does not have a consistent time she is checking CBG and identifies some days she checks prior to a meal while others she checks after a meal . Informed by the patient she just recently obtained an in home blood pressure cuff and has yet to initiate daily BP readings . Assessed for patient ability to afford prescribed medications. The patient identifies no difficulty at this time . Scheduled initial assessment with RN Case Manager for disease education and management on 10/13 . Advised the patient to gather medications for medication review during scheduled appointment time . Collaboration with RN Case Manager regarding patient  enrollment and upcomming appointment  Patient Self Care Activities:  . Does not adhere to provider recommendations re: home monitoring of fasting CBG readings and/or daily BP checks  Initial goal documentation     . Patient Stated     03/19/2019, want to stay medically stable       Fall Risk Fall Risk  03/19/2019 12/31/2018 03/13/2018  Falls in the past year? 0 0 -  Risk for fall due to : Medication side effect - Medication side effect  Follow up Falls evaluation completed;Education provided;Falls prevention discussed - -   Is the patient's home free of loose throw rugs in walkways, pet beds, electrical cords, etc?   yes      Grab bars in the bathroom? yes      Handrails on the stairs?   yes      Adequate lighting?   yes  Timed Get Up and Go performed: n/a  Depression Screen PHQ 2/9 Scores 03/19/2019 02/11/2019 12/31/2018 03/13/2018  PHQ - 2 Score 0 0 0 0  PHQ- 9 Score 0 - - -     Cognitive Function     6CIT Screen 03/19/2019 03/13/2018  What Year? 0 points 0 points  What month? 0 points 0 points  What time? 0 points 0 points  Count back from 20 0 points 0 points  Months in reverse 0 points 0 points  Repeat phrase 6 points 0 points  Total Score 6 0    Immunization History  Administered Date(s) Administered  . Influenza, High Dose Seasonal PF 12/31/2018  . Pneumococcal Polysaccharide-23 05/29/2010    Qualifies for Shingles Vaccine? yes  Screening Tests Health Maintenance  Topic Date Due  . PNA vac Low Risk Adult (2 of 2 - PCV13) 05/30/2011  . OPHTHALMOLOGY EXAM  12/18/2018  . HEMOGLOBIN A1C  07/03/2019  . FOOT EXAM  12/31/2019  . TETANUS/TDAP  02/10/2023  . INFLUENZA VACCINE  Completed  . DEXA SCAN  Completed    Cancer Screenings: Lung: Low Dose CT Chest recommended if Age 71-80 years, 30 pack-year currently smoking OR have quit w/in 15years. Patient does not qualify. Breast:  Up to date on Mammogram? No   Up to date of Bone Density/Dexa? Yes Colorectal:  up to date  Additional Screenings: : Hepatitis C Screening: n/a     Plan:    Patient wants to be medically stable.   I have personally reviewed and noted the following in the patient's chart:   . Medical and social history . Use of alcohol, tobacco or illicit drugs  . Current medications and supplements . Functional ability and status . Nutritional status . Physical activity . Advanced directives . List of other physicians . Hospitalizations, surgeries, and ER visits in previous 12 months . Vitals . Screenings to include cognitive, depression, and falls . Referrals and appointments  In addition, I have reviewed and discussed with patient certain preventive protocols, quality metrics, and best practice recommendations. A written personalized care plan for preventive services as well as general preventive health recommendations were provided to patient.     Pamela Merinoickeah E Cannen Dupras, LPN  16/1/096011/09/2018

## 2019-03-19 NOTE — Patient Instructions (Signed)
Pamela Key , Thank you for taking time to come for your Medicare Wellness Visit. I appreciate your ongoing commitment to your health goals. Please review the following plan we discussed and let me know if I can assist you in the future.   Screening recommendations/referrals: Colonoscopy: 09/2010 Mammogram: 2019 per patient Bone Density: 02/2017 Recommended yearly ophthalmology/optometry visit for glaucoma screening and checkup Recommended yearly dental visit for hygiene and checkup  Vaccinations: Influenza vaccine: 12/2018 Pneumococcal vaccine: sent to pharmacy Tdap vaccine: 01/2013 Shingles vaccine: discussed    Advanced directives: Advance directive discussed with you today. Even though you declined this today please call our office should you change your mind and we can give you the proper paperwork for you to fill out.   Conditions/risks identified: obesity  Next appointment: 07/23/2019 at 2:15   Preventive Care 78 Years and Older, Female Preventive care refers to lifestyle choices and visits with your health care provider that can promote health and wellness. What does preventive care include?  A yearly physical exam. This is also called an annual well check.  Dental exams once or twice a year.  Routine eye exams. Ask your health care provider how often you should have your eyes checked.  Personal lifestyle choices, including:  Daily care of your teeth and gums.  Regular physical activity.  Eating a healthy diet.  Avoiding tobacco and drug use.  Limiting alcohol use.  Practicing safe sex.  Taking low-dose aspirin every day.  Taking vitamin and mineral supplements as recommended by your health care provider. What happens during an annual well check? The services and screenings done by your health care provider during your annual well check will depend on your age, overall health, lifestyle risk factors, and family history of disease. Counseling  Your health  care provider may ask you questions about your:  Alcohol use.  Tobacco use.  Drug use.  Emotional well-being.  Home and relationship well-being.  Sexual activity.  Eating habits.  History of falls.  Memory and ability to understand (cognition).  Work and work Statistician.  Reproductive health. Screening  You may have the following tests or measurements:  Height, weight, and BMI.  Blood pressure.  Lipid and cholesterol levels. These may be checked every 5 years, or more frequently if you are over 57 years old.  Skin check.  Lung cancer screening. You may have this screening every year starting at age 17 if you have a 30-pack-year history of smoking and currently smoke or have quit within the past 15 years.  Fecal occult blood test (FOBT) of the stool. You may have this test every year starting at age 68.  Flexible sigmoidoscopy or colonoscopy. You may have a sigmoidoscopy every 5 years or a colonoscopy every 10 years starting at age 90.  Hepatitis C blood test.  Hepatitis B blood test.  Sexually transmitted disease (STD) testing.  Diabetes screening. This is done by checking your blood sugar (glucose) after you have not eaten for a while (fasting). You may have this done every 1-3 years.  Bone density scan. This is done to screen for osteoporosis. You may have this done starting at age 71.  Mammogram. This may be done every 1-2 years. Talk to your health care provider about how often you should have regular mammograms. Talk with your health care provider about your test results, treatment options, and if necessary, the need for more tests. Vaccines  Your health care provider may recommend certain vaccines, such as:  Influenza vaccine.  This is recommended every year.  Tetanus, diphtheria, and acellular pertussis (Tdap, Td) vaccine. You may need a Td booster every 10 years.  Zoster vaccine. You may need this after age 33.  Pneumococcal 13-valent conjugate  (PCV13) vaccine. One dose is recommended after age 35.  Pneumococcal polysaccharide (PPSV23) vaccine. One dose is recommended after age 33. Talk to your health care provider about which screenings and vaccines you need and how often you need them. This information is not intended to replace advice given to you by your health care provider. Make sure you discuss any questions you have with your health care provider. Document Released: 05/27/2015 Document Revised: 01/18/2016 Document Reviewed: 03/01/2015 Elsevier Interactive Patient Education  2017 [employer] Prevention in the Home Falls can cause injuries. They can happen to people of all ages. There are many things you can do to make your home safe and to help prevent falls. What can I do on the outside of my home?  Regularly fix the edges of walkways and driveways and fix any cracks.  Remove anything that might make you trip as you walk through a door, such as a raised step or threshold.  Trim any bushes or trees on the path to your home.  Use bright outdoor lighting.  Clear any walking paths of anything that might make someone trip, such as rocks or tools.  Regularly check to see if handrails are loose or broken. Make sure that both sides of any steps have handrails.  Any raised decks and porches should have guardrails on the edges.  Have any leaves, snow, or ice cleared regularly.  Use sand or salt on walking paths during winter.  Clean up any spills in your garage right away. This includes oil or grease spills. What can I do in the bathroom?  Use night lights.  Install grab bars by the toilet and in the tub and shower. Do not use towel bars as grab bars.  Use non-skid mats or decals in the tub or shower.  If you need to sit down in the shower, use a plastic, non-slip stool.  Keep the floor dry. Clean up any water that spills on the floor as soon as it happens.  Remove soap buildup in the tub or shower  regularly.  Attach bath mats securely with double-sided non-slip rug tape.  Do not have throw rugs and other things on the floor that can make you trip. What can I do in the bedroom?  Use night lights.  Make sure that you have a light by your bed that is easy to reach.  Do not use any sheets or blankets that are too big for your bed. They should not hang down onto the floor.  Have a firm chair that has side arms. You can use this for support while you get dressed.  Do not have throw rugs and other things on the floor that can make you trip. What can I do in the kitchen?  Clean up any spills right away.  Avoid walking on wet floors.  Keep items that you use a lot in easy-to-reach places.  If you need to reach something above you, use a strong step stool that has a grab bar.  Keep electrical cords out of the way.  Do not use floor polish or wax that makes floors slippery. If you must use wax, use non-skid floor wax.  Do not have throw rugs and other things on the floor that can make  you trip. What can I do with my stairs?  Do not leave any items on the stairs.  Make sure that there are handrails on both sides of the stairs and use them. Fix handrails that are broken or loose. Make sure that handrails are as long as the stairways.  Check any carpeting to make sure that it is firmly attached to the stairs. Fix any carpet that is loose or worn.  Avoid having throw rugs at the top or bottom of the stairs. If you do have throw rugs, attach them to the floor with carpet tape.  Make sure that you have a light switch at the top of the stairs and the bottom of the stairs. If you do not have them, ask someone to add them for you. What else can I do to help prevent falls?  Wear shoes that:  Do not have high heels.  Have rubber bottoms.  Are comfortable and fit you well.  Are closed at the toe. Do not wear sandals.  If you use a stepladder:  Make sure that it is fully  opened. Do not climb a closed stepladder.  Make sure that both sides of the stepladder are locked into place.  Ask someone to hold it for you, if possible.  Clearly mark and make sure that you can see:  Any grab bars or handrails.  First and last steps.  Where the edge of each step is.  Use tools that help you move around (mobility aids) if they are needed. These include:  Canes.  Walkers.  Scooters.  Crutches.  Turn on the lights when you go into a dark area. Replace any light bulbs as soon as they burn out.  Set up your furniture so you have a clear path. Avoid moving your furniture around.  If any of your floors are uneven, fix them.  If there are any pets around you, be aware of where they are.  Review your medicines with your doctor. Some medicines can make you feel dizzy. This can increase your chance of falling. Ask your doctor what other things that you can do to help prevent falls. This information is not intended to replace advice given to you by your health care provider. Make sure you discuss any questions you have with your health care provider. Document Released: 02/24/2009 Document Revised: 10/06/2015 Document Reviewed: 06/04/2014 Elsevier Interactive Patient Education  2017 [employer].

## 2019-03-19 NOTE — Patient Instructions (Signed)
Health Maintenance, Female Adopting a healthy lifestyle and getting preventive care are important in promoting health and wellness. Ask your health care provider about:  The right schedule for you to have regular tests and exams.  Things you can do on your own to prevent diseases and keep yourself healthy. What should I know about diet, weight, and exercise? Eat a healthy diet   Eat a diet that includes plenty of vegetables, fruits, low-fat dairy products, and lean protein.  Do not eat a lot of foods that are high in solid fats, added sugars, or sodium. Maintain a healthy weight Body mass index (BMI) is used to identify weight problems. It estimates body fat based on height and weight. Your health care provider can help determine your BMI and help you achieve or maintain a healthy weight. Get regular exercise Get regular exercise. This is one of the most important things you can do for your health. Most adults should:  Exercise for at least 150 minutes each week. The exercise should increase your heart rate and make you sweat (moderate-intensity exercise).  Do strengthening exercises at least twice a week. This is in addition to the moderate-intensity exercise.  Spend less time sitting. Even light physical activity can be beneficial. Watch cholesterol and blood lipids Have your blood tested for lipids and cholesterol at 76 years of age, then have this test every 5 years. Have your cholesterol levels checked more often if:  Your lipid or cholesterol levels are high.  You are older than 76 years of age.  You are at high risk for heart disease. What should I know about cancer screening? Depending on your health history and family history, you may need to have cancer screening at various ages. This may include screening for:  Breast cancer.  Cervical cancer.  Colorectal cancer.  Skin cancer.  Lung cancer. What should I know about heart disease, diabetes, and high blood  pressure? Blood pressure and heart disease  High blood pressure causes heart disease and increases the risk of stroke. This is more likely to develop in people who have high blood pressure readings, are of African descent, or are overweight.  Have your blood pressure checked: ? Every 3-5 years if you are 18-39 years of age. ? Every year if you are 40 years old or older. Diabetes Have regular diabetes screenings. This checks your fasting blood sugar level. Have the screening done:  Once every three years after age 40 if you are at a normal weight and have a low risk for diabetes.  More often and at a younger age if you are overweight or have a high risk for diabetes. What should I know about preventing infection? Hepatitis B If you have a higher risk for hepatitis B, you should be screened for this virus. Talk with your health care provider to find out if you are at risk for hepatitis B infection. Hepatitis C Testing is recommended for:  Everyone born from 1945 through 1965.  Anyone with known risk factors for hepatitis C. Sexually transmitted infections (STIs)  Get screened for STIs, including gonorrhea and chlamydia, if: ? You are sexually active and are younger than 76 years of age. ? You are older than 76 years of age and your health care provider tells you that you are at risk for this type of infection. ? Your sexual activity has changed since you were last screened, and you are at increased risk for chlamydia or gonorrhea. Ask your health care provider if   you are at risk.  Ask your health care provider about whether you are at high risk for HIV. Your health care provider may recommend a prescription medicine to help prevent HIV infection. If you choose to take medicine to prevent HIV, you should first get tested for HIV. You should then be tested every 3 months for as long as you are taking the medicine. Pregnancy  If you are about to stop having your period (premenopausal) and  you may become pregnant, seek counseling before you get pregnant.  Take 400 to 800 micrograms (mcg) of folic acid every day if you become pregnant.  Ask for birth control (contraception) if you want to prevent pregnancy. Osteoporosis and menopause Osteoporosis is a disease in which the bones lose minerals and strength with aging. This can result in bone fractures. If you are 65 years old or older, or if you are at risk for osteoporosis and fractures, ask your health care provider if you should:  Be screened for bone loss.  Take a calcium or vitamin D supplement to lower your risk of fractures.  Be given hormone replacement therapy (HRT) to treat symptoms of menopause. Follow these instructions at home: Lifestyle  Do not use any products that contain nicotine or tobacco, such as cigarettes, e-cigarettes, and chewing tobacco. If you need help quitting, ask your health care provider.  Do not use street drugs.  Do not share needles.  Ask your health care provider for help if you need support or information about quitting drugs. Alcohol use  Do not drink alcohol if: ? Your health care provider tells you not to drink. ? You are pregnant, may be pregnant, or are planning to become pregnant.  If you drink alcohol: ? Limit how much you use to 0-1 drink a day. ? Limit intake if you are breastfeeding.  Be aware of how much alcohol is in your drink. In the [place] one drink equals one 12 oz bottle of beer (355 mL), one 5 oz glass of wine (148 mL), or one 1 oz glass of hard liquor (44 mL). General instructions  Schedule regular health, dental, and eye exams.  Stay current with your vaccines.  Tell your health care provider if: ? You often feel depressed. ? You have ever been abused or do not feel safe at home. Summary  Adopting a healthy lifestyle and getting preventive care are important in promoting health and wellness.  Follow your health care provider's instructions about healthy  diet, exercising, and getting tested or screened for diseases.  Follow your health care provider's instructions on monitoring your cholesterol and blood pressure. This information is not intended to replace advice given to you by your health care provider. Make sure you discuss any questions you have with your health care provider. Document Released: 11/13/2010 Document Revised: 04/23/2018 Document Reviewed: 04/23/2018 Elsevier Patient Education  2020 Elsevier Inc.  

## 2019-03-20 LAB — CBC
Hematocrit: 39.6 % (ref 34.0–46.6)
Hemoglobin: 13.1 g/dL (ref 11.1–15.9)
MCH: 31.2 pg (ref 26.6–33.0)
MCHC: 33.1 g/dL (ref 31.5–35.7)
MCV: 94 fL (ref 79–97)
Platelets: 176 10*3/uL (ref 150–450)
RBC: 4.2 x10E6/uL (ref 3.77–5.28)
RDW: 14.5 % (ref 11.7–15.4)
WBC: 6.5 10*3/uL (ref 3.4–10.8)

## 2019-03-20 LAB — HEMOGLOBIN A1C
Est. average glucose Bld gHb Est-mCnc: 123 mg/dL
Hgb A1c MFr Bld: 5.9 % — ABNORMAL HIGH (ref 4.8–5.6)

## 2019-03-23 ENCOUNTER — Telehealth: Payer: Self-pay

## 2019-03-23 NOTE — Progress Notes (Addendum)
Subjective:     Patient ID: Pamela Key , female    DOB: 1940-07-16 , 78 y.o.   MRN: [id number]   Chief Complaint  Patient presents with  . Annual Exam  . Diabetes  . Hypertension    HPI  She presents today for a full physical examination.  She is no longer followed by GYN.  She reports having no specific concerns or complaints at this time.   Diabetes She presents for her follow-up diabetic visit. She has type 2 diabetes mellitus. Her disease course has been stable. There are no hypoglycemic associated symptoms. There are no diabetic associated symptoms. Pertinent negatives for diabetes include no blurred vision and no chest pain. There are no hypoglycemic complications. There are no diabetic complications. Risk factors for coronary artery disease include diabetes mellitus, dyslipidemia and hypertension. She is following a generally healthy diet. She participates in exercise intermittently. Her home blood glucose trend is fluctuating minimally. Her breakfast blood glucose is taken between 8-9 am. Her breakfast blood glucose range is generally 90-110 mg/dl. An ACE inhibitor/angiotensin II receptor blocker is being taken. She does not see a podiatrist.Eye exam is not current.  Hypertension This is a chronic problem. The current episode started more than 1 year ago. The problem has been gradually improving since onset. The problem is controlled. Pertinent negatives include no blurred vision or chest pain. Risk factors for coronary artery disease include diabetes mellitus, dyslipidemia, post-menopausal state, obesity and sedentary lifestyle. Past treatments include ACE inhibitors. The current treatment provides moderate improvement. Compliance problems include exercise.  Hypertensive end-organ damage includes kidney disease.     Past Medical History:  Diagnosis Date  . Diabetes mellitus without complication ([place])   . Gout   . High cholesterol   . Hypertension      Family History   Problem Relation Age of Onset  . Diabetes Mother   . Hypertension Mother   . Cancer Father   . Hypertension Father      Current Outpatient Medications:  .  colchicine 0.6 MG tablet, Take 1 tablet (0.6 mg total) by mouth daily., Disp: 30 tablet, Rfl: 2 .  lisinopril (ZESTRIL) 20 MG tablet, Take 1 tablet (20 mg total) by mouth daily., Disp: 90 tablet, Rfl: 1 .  metFORMIN (GLUCOPHAGE) 500 MG tablet, Take 1 tablet (500 mg total) by mouth 2 (two) times daily., Disp: 180 tablet, Rfl: 1 .  Multiple Vitamin (MULTIVITAMIN WITH MINERALS) TABS tablet, Take 1 tablet by mouth daily with breakfast., Disp: , Rfl:  .  pravastatin (PRAVACHOL) 40 MG tablet, Take 1 tablet by mouth Monday - Friday, Disp: 75 tablet, Rfl: 1   No Known Allergies    The patient states she uses post menopausal status for birth control. Last LMP was No LMP recorded. Patient is postmenopausal.. Negative for Dysmenorrhea  Negative for: breast discharge, breast lump(s), breast pain and breast self exam. Associated symptoms include abnormal vaginal bleeding. Pertinent negatives include abnormal bleeding (hematology), anxiety, decreased libido, depression, difficulty falling sleep, dyspareunia, history of infertility, nocturia, sexual dysfunction, sleep disturbances, urinary incontinence, urinary urgency, vaginal discharge and vaginal itching. Diet regular.The patient states her exercise level is    . The patient's tobacco use is:  Social History   Tobacco Use  Smoking Status Former Smoker  . Packs/day: 0.25  . Types: Cigarettes  . Quit date: 65  . Years since quitting: 44.9  Smokeless Tobacco Never Used  Tobacco Comment   smoked at least 10 years, can't remember exact  years  . She has been exposed to passive smoke. The patient's alcohol use is:  Social History   Substance and Sexual Activity  Alcohol Use Yes   Comment: on occassion    Review of Systems  Constitutional: Negative.   HENT: Negative.   Eyes: Negative.   Negative for blurred vision.  Respiratory: Negative.   Cardiovascular: Negative.  Negative for chest pain.  Endocrine: Negative.   Genitourinary: Negative.   Musculoskeletal: Negative.   Skin: Negative.   Allergic/Immunologic: Negative.   Neurological: Negative.   Hematological: Negative.   Psychiatric/Behavioral: Negative.      Today's Vitals   03/19/19 1454  BP: 130/78  Pulse: 66  Temp: 98.7 F (37.1 C)  TempSrc: Oral  Weight: 178 lb 3.2 oz (80.8 kg)  Height: 5' 2.4" (1.585 m)  PainSc: 0-No pain   Body mass index is 32.18 kg/m.   Objective:  Physical Exam Vitals signs and nursing note reviewed.  Constitutional:      Appearance: Normal appearance. She is obese.  HENT:     Head: Normocephalic and atraumatic.     Right Ear: Tympanic membrane, ear canal and external ear normal.     Left Ear: Tympanic membrane, ear canal and external ear normal.     Nose:     Comments: masked    Mouth/Throat:     Comments: Deferred-masked Eyes:     Extraocular Movements: Extraocular movements intact.     Conjunctiva/sclera: Conjunctivae normal.     Pupils: Pupils are equal, round, and reactive to light.  Neck:     Musculoskeletal: Normal range of motion and neck supple.  Cardiovascular:     Rate and Rhythm: Normal rate and regular rhythm.     Pulses: Normal pulses.     Heart sounds: Normal heart sounds.  Pulmonary:     Effort: Pulmonary effort is normal.     Breath sounds: Normal breath sounds.  Chest:     Breasts: Tanner Score is 5.        Right: Normal.        Left: Normal.  Abdominal:     General: Bowel sounds are normal.     Palpations: Abdomen is soft.     Comments: Obese.  Genitourinary:    Comments: deferred Musculoskeletal: Normal range of motion.  Skin:    General: Skin is warm and dry.  Neurological:     General: No focal deficit present.     Mental Status: She is alert and oriented to person, place, and time.  Psychiatric:        Mood and Affect: Mood  normal.        Behavior: Behavior normal.         Assessment And Plan:     1. Routine general medical examination at health care facility  A full exam was performed. Importance of monthly self breast exams was discussed with the patient. PATIENT HAS BEEN ADVISED TO GET 30-45 MINUTES REGULAR EXERCISE NO LESS THAN FOUR TO FIVE DAYS PER WEEK - BOTH WEIGHTBEARING EXERCISES AND AEROBIC ARE RECOMMENDED.  SHE WAS ADVISED TO FOLLOW A HEALTHY DIET WITH AT LEAST SIX FRUITS/VEGGIES PER DAY, DECREASE INTAKE OF RED MEAT, AND TO INCREASE FISH INTAKE TO TWO DAYS PER WEEK.  MEATS/FISH SHOULD NOT BE FRIED, BAKED OR BROILED IS PREFERABLE.  I SUGGEST WEARING SPF 50 SUNSCREEN ON EXPOSED PARTS AND ESPECIALLY WHEN IN THE DIRECT SUNLIGHT FOR AN EXTENDED PERIOD OF TIME.  PLEASE AVOID FAST FOOD RESTAURANTS AND INCREASE YOUR  WATER INTAKE.   2. 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 I DISCUSSED WITH THE PATIENT AT LENGTH REGARDING THE GOALS OF GLYCEMIC CONTROL AND POSSIBLE LONG-TERM COMPLICATIONS.  I  ALSO STRESSED THE IMPORTANCE OF COMPLIANCE WITH HOME GLUCOSE MONITORING, DIETARY RESTRICTIONS INCLUDING AVOIDANCE OF SUGARY DRINKS/PROCESSED FOODS,  ALONG WITH REGULAR EXERCISE.  I  ALSO STRESSED THE IMPORTANCE OF ANNUAL EYE EXAMS, SELF FOOT CARE AND COMPLIANCE WITH OFFICE VISITS.  - CBC no Diff - Hemoglobin A1c  3. Essential hypertension  Chronic, fair control. She will continue with current meds. EKG performed, no acute changes noted.  .jmh  4. 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 Uric acid level checked at last visit. She is encouraged to take meds as directed, follow dietary advice and stay well hydrated.   5. Class 1 obesity due to excess calories with serious comorbidity and body mass index (BMI) of 32.0 to 32.9 in adult  Importance of achieving optimal weight to decrease  risk of cardiovascular disease and cancers was discussed with the patient in full detail. Importance of regular exercise was discussed with the patient. She is encouraged to start slowly - start with 10 minutes twice daily at least three to four days per week and to gradually build to 30 minutes five days weekly. She was given tips to incorporate more activity into her daily routine - take stairs when possible, park farther away from her job, grocery stores, etc.    Gwynneth Aliment, MD    THE PATIENT IS ENCOURAGED TO PRACTICE SOCIAL DISTANCING DUE TO THE COVID-19 PANDEMIC.

## 2019-03-30 ENCOUNTER — Ambulatory Visit: Payer: Self-pay

## 2019-03-30 ENCOUNTER — Telehealth: Payer: Self-pay

## 2019-03-30 DIAGNOSIS — IMO0002 Reserved for concepts with insufficient information to code with codable children: Secondary | ICD-10-CM

## 2019-03-30 DIAGNOSIS — I10 Essential (primary) hypertension: Secondary | ICD-10-CM

## 2019-03-30 DIAGNOSIS — E1165 Type 2 diabetes mellitus with hyperglycemia: Secondary | ICD-10-CM

## 2019-03-30 DIAGNOSIS — E1122 Type 2 diabetes mellitus with diabetic chronic kidney disease: Secondary | ICD-10-CM

## 2019-03-30 NOTE — Chronic Care Management (AMB) (Signed)
  Chronic Care Management   Outreach Note  03/30/2019 Name: Pamela Key MRN: [id number] DOB: [birth year]  Referred by: Glendale Chard, MD Reason for referral : Care Coordination   Second unsuccessful outbound call to the patient to follow up on resource needs and confirm receipt of mailed resources. SW left a HIPAA compliant voice message requesting a return call.  Follow Up Plan: SW will attempt a final outbound call to the patient over the next 21 days.  [name], BSW, CDP Social Worker, Certified Dementia Practitioner [facility] [phone]

## 2019-04-01 ENCOUNTER — Telehealth: Payer: Self-pay

## 2019-04-02 NOTE — Progress Notes (Signed)
This encounter was created in error - please disregard.

## 2019-04-08 ENCOUNTER — Encounter: Payer: Self-pay | Admitting: Internal Medicine

## 2019-04-17 ENCOUNTER — Ambulatory Visit: Payer: Self-pay

## 2019-04-17 DIAGNOSIS — E1122 Type 2 diabetes mellitus with diabetic chronic kidney disease: Secondary | ICD-10-CM

## 2019-04-17 DIAGNOSIS — E1165 Type 2 diabetes mellitus with hyperglycemia: Secondary | ICD-10-CM

## 2019-04-17 DIAGNOSIS — IMO0002 Reserved for concepts with insufficient information to code with codable children: Secondary | ICD-10-CM

## 2019-04-17 DIAGNOSIS — I10 Essential (primary) hypertension: Secondary | ICD-10-CM

## 2019-04-17 NOTE — Chronic Care Management (AMB) (Signed)
  Chronic Care Management   Social Work Follow Up Note  04/17/2019 Name: Pamela Key MRN: [id number] DOB: March 09, 1941  Pamela Key is a 78 y.o. year old female who is a primary care patient of Glendale Chard, MD. The CCM team was consulted for assistance with care coordination.   Review of patient status, including review of consultants reports, other relevant assessments, and collaboration with appropriate care team members and the patient's provider was performed as part of comprehensive patient evaluation and provision of chronic care management services.    SW placed an outbound call to the patient to review Advance Directives. The patient has yet to receive mailing, resent and scheduled follow up call in the next 6 weeks.  Outpatient Encounter Medications as of 04/17/2019  Medication Sig  . colchicine 0.6 MG tablet Take 1 tablet (0.6 mg total) by mouth daily.  [name] lisinopril (ZESTRIL) 20 MG tablet Take 1 tablet (20 mg total) by mouth daily.  . metFORMIN (GLUCOPHAGE) 500 MG tablet Take 1 tablet (500 mg total) by mouth 2 (two) times daily.  . Multiple Vitamin (MULTIVITAMIN WITH MINERALS) TABS tablet Take 1 tablet by mouth daily with breakfast.  . pravastatin (PRAVACHOL) 40 MG tablet Take 1 tablet by mouth Monday - Friday   No facility-administered encounter medications on file as of 04/17/2019.      Goals Addressed            This Visit's Progress     Patient Stated   . "I would like to learn more about advance directives" (pt-stated)       Current Barriers:  . Limited education about the importance of naming a healthcare power of attorney  Clinical Social Work Clinical Goal(s):  [name] Over the next 30 days, patient will verbalize basic understanding of Advanced Directives and importance of completion Not met due to  delay in patient receiving resource.  . 04/17/2019: Over the next 60 days the patient will work with SW to become more knowledgeable of Advance Directives   CCM SW Interventions: Completed 04/17/2019 . Outbound call to the patient to review mailed resource . Determined the patient has yet to receive information previously mailed on 10/5 . Provided brief verbal education regarding health care power of attorney and a living will . Mailed the patient an Advance Directive packet to her home address  Patient Self Care Activities:  . Can read and write at 6th grading reading level . Can identify next of kin, power or attorney, guardian, or primary caregiver . Performs ADLs independently  Please see past updates related to this goal by clicking on the "Past Updates" button in the selected goal           Follow Up Plan: SW will follow up with patient by phone over the next 6 weeks   [name], BSW, CDP Social Worker, Certified Dementia Practitioner [facility] [phone]

## 2019-04-30 ENCOUNTER — Telehealth: Payer: Self-pay

## 2019-04-30 DIAGNOSIS — H40013 Open angle with borderline findings, low risk, bilateral: Secondary | ICD-10-CM | POA: Diagnosis not present

## 2019-04-30 DIAGNOSIS — H2513 Age-related nuclear cataract, bilateral: Secondary | ICD-10-CM | POA: Diagnosis not present

## 2019-04-30 DIAGNOSIS — E119 Type 2 diabetes mellitus without complications: Secondary | ICD-10-CM | POA: Diagnosis not present

## 2019-04-30 LAB — HM DIABETES EYE EXAM

## 2019-04-30 NOTE — Telephone Encounter (Signed)
The pt's son called and wanted to know if he had to wait unit his mother has an appt to speak with Dr. Baird Cancer about his mom because he isn't listed on her HIPPA form.  The son [name] was told that his mother can come by the office to fill out a form allowing the office to speak to him about her medical issues and a link was texted to the pt so that she can sign up for [facility] and then she can give her son her mychart information.

## 2019-05-04 ENCOUNTER — Encounter: Payer: Self-pay | Admitting: Internal Medicine

## 2019-05-21 ENCOUNTER — Telehealth: Payer: Self-pay

## 2019-06-02 ENCOUNTER — Telehealth: Payer: Self-pay

## 2019-06-02 ENCOUNTER — Ambulatory Visit: Payer: Self-pay

## 2019-06-02 DIAGNOSIS — E1165 Type 2 diabetes mellitus with hyperglycemia: Secondary | ICD-10-CM

## 2019-06-02 DIAGNOSIS — E1122 Type 2 diabetes mellitus with diabetic chronic kidney disease: Secondary | ICD-10-CM

## 2019-06-02 DIAGNOSIS — IMO0002 Reserved for concepts with insufficient information to code with codable children: Secondary | ICD-10-CM

## 2019-06-02 DIAGNOSIS — I10 Essential (primary) hypertension: Secondary | ICD-10-CM

## 2019-06-02 NOTE — Chronic Care Management (AMB) (Signed)
  Chronic Care Management   Outreach Note  06/02/2019 Name: Pamela Key MRN: [id number] DOB: Sep 12, 1940  Referred by: [name], MD Reason for referral : Care Coordination   SW placed an unsuccessful outbound call to the patient to follow up on goal progression and assist with care coordination. SW left a HIPAA compliant voice message requesting a return call.  Follow Up Plan: A member of the care management team will outreach the patient over the next 14 days.  [name], BSW, CDP Social Worker, Certified Dementia Practitioner [facility] [phone]

## 2019-06-10 ENCOUNTER — Ambulatory Visit: Payer: Self-pay

## 2019-06-10 DIAGNOSIS — E1165 Type 2 diabetes mellitus with hyperglycemia: Secondary | ICD-10-CM

## 2019-06-10 DIAGNOSIS — E1122 Type 2 diabetes mellitus with diabetic chronic kidney disease: Secondary | ICD-10-CM

## 2019-06-10 DIAGNOSIS — I10 Essential (primary) hypertension: Secondary | ICD-10-CM

## 2019-06-10 DIAGNOSIS — IMO0002 Reserved for concepts with insufficient information to code with codable children: Secondary | ICD-10-CM

## 2019-06-10 NOTE — Chronic Care Management (AMB) (Signed)
Chronic Care Management    Social Work Follow Up Note  06/10/2019 Name: ONNIE ALATORRE MRN: [id number] DOB: Nov 30, 1940  MARGARITA CROKE is a 79 y.o. year old female who is a primary care patient of Glendale Chard, MD. The CCM team was consulted for assistance with care coordination.   Review of patient status, including review of consultants reports, other relevant assessments, and collaboration with appropriate care team members and the patient's provider was performed as part of comprehensive patient evaluation and provision of chronic care management services.    SW placed an outbound call to the patient to assist with care coordination.   Outpatient Encounter Medications as of 06/10/2019  Medication Sig  . colchicine 0.6 MG tablet Take 1 tablet (0.6 mg total) by mouth daily.  [name] lisinopril (ZESTRIL) 20 MG tablet Take 1 tablet (20 mg total) by mouth daily.  . metFORMIN (GLUCOPHAGE) 500 MG tablet Take 1 tablet (500 mg total) by mouth 2 (two) times daily.  . Multiple Vitamin (MULTIVITAMIN WITH MINERALS) TABS tablet Take 1 tablet by mouth daily with breakfast.  . pravastatin (PRAVACHOL) 40 MG tablet Take 1 tablet by mouth Monday - Friday   No facility-administered encounter medications on file as of 06/10/2019.     Goals Addressed            This Visit's Progress     Patient Stated   . COMPLETED: "I need help with transportation" (pt-stated)       Current Barriers:  . Financial constraints related to ability to afford a vehicle . Limited education about health plan benefit  Clinical Social Work Clinical Goal(s):  [name] Over the next 30 days the patient will follow up with her health plan as directed by SW to gain a better understanding of transportation benefits  CCM SW Interventions: Completed 06/09/2018 . Outbound call placed to the patient to review Medicare transportation benefit under her new [employer] coverage . Reviewed 3 day scheduling protocol and how to contact [facility]  transportation services . Encouraged the patient to contact SW directly with future transportation concerns  Patient Self Care Activities:  . Self administers medications as prescribed . Calls pharmacy for medication refills . Performs ADL's independently . Does not attend all scheduled provider appointments  Please see past updates related to this goal by clicking on the "Past Updates" button in the selected goal      . COMPLETED: "I would like to learn more about advance directives" (pt-stated)       Current Barriers:  . Limited education about the importance of naming a healthcare power of attorney  Clinical Social Work Clinical Goal(s):  [name] Over the next 30 days, patient will verbalize basic understanding of Advanced Directives and importance of completion Not met due to  delay in patient receiving resource.  . 04/17/2019: Over the next 60 days the patient will work with SW to become more knowledgeable of Advance Directives  CCM SW Interventions: Completed 06/09/2018 . Outbound call to the patient to review mailed resource . Determined the patient has yet to receive information previously mailed on 10/5 and again in December 2020 . Provided verbal education to the patient regarding advance directive forms . Assessed for patient interest in completion- the patient is unsure at this time . Advised the patient to review document at next office visit in March if she decides she would like to complete one  Patient Self Care Activities:  . Can read and write at 6th grading reading level .  Can identify next of kin, power or attorney, guardian, or primary caregiver . Performs ADLs independently  Please see past updates related to this goal by clicking on the "Past Updates" button in the selected goal           Follow Up Plan: SW will follow up with patient by phone over the next 6 weeks   [name], BSW, CDP Social Worker, Certified Dementia Practitioner [place] [facility] [place]  [facility] [phone]  Total time spent performing care coordination and/or care management activities with the patient by phone or face to face = 14 minutes.

## 2019-06-10 NOTE — Patient Instructions (Signed)
Social Worker Visit Information  Goals we discussed today:  Goals Addressed            This Visit's Progress     Patient Stated   . COMPLETED: "I need help with transportation" (pt-stated)       Current Barriers:  . Financial constraints related to ability to afford a vehicle . Limited education about health plan benefit  Clinical Social Work Clinical Goal(s):  [name] Over the next 30 days the patient will follow up with her health plan as directed by SW to gain a better understanding of transportation benefits  CCM SW Interventions: Completed 06/09/2018 . Outbound call placed to the patient to review Medicare transportation benefit under her new [employer] coverage . Reviewed 3 day scheduling protocol and how to contact [facility] transportation services . Encouraged the patient to contact SW directly with future transportation concerns  Patient Self Care Activities:  . Self administers medications as prescribed . Calls pharmacy for medication refills . Performs ADL's independently . Does not attend all scheduled provider appointments  Please see past updates related to this goal by clicking on the "Past Updates" button in the selected goal      . COMPLETED: "I would like to learn more about advance directives" (pt-stated)       Current Barriers:  . Limited education about the importance of naming a healthcare power of attorney  Clinical Social Work Clinical Goal(s):  [name] Over the next 30 days, patient will verbalize basic understanding of Advanced Directives and importance of completion Not met due to  delay in patient receiving resource.  . 04/17/2019: Over the next 60 days the patient will work with SW to become more knowledgeable of Advance Directives  CCM SW Interventions: Completed 06/09/2018 . Outbound call to the patient to review mailed resource . Determined the patient has yet to receive information previously mailed on 10/5 and again in December 2020 . Provided verbal  education to the patient regarding advance directive forms . Assessed for patient interest in completion- the patient is unsure at this time . Advised the patient to review document at next office visit in March if she decides she would like to complete one  Patient Self Care Activities:  . Can read and write at 6th grading reading level . Can identify next of kin, power or attorney, guardian, or primary caregiver . Performs ADLs independently  Please see past updates related to this goal by clicking on the "Past Updates" button in the selected goal          Follow Up Plan: SW will follow up with patient by phone over the next 6 weeks   [name], BSW, CDP Social Worker, Certified Dementia Practitioner [place] [facility] [place] [facility] [phone]

## 2019-06-15 ENCOUNTER — Telehealth: Payer: Self-pay

## 2019-06-15 NOTE — Telephone Encounter (Signed)
The Pamela Key's son Tobin Chad said that his mother is experiencing signs of dementia and that she needed to be evaluated because she is forgetting things that she has to do for her daily living and that the family just found out that the Pamela Key has been living with bed bugs.  The son scheduled an appt for the Pamela Key.

## 2019-06-17 ENCOUNTER — Telehealth (INDEPENDENT_AMBULATORY_CARE_PROVIDER_SITE_OTHER): Payer: Medicare HMO | Admitting: Nurse Practitioner

## 2019-06-17 ENCOUNTER — Other Ambulatory Visit: Payer: Self-pay

## 2019-06-17 ENCOUNTER — Encounter: Payer: Self-pay | Admitting: Nurse Practitioner

## 2019-06-17 VITALS — BP 154/67 | HR 73 | Wt 178.1 lb

## 2019-06-17 DIAGNOSIS — E1165 Type 2 diabetes mellitus with hyperglycemia: Secondary | ICD-10-CM

## 2019-06-17 DIAGNOSIS — R413 Other amnesia: Secondary | ICD-10-CM

## 2019-06-17 DIAGNOSIS — N182 Chronic kidney disease, stage 2 (mild): Secondary | ICD-10-CM

## 2019-06-17 DIAGNOSIS — IMO0002 Reserved for concepts with insufficient information to code with codable children: Secondary | ICD-10-CM

## 2019-06-17 DIAGNOSIS — E1122 Type 2 diabetes mellitus with diabetic chronic kidney disease: Secondary | ICD-10-CM | POA: Diagnosis not present

## 2019-06-17 NOTE — Progress Notes (Signed)
Virtual Visit via Video   This visit type was conducted due to national recommendations for restrictions regarding the COVID-19 Pandemic (e.g. social distancing) in an effort to limit this patient's exposure and mitigate transmission in our community.  Due to her co-morbid illnesses, this patient is at least at moderate risk for complications without adequate follow up.  This format is felt to be most appropriate for this patient at this time.  All issues noted in this document were discussed and addressed.  A limited physical exam was performed with this format.    This visit type was conducted due to national recommendations for restrictions regarding the COVID-19 Pandemic (e.g. social distancing) in an effort to limit this patient's exposure and mitigate transmission in our community.  Patients identity confirmed using two different identifiers.  This format is felt to be most appropriate for this patient at this time.  All issues noted in this document were discussed and addressed.  No physical exam was performed (except for noted visual exam findings with Video Visits).    Date:  06/28/2019   ID:  Pamela Key, DOB 1940/07/26, MRN [id number]  Patient Location:  Home - spoke with Miguel Dibble  Provider location:   Office    Chief Complaint:    History of Present Illness:    Pamela Key is a 79 y.o. female who presents via video conferencing for a telehealth visit today.    The patient does not have symptoms concerning for COVID-19 infection (fever, chills, cough, or new shortness of breath).   She does not know why she is having a visit.    She is out of Lisinopril. She is not taking metformin she is taking the sugar balance  She denies an current issues   Spoke to her son [place] who is concerned the patient is having memory problems. She has not been paying her bills and has men staying with her she calls her nephews however he says they are no  relation to him.  He does not feel she has been taking her medications regularly.     Past Medical History:  Diagnosis Date  . Diabetes mellitus without complication (HCC)   . Gout   . High cholesterol   . Hypertension    Past Surgical History:  Procedure Laterality Date  . TUBAL LIGATION  1976     Current Meds  Medication Sig  . Multiple Vitamin (MULTIVITAMIN WITH MINERALS) TABS tablet Take 1 tablet by mouth daily with breakfast.     Allergies:   Patient has no known allergies.   Social History   Tobacco Use  . Smoking status: Former Smoker    Packs/day: 0.25    Types: Cigarettes    Quit date: 1976    Years since quitting: 45.1  . Smokeless tobacco: Never Used  . Tobacco comment: smoked at least 10 years, can't remember exact years  Substance Use Topics  . Alcohol use: Yes    Comment: on occassion  . Drug use: No     Family Hx: The patient's family history includes Cancer in her father; Diabetes in her mother; Hypertension in her father and mother.  ROS:   Please see the history of present illness.    Review of Systems  Constitutional: Negative.   Respiratory: Negative.  Negative for cough and wheezing.   Cardiovascular: Negative.   Neurological: Negative for dizziness and tremors.  Psychiatric/Behavioral: Positive for memory loss.    All other systems reviewed and  are negative.   Labs/Other Tests and Data Reviewed:    Recent Labs: 12/31/2018: ALT 16 02/11/2019: BUN 12; Creatinine, Ser 0.73; Potassium 3.7; Sodium 145 03/19/2019: Hemoglobin 13.1; Platelets 176   Recent Lipid Panel Lab Results  Component Value Date/Time   CHOL 165 06/17/2018 03:15 PM   TRIG 124 06/17/2018 03:15 PM   HDL 55 06/17/2018 03:15 PM   CHOLHDL 3.0 06/17/2018 03:15 PM   LDLCALC 85 06/17/2018 03:15 PM    Wt Readings from Last 3 Encounters:  06/17/19 178 lb 2.1 oz (80.8 kg)  03/19/19 178 lb 3.2 oz (80.8 kg)  03/19/19 178 lb 3.2 oz (80.8 kg)     Exam:    Vital Signs:  BP  (!) 154/67 (BP Location: Left Arm, Patient Position: Sitting, Cuff Size: Small)   Pulse 73   Wt 178 lb 2.1 oz (80.8 kg)   BMI 32.16 kg/m     Physical Exam  Constitutional: She is oriented to person, place, and time and well-developed, well-nourished, and in no distress. No distress.  Pulmonary/Chest: Effort normal. No respiratory distress.  Neurological: She is alert and oriented to person, place, and time.  Psychiatric: Mood, memory, affect and judgment normal.    ASSESSMENT & PLAN:    1. Memory deficit  She is able to answer questions to the memory score test however her son describes going through money, not paying bills and likely not eating well  Will refer to CCM to see about any resources - Referral to Chronic Care Management Services  2. 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  The importance of social distancing was discussed today.  Patient Risk:   After full review of this patients clinical status, I feel that they are at least moderate risk at this time.  Time:   Today, I have spent 20 minutes/ seconds with the patient and speaking with the son with telehealth technology discussing above diagnoses.     Medication Adjustments/Labs and Tests Ordered: Current medicines are reviewed at length with the patient today.  Concerns regarding medicines are outlined above.   Tests Ordered: Orders Placed This Encounter  Procedures  . Referral to Chronic Care Management Services    Medication Changes: No orders of the defined types were placed in this encounter.   Disposition:  Follow up PRN  Signed, [name], FNP

## 2019-06-25 ENCOUNTER — Ambulatory Visit: Payer: Self-pay | Admitting: Internal Medicine

## 2019-06-28 ENCOUNTER — Encounter: Payer: Self-pay | Admitting: Nurse Practitioner

## 2019-07-06 ENCOUNTER — Ambulatory Visit: Payer: Self-pay

## 2019-07-06 ENCOUNTER — Telehealth: Payer: Self-pay

## 2019-07-06 DIAGNOSIS — R413 Other amnesia: Secondary | ICD-10-CM

## 2019-07-06 NOTE — Chronic Care Management (AMB) (Signed)
  Chronic Care Management   Outreach Note  07/06/2019 Name: Pamela Key MRN: [id number] DOB: [birth year]  Referred by: [name], MD Reason for referral : Care Coordination   SW received new referral following recent telemedicine visit with Arnette Felts, FNP due to patients son concerns surrounding memory deficits. The patient is currently enrolled in embedded case management services. SW placed an unsuccessful outbound call to the patient in an attempt to discuss new referral reason. Unfortunately, the patients voice mailbox was full which prohibited SW from leaving a HIPAA compliant voice message requesting a return call. SW reviewed patient chart, unable to locate DPR on file. Patients son who reported concerns of memory loss is not listed under snapshot.   Follow Up Plan: SW will follow up with the patient over the next 7 days.  [name], BSW, CDP Social Worker, Certified Dementia Practitioner [facility] Care Management [phone]

## 2019-07-10 ENCOUNTER — Ambulatory Visit: Payer: Self-pay

## 2019-07-10 DIAGNOSIS — E1165 Type 2 diabetes mellitus with hyperglycemia: Secondary | ICD-10-CM

## 2019-07-10 NOTE — Chronic Care Management (AMB) (Signed)
  Chronic Care Management    Social Work Follow Up Note  07/10/2019 Name: Pamela Key MRN: [id number] DOB: Sep 28, 1940  Pamela Key is a 79 y.o. year old female who is a primary care patient of Glendale Chard, MD. The CCM team was consulted for assistance with care coordination.   Review of patient status, including review of consultants reports, other relevant assessments, and collaboration with appropriate care team members and the patient's provider was performed as part of comprehensive patient evaluation and provision of chronic care management services.    SDOH (Social Determinants of Health) assessments performed: Yes SDOH Interventions     Most Recent Value  SDOH Interventions  SDOH Interventions for the Following Domains  Housing  Housing Interventions  [id number] Referral        Outpatient Encounter Medications as of 07/10/2019  Medication Sig  . colchicine 0.6 MG tablet Take 1 tablet (0.6 mg total) by mouth daily. (Patient not taking: Reported on 06/17/2019)  . lisinopril (ZESTRIL) 20 MG tablet Take 1 tablet (20 mg total) by mouth daily. (Patient not taking: Reported on 06/17/2019)  . metFORMIN (GLUCOPHAGE) 500 MG tablet Take 1 tablet (500 mg total) by mouth 2 (two) times daily. (Patient not taking: Reported on 06/17/2019)  . Multiple Vitamin (MULTIVITAMIN WITH MINERALS) TABS tablet Take 1 tablet by mouth daily with breakfast.  . pravastatin (PRAVACHOL) 40 MG tablet Take 1 tablet by mouth Monday - Friday (Patient not taking: Reported on 06/17/2019)   No facility-administered encounter medications on file as of 07/10/2019.     Goals Addressed            This Visit's Progress     Patient Stated   . "my bathroom has mold in it and the floor is weak" (pt-stated)       Current Barriers:  . Financial constraints related to ability to perform home repair . Lacks knowledge of community resource: [employer]   Clinical Social Work Clinical  Goal(s):  [name] Over the next 60 days the patient will work with SW to explore community resources to assist with home repair needs. Goal not met . New 07/10/19- Over the next 90 days the patient will work with SW to identify resources to assist with home repair.  CCM SW Interventions: Completed 07/10/2019 . Outbound call placed to the patient to assist with care coordination needs . Determined the patient does not recall receiving information from the [place] [facility] . Obtained consent to place referral to [employer] as a second opportunity o Placed referral via [id number] . Scheduled follow up call to the patient over the next 3 weeks  Patient Self Care Activities:  . Performs ADL's independently . Performs IADL's independently . Unable to independently perform home modification needs  Please see past updates related to this goal by clicking on the "Past Updates" button in the selected goal          Follow Up Plan: SW will follow up with patient by phone over the next three weeks.   [name], BSW, CDP Social Worker, Certified Dementia Practitioner [place] [facility] [place] [facility] [phone]  Total time spent performing care coordination and/or care management activities with the patient by phone or face to face = 8 minutes.

## 2019-07-10 NOTE — Patient Instructions (Signed)
Social Worker Visit Information  Goals we discussed today:  Goals Addressed            This Visit's Progress     Patient Stated   . "my bathroom has mold in it and the floor is weak" (pt-stated)       Current Barriers:  . Financial constraints related to ability to perform home repair . Lacks knowledge of community resource: [employer]   Clinical Social Work Clinical Goal(s):  [name] Over the next 60 days the patient will work with SW to explore community resources to assist with home repair needs. Goal not met . New 07/10/19- Over the next 90 days the patient will work with SW to identify resources to assist with home repair.  CCM SW Interventions: Completed 07/10/2019 . Outbound call placed to the patient to assist with care coordination needs . Determined the patient does not recall receiving information from the [place] [facility] . Obtained consent to place referral to [employer] as a second opportunity o Placed referral via [id number] . Scheduled follow up call to the patient over the next 3 weeks  Patient Self Care Activities:  . Performs ADL's independently . Performs IADL's independently . Unable to independently perform home modification needs  Please see past updates related to this goal by clicking on the "Past Updates" button in the selected goal          Follow Up Plan: SW will follow up with patient by phone over the next 3 weeks.   [name], BSW, CDP Social Worker, Certified Dementia Practitioner [place] [facility] [place] [facility] [phone]

## 2019-07-17 ENCOUNTER — Telehealth: Payer: Self-pay

## 2019-07-17 ENCOUNTER — Other Ambulatory Visit: Payer: Self-pay

## 2019-07-17 ENCOUNTER — Ambulatory Visit: Payer: Self-pay

## 2019-07-17 DIAGNOSIS — E1165 Type 2 diabetes mellitus with hyperglycemia: Secondary | ICD-10-CM

## 2019-07-17 DIAGNOSIS — R413 Other amnesia: Secondary | ICD-10-CM

## 2019-07-17 DIAGNOSIS — IMO0002 Reserved for concepts with insufficient information to code with codable children: Secondary | ICD-10-CM

## 2019-07-17 DIAGNOSIS — E1122 Type 2 diabetes mellitus with diabetic chronic kidney disease: Secondary | ICD-10-CM

## 2019-07-17 DIAGNOSIS — I10 Essential (primary) hypertension: Secondary | ICD-10-CM

## 2019-07-20 NOTE — Chronic Care Management (AMB) (Signed)
  Chronic Care Management   Outreach Note  07/20/2019 Name: Pamela Key MRN: [id number] DOB: [birth year]  Referred by: [name], MD Reason for referral : Chronic Care Management (RQ Initial Call - HTN, Uncontrolled DM with stage II chronic kidney disease)   An unsuccessful telephone outreach was attempted today. The patient was referred to the case management team for assistance with care management and care coordination.   Follow Up Plan: Telephone follow up appointment with care management team member scheduled for: 08/14/19  Delsa Sale, RN, BSN, CCM Care Management Coordinator [facility] Care Management/[facility]  Direct Phone: [phone]

## 2019-07-21 ENCOUNTER — Telehealth: Payer: Self-pay

## 2019-07-21 NOTE — Telephone Encounter (Signed)
The pt's son Tobin [place] called to check on some referrals for his mother and said that he brought his mother to the office to sign a consent form for him to be able to discuss his mother's medical information.  [name] was told that his mother didn't sign the request and that she said was coming to sign it and never did.  [name] said that he would get his mother back to the office to sign the form.

## 2019-07-22 ENCOUNTER — Telehealth: Payer: Self-pay

## 2019-07-22 ENCOUNTER — Ambulatory Visit: Payer: Self-pay

## 2019-07-22 DIAGNOSIS — I10 Essential (primary) hypertension: Secondary | ICD-10-CM

## 2019-07-22 DIAGNOSIS — R413 Other amnesia: Secondary | ICD-10-CM

## 2019-07-22 DIAGNOSIS — E1165 Type 2 diabetes mellitus with hyperglycemia: Secondary | ICD-10-CM

## 2019-07-22 NOTE — Chronic Care Management (AMB) (Signed)
  Chronic Care Management   Outreach Note  07/22/2019 Name: Pamela Key MRN: [id number] DOB: [birth year]  Referred by: [name], MD Reason for referral : Care Coordination   SW received a voice message from the patients son, [place], requesting SW contact him to discuss concerns regarding the patient memory and resource needs. Upon chart review it is noted the patient has not signed a DPR allowing staff to speak with her son.  SW placed an unsuccessful outbound call to the patient to assess for SDOH (social determinants of health) and obtain verbal permission to speak with her son. Unfortunately, the patient did not answer and her voice mailbox was full.   Follow Up Plan: The care management team will reach out to the patient again over the next 14 days.   [name], BSW, CDP Social Worker, Certified Dementia Practitioner [facility] Care Management [phone]

## 2019-07-23 ENCOUNTER — Ambulatory Visit (INDEPENDENT_AMBULATORY_CARE_PROVIDER_SITE_OTHER): Payer: Medicare Other | Admitting: Internal Medicine

## 2019-07-23 ENCOUNTER — Encounter: Payer: Self-pay | Admitting: Internal Medicine

## 2019-07-23 VITALS — BP 132/84 | HR 68 | Temp 98.4°F | Ht 62.4 in | Wt 188.6 lb

## 2019-07-23 DIAGNOSIS — E1165 Type 2 diabetes mellitus with hyperglycemia: Secondary | ICD-10-CM | POA: Diagnosis not present

## 2019-07-23 DIAGNOSIS — I10 Essential (primary) hypertension: Secondary | ICD-10-CM

## 2019-07-23 DIAGNOSIS — E6609 Other obesity due to excess calories: Secondary | ICD-10-CM

## 2019-07-23 DIAGNOSIS — R413 Other amnesia: Secondary | ICD-10-CM

## 2019-07-23 DIAGNOSIS — Z9114 Patient's other noncompliance with medication regimen: Secondary | ICD-10-CM

## 2019-07-23 DIAGNOSIS — Z6834 Body mass index (BMI) 34.0-34.9, adult: Secondary | ICD-10-CM

## 2019-07-23 DIAGNOSIS — M1A3720 Chronic gout due to renal impairment, left ankle and foot, without tophus (tophi): Secondary | ICD-10-CM

## 2019-07-24 LAB — CMP14+EGFR
ALT: 23 IU/L (ref 0–32)
AST: 28 IU/L (ref 0–40)
Albumin/Globulin Ratio: 1.5 (ref 1.2–2.2)
Albumin: 4.4 g/dL (ref 3.7–4.7)
Alkaline Phosphatase: 58 IU/L (ref 39–117)
BUN/Creatinine Ratio: 15 (ref 12–28)
BUN: 13 mg/dL (ref 8–27)
Bilirubin Total: 0.4 mg/dL (ref 0.0–1.2)
CO2: 24 mmol/L (ref 20–29)
Calcium: 9.5 mg/dL (ref 8.7–10.3)
Chloride: 107 mmol/L — ABNORMAL HIGH (ref 96–106)
Creatinine, Ser: 0.88 mg/dL (ref 0.57–1.00)
GFR calc Af Amer: 74 mL/min/{1.73_m2} (ref >59)
GFR calc non Af Amer: 64 mL/min/{1.73_m2} (ref >59)
Globulin, Total: 2.9 g/dL (ref 1.5–4.5)
Glucose: 88 mg/dL (ref 65–99)
Potassium: 3.9 mmol/L (ref 3.5–5.2)
Sodium: 144 mmol/L (ref 134–144)
Total Protein: 7.3 g/dL (ref 6.0–8.5)

## 2019-07-24 LAB — HEMOGLOBIN A1C
Est. average glucose Bld gHb Est-mCnc: 128 mg/dL
Hgb A1c MFr Bld: 6.1 % — ABNORMAL HIGH (ref 4.8–5.6)

## 2019-07-24 LAB — LIPID PANEL
Chol/HDL Ratio: 3.1 ratio (ref 0.0–4.4)
Cholesterol, Total: 211 mg/dL — ABNORMAL HIGH (ref 100–199)
HDL: 69 mg/dL (ref >39)
LDL Chol Calc (NIH): 121 mg/dL — ABNORMAL HIGH (ref 0–99)
Triglycerides: 120 mg/dL (ref 0–149)
VLDL Cholesterol Cal: 21 mg/dL (ref 5–40)

## 2019-07-24 LAB — URIC ACID: Uric Acid: 8.8 mg/dL — ABNORMAL HIGH (ref 3.1–7.9)

## 2019-07-25 ENCOUNTER — Other Ambulatory Visit: Payer: Self-pay

## 2019-07-25 MED ORDER — PRAVASTATIN SODIUM 40 MG PO TABS: ORAL_TABLET | ORAL | 1 refills | Status: DC

## 2019-07-25 MED ORDER — LISINOPRIL 20 MG PO TABS: 20.0000 mg | ORAL_TABLET | Freq: Every day | ORAL | 1 refills | Status: DC

## 2019-07-27 NOTE — Progress Notes (Signed)
This visit occurred during the SARS-CoV-2 public health emergency.  Safety protocols were in place, including screening questions prior to the visit, additional usage of staff PPE, and extensive cleaning of exam room while observing appropriate contact time as indicated for disinfecting solutions.  Subjective:     Patient ID: Pamela Key , female    DOB: [birth year] , 79 y.o.   MRN: [id number]   Chief Complaint  Patient presents with  . Diabetes  . Hypertension    HPI  She presents today for DM/HTN check. She reports compliance with meds. She is alone today.   Diabetes She presents for her follow-up diabetic visit. She has type 2 diabetes mellitus. Her disease course has been stable. There are no hypoglycemic associated symptoms. Pertinent negatives for diabetes include no blurred vision and no chest pain. There are no hypoglycemic complications. Diabetic complications include nephropathy. Risk factors for coronary artery disease include diabetes mellitus, dyslipidemia, hypertension, obesity, post-menopausal and sedentary lifestyle. She is following a generally healthy diet. She never participates in exercise. Her home blood glucose trend is fluctuating minimally. Her breakfast blood glucose is taken between 8-9 am. Her breakfast blood glucose range is generally 90-110 mg/dl. An ACE inhibitor/angiotensin II receptor blocker is being taken. She does not see a podiatrist. Hypertension This is a chronic problem. The current episode started more than 1 year ago. The problem has been gradually improving since onset. The problem is controlled. Pertinent negatives include no blurred vision or chest pain. Risk factors for coronary artery disease include diabetes mellitus, dyslipidemia, post-menopausal state, obesity and sedentary lifestyle. Past treatments include ACE inhibitors. The current treatment provides moderate improvement. Compliance problems include exercise.  Hypertensive end-organ  damage includes kidney disease.     Past Medical History:  Diagnosis Date  . Diabetes mellitus without complication ([place])   . Gout   . High cholesterol   . Hypertension      Family History  Problem Relation Age of Onset  . Diabetes Mother   . Hypertension Mother   . Cancer Father   . Hypertension Father      Current Outpatient Medications:  [name]  Multiple Vitamin (MULTIVITAMIN WITH MINERALS) TABS tablet, Take 1 tablet by mouth daily with breakfast., Disp: , Rfl:  .  colchicine 0.6 MG tablet, Take 1 tablet (0.6 mg total) by mouth daily. (Patient not taking: Reported on 07/23/2019), Disp: 30 tablet, Rfl: 2 .  lisinopril (ZESTRIL) 20 MG tablet, Take 1 tablet (20 mg total) by mouth daily., Disp: 90 tablet, Rfl: 1 .  metFORMIN (GLUCOPHAGE) 500 MG tablet, Take 1 tablet (500 mg total) by mouth 2 (two) times daily. (Patient not taking: Reported on 07/23/2019), Disp: 180 tablet, Rfl: 1 .  pravastatin (PRAVACHOL) 40 MG tablet, Take 1 tablet by mouth Monday - Friday, Disp: 75 tablet, Rfl: 1   No Known Allergies   Review of Systems  Constitutional: Negative.   Eyes: Negative for blurred vision.  Respiratory: Negative.   Cardiovascular: Negative.  Negative for chest pain.  Gastrointestinal: Negative.   Neurological: Negative.        Pt denies having memory issues, but admits that she has not been taking meds as prescribed. She admits to "forgetting" sometimes. She reports living with her grandson.   Psychiatric/Behavioral: Negative.      Today's Vitals   07/23/19 1441  BP: 132/84  Pulse: 68  Temp: 98.4 F (36.9 C)  TempSrc: Oral  Weight: 188 lb 9.6 oz (85.5 kg)  Height: 5' 2.4" (1.585  m)   Body mass index is 34.05 kg/m.   Objective:  Physical Exam Vitals and nursing note reviewed.  Constitutional:      Appearance: Normal appearance. She is obese.  HENT:     Head: Normocephalic and atraumatic.  Cardiovascular:     Rate and Rhythm: Normal rate and regular rhythm.     Heart  sounds: Normal heart sounds.  Pulmonary:     Effort: Pulmonary effort is normal.     Breath sounds: Normal breath sounds.  Skin:    General: Skin is warm.  Neurological:     General: No focal deficit present.     Mental Status: She is alert.  Psychiatric:        Mood and Affect: Mood normal.        Behavior: Behavior normal.         Assessment And Plan:     1. Uncontrolled type 2 diabetes mellitus with hyperglycemia (HCC)  Chronic, I will check labs as listed below.  Unfortunately, she is not taking metformin as prescribed. I will make medication adjustments as needed.   - CMP14+EGFR - Lipid panel - Hemoglobin A1c  2. Essential hypertension  Chronic, fair control. She will continue with current meds. She is encouraged to avoid adding salt to her foods.   3. 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   - Uric acid  4. Class 1 obesity due to excess calories with serious comorbidity and body mass index (BMI) of 34.0 to 34.9 in adult  She is encouraged to strive for BMI less than 30 to decrease cardiac risk. Importance of regular exercise was discussed with the patient.   5. Memory deficit  I will refer patient to Neuro for further evaluation. She is already followed by CCM team.   - Ambulatory referral to Neurology    [name], MD    THE PATIENT IS ENCOURAGED TO PRACTICE SOCIAL DISTANCING DUE TO THE COVID-19 PANDEMIC.

## 2019-07-30 ENCOUNTER — Ambulatory Visit (INDEPENDENT_AMBULATORY_CARE_PROVIDER_SITE_OTHER): Payer: Medicare Other

## 2019-07-30 DIAGNOSIS — M1A3720 Chronic gout due to renal impairment, left ankle and foot, without tophus (tophi): Secondary | ICD-10-CM

## 2019-07-30 DIAGNOSIS — E1165 Type 2 diabetes mellitus with hyperglycemia: Secondary | ICD-10-CM

## 2019-07-30 DIAGNOSIS — I10 Essential (primary) hypertension: Secondary | ICD-10-CM

## 2019-07-30 DIAGNOSIS — N182 Chronic kidney disease, stage 2 (mild): Secondary | ICD-10-CM | POA: Diagnosis not present

## 2019-07-30 DIAGNOSIS — E1122 Type 2 diabetes mellitus with diabetic chronic kidney disease: Secondary | ICD-10-CM | POA: Diagnosis not present

## 2019-07-30 NOTE — Patient Instructions (Signed)
Social Worker Visit Information  Goals we discussed today:  Goals Addressed            This Visit's Progress   . Collaborate with RN CAse Manager to perform appropriate assessments to determine care management and care coordination needs       Current Barriers:  [name] Knowledge barriers related to the independent self-health management of chronic conditions including DM and HTN . Limited social support . Lacks knowledge of community resources to assist with SDOH needs  Social Work Clinical Goal(s):  [name] Over the next 30 days the patients will work with RN Case Manager to establish an individualized plan of care related to the management of patients identified chronic conditions . 07/30/2019- Over the next 45 days the patient will work with SW to address SDOH needs by identifying resources to assist with transportation and food insecurity  CCM SW Interventions: Completed 07/30/2019 . Successful outbound call placed to the patient to screen for SDOH (social determinants of health) . Identified challenges with transportation and food insecurity . Transportation o Educated the patient on [employer] available to assist with transportation to locations within the city o Initiated a SCAT application on behalf of the patient o Collaboration with the patients primary provider requesting completion of Part B of application . Food o Determined the patient has applied for assistance through food and nutrition services in the past but was denied due to income level o Mailed the patient a copy of "The [place]" to provide information on local food pantry sites o Educated the patient on [employer] program provided by [employer] o Placed referral to [employer] [facility] [place] via [id number]  Patient Self Care Activities:  . Does not adhere to provider recommendations re: home monitoring of fasting CBG readings and/or daily BP checks . Patient verbalizes understanding of  plan to work with SW to address SDOH needs  Please see past updates related to this goal by clicking on the "Past Updates" button in the selected goal          Materials Provided: Yes: mailed information on community resources  Follow Up Plan: SW will follow up with patient by phone over the next month   [name], BSW, CDP Social Worker, Certified Dementia Practitioner [facility] Care Management [phone]

## 2019-07-30 NOTE — Chronic Care Management (AMB) (Signed)
Chronic Care Management    Social Work Follow Up Note  07/30/2019 Name: DALI KRANER MRN: [id number] DOB: 10-04-1940  TYSHAY ADEE is a 79 y.o. year old female who is a primary care patient of Glendale Chard, MD. The CCM team was consulted for assistance with care coordination.   Review of patient status, including review of consultants reports, other relevant assessments, and collaboration with appropriate care team members and the patient's provider was performed as part of comprehensive patient evaluation and provision of chronic care management services.    SDOH (Social Determinants of Health) assessments performed: Yes SDOH Interventions     Most Recent Value  SDOH Interventions  SDOH Interventions for the Following Domains  Food Insecurity, Transportation  Food Insecurity Interventions  Other (Comment), [id number] Referral [mailed information on local food banks]  Transportation Interventions  SCAT (Specialized Community Area Transporation)       Outpatient Encounter Medications as of 07/30/2019  Medication Sig  . colchicine 0.6 MG tablet Take 1 tablet (0.6 mg total) by mouth daily. (Patient not taking: Reported on 07/23/2019)  . lisinopril (ZESTRIL) 20 MG tablet Take 1 tablet (20 mg total) by mouth daily.  . metFORMIN (GLUCOPHAGE) 500 MG tablet Take 1 tablet (500 mg total) by mouth 2 (two) times daily. (Patient not taking: Reported on 07/23/2019)  . Multiple Vitamin (MULTIVITAMIN WITH MINERALS) TABS tablet Take 1 tablet by mouth daily with breakfast.  . pravastatin (PRAVACHOL) 40 MG tablet Take 1 tablet by mouth Monday - Friday   No facility-administered encounter medications on file as of 07/30/2019.     Goals Addressed            This Visit's Progress   . Collaborate with RN CAse Manager to perform appropriate assessments to determine care management and care coordination needs       Current Barriers:  [name] Knowledge barriers related to the independent self-health  management of chronic conditions including DM and HTN . Limited social support . Lacks knowledge of community resources to assist with SDOH needs  Social Work Clinical Goal(s):  [name] Over the next 30 days the patients will work with RN Case Manager to establish an individualized plan of care related to the management of patients identified chronic conditions . 07/30/2019- Over the next 45 days the patient will work with SW to address SDOH needs by identifying resources to assist with transportation and food insecurity  CCM SW Interventions: Completed 07/30/2019 . Successful outbound call placed to the patient to screen for SDOH (social determinants of health) . Identified challenges with transportation and food insecurity . Transportation o Educated the patient on [employer] available to assist with transportation to locations within the city o Initiated a SCAT application on behalf of the patient o Collaboration with the patients primary provider requesting completion of Part B of application . Food o Determined the patient has applied for assistance through food and nutrition services in the past but was denied due to income level o Mailed the patient a copy of "The [place]" to provide information on local food pantry sites o Educated the patient on [employer] program provided by [employer] [facility] [place] referral to [employer] [facility] [place] via [id number]  Patient Self Care Activities:  . Does not adhere to provider recommendations re: home monitoring of fasting CBG readings and/or daily BP checks . Patient verbalizes understanding of plan to work with SW to address SDOH needs  Please see past updates related to  this goal by clicking on the "Past Updates" button in the selected goal          Follow Up Plan: SW will follow up with patient by phone over the next month   [name], BSW, CDP Social Worker, Certified Dementia Practitioner [facility] Care  Management [phone]  Total time spent performing care coordination and/or care management activities with the patient by phone or face to face = 23 minutes.

## 2019-08-05 ENCOUNTER — Ambulatory Visit: Payer: Self-pay

## 2019-08-05 DIAGNOSIS — M1A3720 Chronic gout due to renal impairment, left ankle and foot, without tophus (tophi): Secondary | ICD-10-CM

## 2019-08-05 DIAGNOSIS — E1165 Type 2 diabetes mellitus with hyperglycemia: Secondary | ICD-10-CM

## 2019-08-05 NOTE — Chronic Care Management (AMB) (Signed)
  Chronic Care Management    Social Work Follow Up Note  08/05/2019 Name: Pamela Key MRN: [id number] DOB: [birth year]  Pamela Key is a 79 y.o. year old female who is a primary care patient of [name], MD. The CCM team was consulted for assistance with care coordination.   Review of patient status, including review of consultants reports, other relevant assessments, and collaboration with appropriate care team members and the patient's provider was performed as part of comprehensive patient evaluation and provision of chronic care management services.      Outpatient Encounter Medications as of 08/05/2019  Medication Sig  . colchicine 0.6 MG tablet Take 1 tablet (0.6 mg total) by mouth daily. (Patient not taking: Reported on 07/23/2019)  . lisinopril (ZESTRIL) 20 MG tablet Take 1 tablet (20 mg total) by mouth daily.  . metFORMIN (GLUCOPHAGE) 500 MG tablet Take 1 tablet (500 mg total) by mouth 2 (two) times daily. (Patient not taking: Reported on 07/23/2019)  . Multiple Vitamin (MULTIVITAMIN WITH MINERALS) TABS tablet Take 1 tablet by mouth daily with breakfast.  . pravastatin (PRAVACHOL) 40 MG tablet Take 1 tablet by mouth Monday - Friday   No facility-administered encounter medications on file as of 08/05/2019.     Goals Addressed            This Visit's Progress   . Collaborate with RN CAse Manager to perform appropriate assessments to determine care management and care coordination needs       Current Barriers:  [name] Knowledge barriers related to the independent self-health management of chronic conditions including DM and HTN . Limited social support . Lacks knowledge of community resources to assist with SDOH needs  Social Work Clinical Goal(s):  [name] Over the next 30 days the patients will work with RN Case Manager to establish an individualized plan of care related to the management of patients identified chronic conditions . 07/30/2019- Over the next 45 days the  patient will work with SW to address SDOH needs by identifying resources to assist with transportation and food insecurity  CCM SW Interventions: Completed 08/05/2019 . Received completed SCAT part B application from provider . Collaboration with [name] to submit SCAT application on behalf of the patient  Patient Self Care Activities:  . Does not adhere to provider recommendations re: home monitoring of fasting CBG readings and/or daily BP checks . Patient verbalizes understanding of plan to work with SW to address SDOH needs  Please see past updates related to this goal by clicking on the "Past Updates" button in the selected goal          Follow Up Plan: SW will follow up with patient by phone over the next month   [name], BSW, CDP Social Worker, Certified Dementia Practitioner [facility] Care Management [phone]  Total time spent performing care coordination and/or care management activities with the patient by phone or face to face = 5 minutes.

## 2019-08-13 ENCOUNTER — Telehealth: Payer: Self-pay

## 2019-08-13 ENCOUNTER — Ambulatory Visit: Payer: Self-pay

## 2019-08-13 DIAGNOSIS — E1165 Type 2 diabetes mellitus with hyperglycemia: Secondary | ICD-10-CM

## 2019-08-13 NOTE — Chronic Care Management (AMB) (Signed)
  Chronic Care Management   Outreach Note  08/13/2019 Name: Pamela Key MRN: [id number] DOB: 1941/04/25  Referred by: [name], MD Reason for referral : Care Coordination   SW placed an unsuccessful outbound call to the patient to assess progression of patient goals. SW unable to leave a voice message requesting a return call due to the patients voice mailbox being full.  Follow Up Plan: The care management team will reach out to the patient again over the next 14 days.   [name], BSW, CDP Social Worker, Certified Dementia Practitioner [facility] Care Management [phone]

## 2019-08-14 ENCOUNTER — Telehealth: Payer: Self-pay

## 2019-08-18 ENCOUNTER — Telehealth: Payer: Self-pay

## 2019-08-18 ENCOUNTER — Ambulatory Visit: Payer: BC Managed Care – PPO | Admitting: Neurology

## 2019-08-18 NOTE — Telephone Encounter (Signed)
Pt did not show for their appt with Dr. Athar today.  

## 2019-08-19 ENCOUNTER — Ambulatory Visit: Payer: Self-pay

## 2019-08-19 DIAGNOSIS — I10 Essential (primary) hypertension: Secondary | ICD-10-CM

## 2019-08-19 DIAGNOSIS — E1165 Type 2 diabetes mellitus with hyperglycemia: Secondary | ICD-10-CM

## 2019-08-19 NOTE — Chronic Care Management (AMB) (Signed)
  Chronic Care Management    Social Work Follow Up Note  08/19/2019 Name: Pamela Key MRN: [id number] DOB: 1940-08-07  Pamela Key is a 79 y.o. year old female who is a primary care patient of [name], MD. The CCM team was consulted for assistance with care coordination.   Review of patient status, including review of consultants reports, other relevant assessments, and collaboration with appropriate care team members and the patient's provider was performed as part of comprehensive patient evaluation and provision of chronic care management services.    SDOH (Social Determinants of Health) assessments performed: No    Outpatient Encounter Medications as of 08/19/2019  Medication Sig  . colchicine 0.6 MG tablet Take 1 tablet (0.6 mg total) by mouth daily. (Patient not taking: Reported on 07/23/2019)  . lisinopril (ZESTRIL) 20 MG tablet Take 1 tablet (20 mg total) by mouth daily.  . metFORMIN (GLUCOPHAGE) 500 MG tablet Take 1 tablet (500 mg total) by mouth 2 (two) times daily. (Patient not taking: Reported on 07/23/2019)  . Multiple Vitamin (MULTIVITAMIN WITH MINERALS) TABS tablet Take 1 tablet by mouth daily with breakfast.  . pravastatin (PRAVACHOL) 40 MG tablet Take 1 tablet by mouth Monday - Friday   No facility-administered encounter medications on file as of 08/19/2019.     Goals Addressed            This Visit's Progress   . Collaborate with RN CAse Manager to perform appropriate assessments to determine care management and care coordination needs   On track    Current Barriers:  [name] Knowledge barriers related to the independent self-health management of chronic conditions including DM and HTN . Limited social support . Lacks knowledge of community resources to assist with SDOH needs  Social Work Clinical Goal(s):  [name] Over the next 30 days the patients will work with RN Case Manager to establish an individualized plan of care related to the management of patients  identified chronic conditions . 07/30/2019- Over the next 45 days the patient will work with SW to address SDOH needs by identifying resources to assist with transportation and food insecurity  CCM SW Interventions: Completed 08/19/2019 . Received communication via [id number] the patient referral to mobile meals was rejected due to demographic pieces missing including contact number . Collaboration with Truman Hayward with [employer] [facility] [place] to complete referral to [employer] . Received confirmation from Mr. Katrinka Blazing indicating the patient has been placed on the waiting list for mobile meals  Patient Self Care Activities:  . Does not adhere to provider recommendations re: home monitoring of fasting CBG readings and/or daily BP checks . Patient verbalizes understanding of plan to work with SW to address SDOH needs  Please see past updates related to this goal by clicking on the "Past Updates" button in the selected goal          Follow Up Plan: SW will follow up with patient by phone over the next month as previously scheduled.   [name], BSW, CDP Social Worker, Certified Dementia Practitioner [facility] Care Management [phone]  Total time spent performing care coordination and/or care management activities with the patient by phone or face to face = 8 minutes.

## 2019-08-20 ENCOUNTER — Encounter: Payer: Self-pay | Admitting: Internal Medicine

## 2019-08-24 ENCOUNTER — Telehealth: Payer: Self-pay

## 2019-08-31 ENCOUNTER — Ambulatory Visit: Payer: Self-pay

## 2019-08-31 ENCOUNTER — Telehealth: Payer: Self-pay

## 2019-08-31 ENCOUNTER — Other Ambulatory Visit: Payer: Self-pay

## 2019-08-31 DIAGNOSIS — E1165 Type 2 diabetes mellitus with hyperglycemia: Secondary | ICD-10-CM

## 2019-08-31 DIAGNOSIS — M1A3720 Chronic gout due to renal impairment, left ankle and foot, without tophus (tophi): Secondary | ICD-10-CM

## 2019-08-31 DIAGNOSIS — R413 Other amnesia: Secondary | ICD-10-CM

## 2019-08-31 DIAGNOSIS — N189 Chronic kidney disease, unspecified: Secondary | ICD-10-CM

## 2019-08-31 DIAGNOSIS — I10 Essential (primary) hypertension: Secondary | ICD-10-CM

## 2019-08-31 NOTE — Chronic Care Management (AMB) (Signed)
  Chronic Care Management   Outreach Note  08/31/2019 Name: Pamela Key MRN: [id number] DOB: 02-May-1941  Referred by: [name], MD Reason for referral : Chronic Care Management (RQ #2 Initial RN Call )   A second unsuccessful telephone outreach was attempted today. The patient was referred to the case management team for assistance with care management and care coordination.   Follow Up Plan: Telephone follow up appointment with care management team member scheduled for: 09/14/19 Unable to leave a voice message due to voicemail box is full.   Delsa Sale, RN, BSN, CCM Care Management Coordinator [facility] Care Management/[facility]  Direct Phone: [phone]

## 2019-09-02 ENCOUNTER — Ambulatory Visit (INDEPENDENT_AMBULATORY_CARE_PROVIDER_SITE_OTHER): Payer: Medicare HMO

## 2019-09-02 DIAGNOSIS — N189 Chronic kidney disease, unspecified: Secondary | ICD-10-CM

## 2019-09-02 DIAGNOSIS — E1165 Type 2 diabetes mellitus with hyperglycemia: Secondary | ICD-10-CM | POA: Diagnosis not present

## 2019-09-02 DIAGNOSIS — R413 Other amnesia: Secondary | ICD-10-CM

## 2019-09-02 DIAGNOSIS — I10 Essential (primary) hypertension: Secondary | ICD-10-CM

## 2019-09-02 DIAGNOSIS — M1A3720 Chronic gout due to renal impairment, left ankle and foot, without tophus (tophi): Secondary | ICD-10-CM | POA: Diagnosis not present

## 2019-09-03 NOTE — Chronic Care Management (AMB) (Signed)
  Chronic Care Management    Social Work Follow Up Note  09/02/2019 Name: Pamela Key MRN: [id number] DOB: 09/12/1940  Pamela Key is a 79 y.o. year old female who is a primary care patient of [name], MD. The CCM team was consulted for assistance with care coordination.   Review of patient status, including review of consultants reports, other relevant assessments, and collaboration with appropriate care team members and the patient's provider was performed as part of comprehensive patient evaluation and provision of chronic care management services.    SDOH (Social Determinants of Health) assessments performed: No    Outpatient Encounter Medications as of 09/02/2019  Medication Sig  . colchicine 0.6 MG tablet Take 1 tablet (0.6 mg total) by mouth daily. (Patient not taking: Reported on 07/23/2019)  . lisinopril (ZESTRIL) 20 MG tablet Take 1 tablet (20 mg total) by mouth daily.  . metFORMIN (GLUCOPHAGE) 500 MG tablet Take 1 tablet (500 mg total) by mouth 2 (two) times daily. (Patient not taking: Reported on 07/23/2019)  . Multiple Vitamin (MULTIVITAMIN WITH MINERALS) TABS tablet Take 1 tablet by mouth daily with breakfast.  . pravastatin (PRAVACHOL) 40 MG tablet Take 1 tablet by mouth Monday - Friday   No facility-administered encounter medications on file as of 09/02/2019.     Goals Addressed            This Visit's Progress   . Collaborate with RN CAse Manager to perform appropriate assessments to determine care management and care coordination needs       Current Barriers:  [name] Knowledge barriers related to the independent self-health management of chronic conditions including DM and HTN . Limited social support . Lacks knowledge of community resources to assist with SDOH needs  Social Work Clinical Goal(s):  [name] Over the next 30 days the patients will work with RN Case Manager to establish an individualized plan of care related to the management of patients  identified chronic conditions . 07/30/2019- Over the next 45 days the patient will work with SW to address SDOH needs by identifying resources to assist with transportation and food insecurity  CCM SW Interventions: Completed 09/02/19 . Successful outbound call placed to the patient to confirm placement on the mobile meals waiting list . Provided education on the program and how it works . Discussed patient has yet to be contacted by SCAT regarding recently submitted application . Determined the patient has upcomming neurology appointment scheduled for 09/21/19 without a plan for transportation . Advised the patient SW would outreach [employer] with the SCAT eligibility office regarding status of application . Collaboration with [name] requesting follow up on application status . Scheduled follow up call to the patient for 09/04/19 to assist with transportation needs  Patient Self Care Activities:  . Does not adhere to provider recommendations re: home monitoring of fasting CBG readings and/or daily BP checks . Patient verbalizes understanding of plan to work with SW to address SDOH needs  Please see past updates related to this goal by clicking on the "Past Updates" button in the selected goal          Follow Up Plan: Appointment scheduled for SW follow up with client by phone on: 09/04/19   [name], BSW, CDP Social Worker, Certified Dementia Practitioner [facility] Care Management [phone]  Total time spent performing care coordination and/or care management activities with the patient by phone or face to face = 13 minutes.

## 2019-09-03 NOTE — Patient Instructions (Signed)
Social Worker Visit Information  Goals we discussed today:  Goals Addressed            This Visit's Progress   . Collaborate with RN CAse Manager to perform appropriate assessments to determine care management and care coordination needs       Current Barriers:  [name] Knowledge barriers related to the independent self-health management of chronic conditions including DM and HTN . Limited social support . Lacks knowledge of community resources to assist with SDOH needs  Social Work Clinical Goal(s):  [name] Over the next 30 days the patients will work with RN Case Manager to establish an individualized plan of care related to the management of patients identified chronic conditions . 07/30/2019- Over the next 45 days the patient will work with SW to address SDOH needs by identifying resources to assist with transportation and food insecurity  CCM SW Interventions: Completed 09/02/19 . Successful outbound call placed to the patient to confirm placement on the mobile meals waiting list . Provided education on the program and how it works . Discussed patient has yet to be contacted by SCAT regarding recently submitted application . Determined the patient has upcomming neurology appointment scheduled for 09/21/19 without a plan for transportation . Advised the patient SW would outreach [employer] with the SCAT eligibility office regarding status of application . Collaboration with [name] requesting follow up on application status . Scheduled follow up call to the patient for 09/04/19 to assist with transportation needs  Patient Self Care Activities:  . Does not adhere to provider recommendations re: home monitoring of fasting CBG readings and/or daily BP checks . Patient verbalizes understanding of plan to work with SW to address SDOH needs  Please see past updates related to this goal by clicking on the "Past Updates" button in the selected goal          Materials Provided: Verbal  education about community resources provided by phone  Follow Up Plan: Appointment scheduled for SW follow up with client by phone on: 09/04/19   [name], BSW, CDP Social Worker, Certified Dementia Practitioner [facility] Care Management [phone]

## 2019-09-04 ENCOUNTER — Ambulatory Visit: Payer: Self-pay

## 2019-09-04 ENCOUNTER — Telehealth: Payer: Self-pay

## 2019-09-04 DIAGNOSIS — E1165 Type 2 diabetes mellitus with hyperglycemia: Secondary | ICD-10-CM

## 2019-09-04 DIAGNOSIS — I10 Essential (primary) hypertension: Secondary | ICD-10-CM

## 2019-09-04 NOTE — Chronic Care Management (AMB) (Signed)
  Chronic Care Management   Outreach Note  09/04/2019 Name: Pamela Key MRN: [id number] DOB: [birth year]  Referred by: [name], MD Reason for referral : Care Coordination   SW received communication from [place] with SCAT indicating the patient was approved for service. SW placed an unsuccessful outbound call to the patient to assist with care coordination needs. Unfortunately, the patients voice mailbox is full which prohibited SW from leaving a voice message.  Follow Up Plan: The care management team will reach out to the patient again over the next 5 days.   [name], BSW, CDP Social Worker, Certified Dementia Practitioner [facility] Care Management [phone]

## 2019-09-07 ENCOUNTER — Ambulatory Visit: Payer: Self-pay

## 2019-09-07 DIAGNOSIS — I10 Essential (primary) hypertension: Secondary | ICD-10-CM

## 2019-09-07 DIAGNOSIS — E1165 Type 2 diabetes mellitus with hyperglycemia: Secondary | ICD-10-CM

## 2019-09-07 DIAGNOSIS — R413 Other amnesia: Secondary | ICD-10-CM

## 2019-09-07 NOTE — Chronic Care Management (AMB) (Signed)
  Chronic Care Management    Social Work Follow Up Note  09/07/2019 Name: Pamela Key MRN: [id number] DOB: September 05, 1940  Pamela Key is a 79 y.o. year old female who is a primary care patient of [name], MD. The CCM team was consulted for assistance with care coordination.   Review of patient status, including review of consultants reports, other relevant assessments, and collaboration with appropriate care team members and the patient's provider was performed as part of comprehensive patient evaluation and provision of chronic care management services.    SW placed a successful outbound call to the patient to inform of approval to access SCAT services for transportation needs. The patient reports she has "lost $800" and could not complete today's call. SW advised the patient SW would outreach over the next two weeks to assist with arranging transportation to upcomming neurology appointment.    Outpatient Encounter Medications as of 09/07/2019  Medication Sig  . colchicine 0.6 MG tablet Take 1 tablet (0.6 mg total) by mouth daily. (Patient not taking: Reported on 07/23/2019)  . lisinopril (ZESTRIL) 20 MG tablet Take 1 tablet (20 mg total) by mouth daily.  . metFORMIN (GLUCOPHAGE) 500 MG tablet Take 1 tablet (500 mg total) by mouth 2 (two) times daily. (Patient not taking: Reported on 07/23/2019)  . Multiple Vitamin (MULTIVITAMIN WITH MINERALS) TABS tablet Take 1 tablet by mouth daily with breakfast.  . pravastatin (PRAVACHOL) 40 MG tablet Take 1 tablet by mouth Monday - Friday   No facility-administered encounter medications on file as of 09/07/2019.      Follow Up Plan: SW will follow up with patient by phone over the next two weeks.   [name], BSW, CDP Social Worker, Certified Dementia Practitioner [facility] Care Management [phone]

## 2019-09-14 ENCOUNTER — Telehealth: Payer: Self-pay

## 2019-09-14 ENCOUNTER — Other Ambulatory Visit: Payer: Self-pay

## 2019-09-14 ENCOUNTER — Ambulatory Visit: Payer: Self-pay

## 2019-09-14 DIAGNOSIS — E1165 Type 2 diabetes mellitus with hyperglycemia: Secondary | ICD-10-CM

## 2019-09-14 DIAGNOSIS — I10 Essential (primary) hypertension: Secondary | ICD-10-CM

## 2019-09-14 DIAGNOSIS — N189 Chronic kidney disease, unspecified: Secondary | ICD-10-CM

## 2019-09-15 NOTE — Chronic Care Management (AMB) (Signed)
  Chronic Care Management   Outreach Note  09/15/2019 Name: Pamela Key MRN: [id number] DOB: Sep 27, 1940  Referred by: [name], MD Reason for referral : Chronic Care Management (RQ #3 Initial RN Call - HTN, DM, CKD, memory loss )   Third unsuccessful telephone outreach was attempted today. The patient was referred to the case management team for assistance with care management and care coordination. The patient's primary care provider has been notified of our unsuccessful attempts to make or maintain contact with the patient. The care management team is pleased to engage with this patient at any time in the future should he/she be interested in assistance from the care management team.   Follow Up Plan: The care management team is available to follow up with the patient after provider conversation with the patient regarding recommendation for care management engagement and subsequent re-referral to the care management team.   Delsa Sale, RN, BSN, CCM Care Management Coordinator [facility] Care Management/[facility]  Direct Phone: [phone]

## 2019-09-16 ENCOUNTER — Other Ambulatory Visit: Payer: Self-pay

## 2019-09-16 ENCOUNTER — Ambulatory Visit: Payer: Self-pay

## 2019-09-16 DIAGNOSIS — E1165 Type 2 diabetes mellitus with hyperglycemia: Secondary | ICD-10-CM

## 2019-09-16 DIAGNOSIS — I10 Essential (primary) hypertension: Secondary | ICD-10-CM

## 2019-09-16 MED ORDER — PRAVASTATIN SODIUM 40 MG PO TABS: ORAL_TABLET | ORAL | 1 refills | Status: DC

## 2019-09-16 MED ORDER — METFORMIN HCL 500 MG PO TABS: 500.0000 mg | ORAL_TABLET | Freq: Two times a day (BID) | ORAL | 1 refills | Status: DC

## 2019-09-16 MED ORDER — LISINOPRIL 20 MG PO TABS: 20.0000 mg | ORAL_TABLET | Freq: Every day | ORAL | 1 refills | Status: DC

## 2019-09-16 NOTE — Patient Instructions (Signed)
Social Worker Visit Information  Goals we discussed today:  Goals Addressed            This Visit's Progress     Patient Stated   . COMPLETED: "my bathroom has mold in it and the floor is weak" (pt-stated)       Current Barriers:  . Financial constraints related to ability to perform home repair . Lacks knowledge of community resource: [employer]   Clinical Social Work Clinical Goal(s):  [name] Over the next 60 days the patient will work with SW to explore community resources to assist with home repair needs. Goal not met . New 07/10/19- Over the next 90 days the patient will work with SW to identify resources to assist with home repair.  CCM SW Interventions: Completed 09/16/2019 . Goal closure as the patient has been provided with information to access resources for home repair needs  Patient Self Care Activities:  . Performs ADL's independently . Performs IADL's independently . Unable to independently perform home modification needs  Please see past updates related to this goal by clicking on the "Past Updates" button in the selected goal        Other   . COMPLETED: Collaborate with RN Tourist information centre manager to perform appropriate assessments to determine care management and care coordination needs       Current Barriers:  [name] Knowledge barriers related to the independent self-health management of chronic conditions including DM and HTN . Limited social support . Lacks knowledge of community resources to assist with SDOH needs  Social Work Clinical Goal(s):  [name] Over the next 30 days the patients will work with RN Case Manager to establish an individualized plan of care related to the management of patients identified chronic conditions . 07/30/2019- Over the next 45 days the patient will work with SW to address SDOH needs by identifying resources to assist with transportation and food insecurity  CCM SW Interventions: Completed 09/16/2019 . Successful outbound call  placed to the patient to provide education on how to access SCAT transportation benefit . Discussed the patients plans to attend an appointment Friday 5/7 at her primary care providers office . Advised the patient SW would have information on the SCAT program for her to pick up during her appointment . Collaboration with provider office to request brochure be handed to the patient during visit . Collaboration with RN Care Manager to communicate plan to close patient to SW as resources have been provided  Patient Self Care Activities:  . Does not adhere to provider recommendations re: home monitoring of fasting CBG readings and/or daily BP checks . Patient verbalizes understanding of plan to work with SW to address SDOH needs  Please see past updates related to this goal by clicking on the "Past Updates" button in the selected goal          Materials Provided: Verbal education about transportation resources provided by phone  Follow Up Plan: No follow up planned at this time. Please contact me with future resource needs.   [name], BSW, CDP Social Worker, Certified Dementia Practitioner [place] [facility] [phone]

## 2019-09-16 NOTE — Chronic Care Management (AMB) (Signed)
Chronic Care Management    Social Work Follow Up Note  09/16/2019 Name: Pamela Key MRN: [id number] DOB: 01-21-1941  Pamela Key is a 79 y.o. year old female who is a primary care patient of Glendale Chard, MD. The CCM team was consulted for assistance with care coordination.   Review of patient status, including review of consultants reports, other relevant assessments, and collaboration with appropriate care team members and the patient's provider was performed as part of comprehensive patient evaluation and provision of chronic care management services.    SDOH (Social Determinants of Health) assessments performed: No    Outpatient Encounter Medications as of 09/16/2019  Medication Sig  . colchicine 0.6 MG tablet Take 1 tablet (0.6 mg total) by mouth daily. (Patient not taking: Reported on 07/23/2019)  . lisinopril (ZESTRIL) 20 MG tablet Take 1 tablet (20 mg total) by mouth daily.  . metFORMIN (GLUCOPHAGE) 500 MG tablet Take 1 tablet (500 mg total) by mouth 2 (two) times daily. (Patient not taking: Reported on 07/23/2019)  . Multiple Vitamin (MULTIVITAMIN WITH MINERALS) TABS tablet Take 1 tablet by mouth daily with breakfast.  . pravastatin (PRAVACHOL) 40 MG tablet Take 1 tablet by mouth Monday - Friday   No facility-administered encounter medications on file as of 09/16/2019.     Goals Addressed            This Visit's Progress     Patient Stated   . COMPLETED: "my bathroom has mold in it and the floor is weak" (pt-stated)       Current Barriers:  . Financial constraints related to ability to perform home repair . Lacks knowledge of community resource: [employer]   Clinical Social Work Clinical Goal(s):  [name] Over the next 60 days the patient will work with SW to explore community resources to assist with home repair needs. Goal not met . New 07/10/19- Over the next 90 days the patient will work with SW to identify resources to assist with home  repair.  CCM SW Interventions: Completed 09/16/2019 . Goal closure as the patient has been provided with information to access resources for home repair needs  Patient Self Care Activities:  . Performs ADL's independently . Performs IADL's independently . Unable to independently perform home modification needs  Please see past updates related to this goal by clicking on the "Past Updates" button in the selected goal        Other   . COMPLETED: Collaborate with RN Tourist information centre manager to perform appropriate assessments to determine care management and care coordination needs       Current Barriers:  [name] Knowledge barriers related to the independent self-health management of chronic conditions including DM and HTN . Limited social support . Lacks knowledge of community resources to assist with SDOH needs  Social Work Clinical Goal(s):  [name] Over the next 30 days the patients will work with RN Case Manager to establish an individualized plan of care related to the management of patients identified chronic conditions . 07/30/2019- Over the next 45 days the patient will work with SW to address SDOH needs by identifying resources to assist with transportation and food insecurity  CCM SW Interventions: Completed 09/16/2019 . Successful outbound call placed to the patient to provide education on how to access SCAT transportation benefit . Discussed the patients plans to attend an appointment Friday 5/7 at her primary care providers office . Advised the patient SW would have information on the SCAT program for her to  pick up during her appointment . Collaboration with provider office to request brochure be handed to the patient during visit . Collaboration with RN Care Manager to communicate plan to close patient to SW as resources have been provided  Patient Self Care Activities:  . Does not adhere to provider recommendations re: home monitoring of fasting CBG readings and/or daily BP checks . Patient  verbalizes understanding of plan to work with SW to address SDOH needs  Please see past updates related to this goal by clicking on the "Past Updates" button in the selected goal          Follow Up Plan: No SW follow up planned at this time. SW is available to work with patient with future care coordination needs.   [name], BSW, CDP Social Worker, Certified Dementia Practitioner [place] [facility] [place] [facility] [phone]  Total time spent performing care coordination and/or care management activities with the patient by phone or face to face = 10 minutes.

## 2019-09-18 ENCOUNTER — Ambulatory Visit: Payer: Self-pay | Attending: Internal Medicine

## 2019-09-18 ENCOUNTER — Ambulatory Visit: Payer: Self-pay

## 2019-09-18 DIAGNOSIS — Z23 Encounter for immunization: Secondary | ICD-10-CM

## 2019-09-18 NOTE — Progress Notes (Signed)
   Covid-19 Vaccination Clinic  Name:  FIONNA MERRIOTT    MRN: [id number] DOB: [birth year]  09/18/2019  Ms. Parcher was observed post Covid-19 immunization for 15 minutes without incident. She was provided with Vaccine Information Sheet and instruction to access the V-Safe system.   Ms. Reinertsen was instructed to call 911 with any severe reactions post vaccine: [name] Difficulty breathing  . Swelling of face and throat  . A fast heartbeat  . A bad rash all over body  . Dizziness and weakness   Immunizations Administered    Name Date Dose VIS Date Route   Moderna COVID-19 Vaccine 09/18/2019 10:02 AM 0.5 mL 04/2019 Intramuscular   Manufacturer: Moderna   Lot: [id number]   NDC: 32761-470-92

## 2019-09-21 ENCOUNTER — Telehealth: Payer: Self-pay

## 2019-09-21 ENCOUNTER — Encounter: Payer: Self-pay | Admitting: Neurology

## 2019-09-21 ENCOUNTER — Ambulatory Visit: Payer: Medicare HMO | Admitting: Neurology

## 2019-09-21 NOTE — Telephone Encounter (Signed)
Pt no showed new pt appt with Dr. Frances Furbish on 09/21/2019.  This is her second no show will fwd to Angie for dismissal.

## 2019-09-22 ENCOUNTER — Telehealth: Payer: Self-pay

## 2019-09-22 ENCOUNTER — Ambulatory Visit: Payer: Self-pay

## 2019-09-22 ENCOUNTER — Other Ambulatory Visit: Payer: Self-pay

## 2019-09-22 DIAGNOSIS — E1165 Type 2 diabetes mellitus with hyperglycemia: Secondary | ICD-10-CM

## 2019-09-22 DIAGNOSIS — I10 Essential (primary) hypertension: Secondary | ICD-10-CM

## 2019-09-22 DIAGNOSIS — R413 Other amnesia: Secondary | ICD-10-CM

## 2019-09-22 DIAGNOSIS — N189 Chronic kidney disease, unspecified: Secondary | ICD-10-CM

## 2019-09-23 ENCOUNTER — Encounter: Payer: Self-pay | Admitting: Neurology

## 2019-09-23 NOTE — Chronic Care Management (AMB) (Signed)
  Chronic Care Management   Follow Up Note   09/23/2019 Name: Pamela Key MRN: [id number] DOB: 10-04-1940  Referred by: [name], MD Reason for referral : No chief complaint on file.   Pamela Key is a 79 y.o. year old female who is a primary care patient of [name], MD. The CCM team was consulted for assistance with chronic disease management and care coordination needs.    Review of patient status, including review of consultants reports, relevant laboratory and other test results, and collaboration with appropriate care team members and the patient's provider was performed as part of comprehensive patient evaluation and provision of chronic care management services.    Patient will be d/c from CCM due to not being able to engage patient for CCM collaboration.   Outpatient Encounter Medications as of 09/22/2019  Medication Sig  . colchicine 0.6 MG tablet Take 1 tablet (0.6 mg total) by mouth daily. (Patient not taking: Reported on 07/23/2019)  . lisinopril (ZESTRIL) 20 MG tablet Take 1 tablet (20 mg total) by mouth daily.  . metFORMIN (GLUCOPHAGE) 500 MG tablet Take 1 tablet (500 mg total) by mouth 2 (two) times daily.  . Multiple Vitamin (MULTIVITAMIN WITH MINERALS) TABS tablet Take 1 tablet by mouth daily with breakfast.  . pravastatin (PRAVACHOL) 40 MG tablet Take 1 tablet by mouth Monday - Friday   No facility-administered encounter medications on file as of 09/22/2019.     Goals Addressed    . COMPLETED: Assist with Chronic Care Management and Care Coordination needs       Current Barriers:  [name] Knowledge Barriers related to resources and support available to address needs related to Chronic disease management and Care Coordination needs  Case Manager Clinical Goal(s):  [name] Over the next 30 days, patient will work with the CCM team to address needs related to Chronic Care Management and Care Coordination needs  Interventions:  . Collaborated with BSW and  initiated plan of care to address needs related to Chronic Care Management  Patient Self Care Activities:  . Self administers medications as prescribed . Calls pharmacy for medication refills . Calls provider office for new concerns or questions  Initial goal documentation       Plan:   The care management team is available to follow up with the patient after provider conversation with the patient regarding recommendation for care management engagement and subsequent re-referral to the care management team.   Delsa Sale, RN, BSN, CCM Care Management Coordinator [facility] Care Management/[facility]  Direct Phone: [phone]

## 2019-10-05 ENCOUNTER — Telehealth: Payer: Self-pay

## 2019-10-05 NOTE — Telephone Encounter (Signed)
Returned call to patient. She is going to return call to reschedule her covid vaccine. She is going out of town to [place] for sons wedding

## 2019-10-06 ENCOUNTER — Other Ambulatory Visit: Payer: Self-pay

## 2019-10-06 ENCOUNTER — Encounter: Payer: Medicare HMO | Admitting: Nurse Practitioner

## 2019-10-06 VITALS — BP 126/82 | HR 64 | Temp 98.1°F | Ht 62.4 in | Wt 190.2 lb

## 2019-10-13 ENCOUNTER — Other Ambulatory Visit: Payer: Self-pay

## 2019-10-13 ENCOUNTER — Ambulatory Visit: Payer: Medicare HMO

## 2019-10-13 VITALS — BP 130/80 | HR 78 | Temp 98.1°F | Ht 62.4 in | Wt 190.0 lb

## 2019-10-13 DIAGNOSIS — Z139 Encounter for screening, unspecified: Secondary | ICD-10-CM

## 2019-10-13 NOTE — Progress Notes (Signed)
PT IS HERE TODAY FOR COVID TEST TO TRAVEL. PT IS GOING TO SONS WEDDING IN [place] ON Friday

## 2019-10-14 LAB — NOVEL CORONAVIRUS, NAA: SARS-CoV-2, NAA: NOT DETECTED

## 2019-10-14 LAB — SARS-COV-2, NAA 2 DAY TAT

## 2019-10-16 ENCOUNTER — Ambulatory Visit: Payer: BC Managed Care – PPO

## 2019-10-23 ENCOUNTER — Ambulatory Visit: Payer: Medicare HMO

## 2019-10-26 ENCOUNTER — Other Ambulatory Visit: Payer: Self-pay

## 2019-10-26 ENCOUNTER — Encounter: Payer: Self-pay | Admitting: Internal Medicine

## 2019-10-26 ENCOUNTER — Ambulatory Visit (INDEPENDENT_AMBULATORY_CARE_PROVIDER_SITE_OTHER): Payer: Medicare HMO | Admitting: Internal Medicine

## 2019-10-26 VITALS — BP 156/80 | HR 78 | Temp 98.2°F | Ht 62.4 in | Wt 188.2 lb

## 2019-10-26 DIAGNOSIS — E66811 Obesity, class 1: Secondary | ICD-10-CM

## 2019-10-26 DIAGNOSIS — M109 Gout, unspecified: Secondary | ICD-10-CM

## 2019-10-26 DIAGNOSIS — E6609 Other obesity due to excess calories: Secondary | ICD-10-CM

## 2019-10-26 DIAGNOSIS — N182 Chronic kidney disease, stage 2 (mild): Secondary | ICD-10-CM | POA: Diagnosis not present

## 2019-10-26 DIAGNOSIS — I129 Hypertensive chronic kidney disease with stage 1 through stage 4 chronic kidney disease, or unspecified chronic kidney disease: Secondary | ICD-10-CM

## 2019-10-26 DIAGNOSIS — E1122 Type 2 diabetes mellitus with diabetic chronic kidney disease: Secondary | ICD-10-CM

## 2019-10-26 DIAGNOSIS — M21619 Bunion of unspecified foot: Secondary | ICD-10-CM

## 2019-10-26 DIAGNOSIS — Z6833 Body mass index (BMI) 33.0-33.9, adult: Secondary | ICD-10-CM

## 2019-10-26 MED ORDER — COLCHICINE 0.6 MG PO TABS: 0.6000 mg | ORAL_TABLET | Freq: Every day | ORAL | 2 refills | Status: DC

## 2019-10-26 NOTE — Patient Instructions (Signed)
Please bring all meds to your next apointment.   Gout  Gout is painful swelling of your joints. Gout is a type of arthritis. It is caused by having too much uric acid in your body. Uric acid is a chemical that is made when your body breaks down substances called purines. If your body has too much uric acid, sharp crystals can form and build up in your joints. This causes pain and swelling. Gout attacks can happen quickly and be very painful (acute gout). Over time, the attacks can affect more joints and happen more often (chronic gout). What are the causes?  Too much uric acid in your blood. This can happen because: ? Your kidneys do not remove enough uric acid from your blood. ? Your body makes too much uric acid. ? You eat too many foods that are high in purines. These foods include organ meats, some seafood, and beer.  Trauma or stress. What increases the risk?  Having a family history of gout.  Being female and middle-aged.  Being female and having gone through menopause.  Being very overweight (obese).  Drinking alcohol, especially beer.  Not having enough water in the body (being dehydrated).  Losing weight too quickly.  Having an organ transplant.  Having lead poisoning.  Taking certain medicines.  Having kidney disease.  Having a skin condition called psoriasis. What are the signs or symptoms? An attack of acute gout usually happens in just one joint. The most common place is the big toe. Attacks often start at night. Other joints that may be affected include joints of the feet, ankle, knee, fingers, wrist, or elbow. Symptoms of an attack may include:  Very bad pain.  Warmth.  Swelling.  Stiffness.  Shiny, red, or purple skin.  Tenderness. The affected joint may be very painful to touch.  Chills and fever. Chronic gout may cause symptoms more often. More joints may be involved. You may also have white or yellow lumps (tophi) on your hands or feet or in  other areas near your joints. How is this treated?  Treatment for this condition has two phases: treating an acute attack and preventing future attacks.  Acute gout treatment may include: ? NSAIDs. ? Steroids. These are taken by mouth or injected into a joint. ? Colchicine. This medicine relieves pain and swelling. It can be given by mouth or through an IV tube.  Preventive treatment may include: ? Taking small doses of NSAIDs or colchicine daily. ? Using a medicine that reduces uric acid levels in your blood. ? Making changes to your diet. You may need to see a food expert (dietitian) about what to eat and drink to prevent gout. Follow these instructions at home: During a gout attack   If told, put ice on the painful area: ? Put ice in a plastic bag. ? Place a towel between your skin and the bag. ? Leave the ice on for 20 minutes, 2-3 times a day.  Raise (elevate) the painful joint above the level of your heart as often as you can.  Rest the joint as much as possible. If the joint is in your leg, you may be given crutches.  Follow instructions from your doctor about what you cannot eat or drink. Avoiding future gout attacks  Eat a low-purine diet. Avoid foods and drinks such as: ? Liver. ? Kidney. ? Anchovies. ? Asparagus. ? Herring. ? Mushrooms. ? Mussels. ? Beer.  Stay at a healthy weight. If you want to  lose weight, talk with your doctor. Do not lose weight too fast.  Start or continue an exercise plan as told by your doctor. Eating and drinking  Drink enough fluids to keep your pee (urine) pale yellow.  If you drink alcohol: ? Limit how much you use to:  0-1 drink a day for women.  0-2 drinks a day for men. ? Be aware of how much alcohol is in your drink. In the [place] one drink equals one 12 oz bottle of beer (355 mL), one 5 oz glass of wine (148 mL), or one 1 oz glass of hard liquor (44 mL). General instructions  Take over-the-counter and prescription  medicines only as told by your doctor.  Do not drive or use heavy machinery while taking prescription pain medicine.  Return to your normal activities as told by your doctor. Ask your doctor what activities are safe for you.  Keep all follow-up visits as told by your doctor. This is important. Contact a doctor if:  You have another gout attack.  You still have symptoms of a gout attack after 10 days of treatment.  You have problems (side effects) because of your medicines.  You have chills or a fever.  You have burning pain when you pee (urinate).  You have pain in your lower back or belly. Get help right away if:  You have very bad pain.  Your pain cannot be controlled.  You cannot pee. Summary  Gout is painful swelling of the joints.  The most common site of pain is the big toe, but it can affect other joints.  Medicines and avoiding some foods can help to prevent and treat gout attacks. This information is not intended to replace advice given to you by your health care provider. Make sure you discuss any questions you have with your health care provider. Document Revised: 11/20/2017 Document Reviewed: 11/20/2017 Elsevier Patient Education  [place].

## 2019-10-26 NOTE — Progress Notes (Addendum)
This visit occurred during the SARS-CoV-2 public health emergency.  Safety protocols were in place, including screening questions prior to the visit, additional usage of staff PPE, and extensive cleaning of exam room while observing appropriate contact time as indicated for disinfecting solutions.  Subjective:     Patient ID: Pamela Key , female    DOB: 04/28/1941 , 79 y.o.   MRN: [id number]   Chief Complaint  Patient presents with  . Diabetes  . Hypertension    HPI  She presents today for diabetes/BP check. She reports compliance with meds.   Diabetes She presents for her follow-up diabetic visit. She has type 2 diabetes mellitus. Her disease course has been stable. There are no hypoglycemic associated symptoms. There are no diabetic associated symptoms. Pertinent negatives for diabetes include no blurred vision and no chest pain. There are no hypoglycemic complications. There are no diabetic complications. Risk factors for coronary artery disease include diabetes mellitus, dyslipidemia and hypertension. She is following a generally healthy diet. She participates in exercise intermittently. Her home blood glucose trend is fluctuating minimally. Her breakfast blood glucose is taken between 8-9 am. Her breakfast blood glucose range is generally 90-110 mg/dl. An ACE inhibitor/angiotensin II receptor blocker is being taken. She does not see a podiatrist.Eye exam is not current.  Hypertension This is a chronic problem. The current episode started more than 1 year ago. The problem has been gradually improving since onset. The problem is controlled. Pertinent negatives include no blurred vision or chest pain. Risk factors for coronary artery disease include diabetes mellitus, dyslipidemia, post-menopausal state, obesity and sedentary lifestyle. Past treatments include ACE inhibitors. The current treatment provides moderate improvement. Compliance problems include exercise.  Hypertensive  end-organ damage includes kidney disease.     Past Medical History:  Diagnosis Date  . Diabetes mellitus without complication (HCC)   . Gout   . High cholesterol   . Hypertension      Family History  Problem Relation Age of Onset  . Diabetes Mother   . Hypertension Mother   . Cancer Father   . Hypertension Father      Current Outpatient Medications:  .  lisinopril (ZESTRIL) 20 MG tablet, Take 1 tablet (20 mg total) by mouth daily., Disp: 90 tablet, Rfl: 1 .  Multiple Vitamin (MULTIVITAMIN WITH MINERALS) TABS tablet, Take 1 tablet by mouth daily with breakfast., Disp: , Rfl:  .  pravastatin (PRAVACHOL) 40 MG tablet, Take 1 tablet by mouth Monday - Friday, Disp: 75 tablet, Rfl: 1 .  colchicine 0.6 MG tablet, Take 1 tablet (0.6 mg total) by mouth daily. (Patient not taking: Reported on 10/26/2019), Disp: 30 tablet, Rfl: 2 .  metFORMIN (GLUCOPHAGE) 500 MG tablet, Take 1 tablet (500 mg total) by mouth 2 (two) times daily. (Patient not taking: Reported on 10/06/2019), Disp: 180 tablet, Rfl: 1   No Known Allergies   Review of Systems  Constitutional: Negative.   Eyes: Negative for blurred vision.  Respiratory: Negative.   Cardiovascular: Negative.  Negative for chest pain.  Gastrointestinal: Negative.   Musculoskeletal: Positive for arthralgias.       She c/l left foot pain. There is pain with ambulation. Pain started yesterday.   Neurological: Negative.   Psychiatric/Behavioral: Negative.      Today's Vitals   10/26/19 1506  BP: (!) 156/80  Pulse: 78  Temp: 98.2 F (36.8 C)  TempSrc: Oral  Weight: 188 lb 3.2 oz (85.4 kg)  Height: 5' 2.4" (1.585 m)  PainSc: 5     PainLoc: Foot   Body mass index is 33.98 kg/m.   Objective:  Physical Exam Vitals and nursing note reviewed.  Constitutional:      Appearance: Normal appearance. She is obese.  HENT:     Head: Normocephalic and atraumatic.  Cardiovascular:     Rate and Rhythm: Normal rate and regular rhythm.     Heart  sounds: Normal heart sounds.  Pulmonary:     Effort: Pulmonary effort is normal.     Breath sounds: Normal breath sounds.  Musculoskeletal:     Comments: Bilateral bunions. Left bunion is erythematous, sl. Swollen and tender to palpation. Area surrounding bunion is erythematous as well. She had pain putting on socks/shoes.   Skin:    General: Skin is warm.  Neurological:     General: No focal deficit present.     Mental Status: She is alert.  Psychiatric:        Mood and Affect: Mood normal.        Behavior: Behavior normal.         Assessment And Plan:     1. Type 2 diabetes mellitus with stage 2 chronic kidney disease, without long-term current use of insulin (HCC)  Chronic, I will check labs as listed below. I will adjust meds as needed.  Importance of regular exercise was discussed with the patient.  She will continue to check her sugars twice daily. She has been advised to check BS upon awakening and before dinner.  All questions were answered to her satisfaction.   - BMP8+EGFR - Hemoglobin A1c - Hepatitis C antibody  2. Hypertensive nephropathy  Chronic, uncontrolled. She reports her BP is high due to foot pain. She will continue with current meds for now. She agrees to rto in four to six weeks for re-evaluation. If elevated at that time, she agrees to take another medication as needed. I will check renal function today.   3. Stage 2 chronic kidney disease  Chronic, yet stable. She is encouraged to stay well hydrated.   4. Bunion of great toe  Chronic. She does not wish to have any surgical intervention at this time.   5. Acute gout involving toe of left foot, unspecified cause  I will resume colchicine 0.56m daily. She unfortunately has stopped her allopurinol. She will f/u in 4 weeks and I will check uric acid level at that time.   6. Class 1 obesity due to excess calories with serious comorbidity and body mass index (BMI) of 33.0 to 33.9 in adult  She is  encouraged to strive for BMI less than 30 to decrease cardiac risk. Advised to increase her daily activity - aiming for 150 minutes per week. She is advised to decrease her intake of juices, sodas and to avoid white breads, rice and pasta.   RMaximino Greenland MD    THE PATIENT IS ENCOURAGED TO PRACTICE SOCIAL DISTANCING DUE TO THE COVID-19 PANDEMIC.

## 2019-10-27 LAB — HEMOGLOBIN A1C
Est. average glucose Bld gHb Est-mCnc: 128 mg/dL
Hgb A1c MFr Bld: 6.1 % — ABNORMAL HIGH (ref 4.8–5.6)

## 2019-10-27 LAB — BMP8+EGFR
BUN/Creatinine Ratio: 15 (ref 12–28)
BUN: 11 mg/dL (ref 8–27)
CO2: 23 mmol/L (ref 20–29)
Calcium: 9.3 mg/dL (ref 8.7–10.3)
Chloride: 105 mmol/L (ref 96–106)
Creatinine, Ser: 0.74 mg/dL (ref 0.57–1.00)
GFR calc Af Amer: 91 mL/min/{1.73_m2} (ref >59)
GFR calc non Af Amer: 79 mL/min/{1.73_m2} (ref >59)
Glucose: 89 mg/dL (ref 65–99)
Potassium: 3.9 mmol/L (ref 3.5–5.2)
Sodium: 144 mmol/L (ref 134–144)

## 2019-10-27 LAB — HEPATITIS C ANTIBODY: Hep C Virus Ab: <0.1 s/co ratio (ref 0.0–0.9)

## 2019-11-12 ENCOUNTER — Encounter: Payer: Self-pay | Admitting: Internal Medicine

## 2019-11-12 LAB — HM DIABETES EYE EXAM

## 2019-11-18 ENCOUNTER — Telehealth: Payer: Self-pay

## 2019-11-18 NOTE — Telephone Encounter (Signed)
I called the pt because Dr Allyne Gee got a fax from [facility] and waned to know if the pt requested any medications from them.

## 2019-11-19 ENCOUNTER — Ambulatory Visit: Payer: Self-pay

## 2019-11-19 ENCOUNTER — Encounter: Payer: Self-pay | Admitting: Internal Medicine

## 2019-11-19 DIAGNOSIS — N182 Chronic kidney disease, stage 2 (mild): Secondary | ICD-10-CM

## 2019-11-19 DIAGNOSIS — I129 Hypertensive chronic kidney disease with stage 1 through stage 4 chronic kidney disease, or unspecified chronic kidney disease: Secondary | ICD-10-CM

## 2019-11-19 DIAGNOSIS — E1122 Type 2 diabetes mellitus with diabetic chronic kidney disease: Secondary | ICD-10-CM

## 2019-11-19 NOTE — Chronic Care Management (AMB) (Signed)
  Care Management   Outreach Note  11/19/2019 Name: Pamela Key MRN: [id number] DOB: 03-12-1941  Referred by: [name], MD Reason for referral : Care Coordination   SW placed an outbound call to the patients son, Pamela Key who is on [place], in response to a voice message received requesting SW assistance. Mr. Pamela Key reports concern over his mothers ability to care for herself. He reports she has beginning stages of dementia and is not administering medications appropriately. Mr. Pamela Key lives in [place] but has a niece who is local to [place]. He reports his niece stopped by to check on the patient to find the patients home without electricity due to not paying to bill and infested with bed bugs and roaches. Mr. Key requests SW assistance to report living conditions to DSS to have someone visit the home to determine if the patient is able to care for herself.  SW advised Mr. Key, SW would place a referral to APS but that did not guarantee the patient would meet criteria for an in home visit.  SW placed successful referral to APS to report concerns of patients son. SW requested call back to SW directly once case is reviewed to disclose if the patient would receive a home visit and the timeframe in which it would be expected.   SW placed a successful outbound call to the patients son to advise report had been placed. Discussed opportunity to also call non-emergency contact number to [facility] Department if he feels the patient is in immediate danger for a welfare check to be completed. Mr. Pamela Key stated he felt he could wait to see what APS decision would be.  Follow Up Plan: Collaboration with PCP and RN Care Manager to inform of report placed to APS. SW to continue to follow.  [name], BSW, CDP Social Worker, Certified Dementia Practitioner [facility] Care Management [phone]

## 2019-11-20 ENCOUNTER — Ambulatory Visit: Payer: Medicare Other

## 2019-11-20 DIAGNOSIS — N182 Chronic kidney disease, stage 2 (mild): Secondary | ICD-10-CM

## 2019-11-20 DIAGNOSIS — E0800 Diabetes mellitus due to underlying condition with hyperosmolarity without nonketotic hyperglycemic-hyperosmolar coma (NKHHC): Secondary | ICD-10-CM | POA: Diagnosis not present

## 2019-11-20 DIAGNOSIS — E1122 Type 2 diabetes mellitus with diabetic chronic kidney disease: Secondary | ICD-10-CM

## 2019-11-20 DIAGNOSIS — I129 Hypertensive chronic kidney disease with stage 1 through stage 4 chronic kidney disease, or unspecified chronic kidney disease: Secondary | ICD-10-CM

## 2019-11-20 DIAGNOSIS — I10 Essential (primary) hypertension: Secondary | ICD-10-CM | POA: Diagnosis not present

## 2019-11-20 NOTE — Chronic Care Management (AMB) (Signed)
  Care Management   Outreach Note  11/20/2019 Name: Pamela Key MRN: [id number] DOB: 1940/11/09  Referred by: [name], MD Reason for referral : Care Coordination   Successful outbound call placed to the patients son to inform SW has yet to receive report from APS whether the patient would qualify to receive an in home visit or not. [name] reports he has not been contacted either.  Discussed long-term goal for patient would be to either have in home aide services to assist with med management and ADl/iADL needs or placement pending outcome of DSS assessment. SW advised Mr. [place], SW would contact over the next week to determine outcome of APS referral as well as to assist with care coordination resources as needed.  Follow Up Plan: SW will contact the patients son over the next week.  [name], BSW, CDP Social Worker, Certified Dementia Practitioner [facility] Care Management [phone]

## 2019-11-23 ENCOUNTER — Ambulatory Visit: Payer: Self-pay

## 2019-11-23 DIAGNOSIS — I129 Hypertensive chronic kidney disease with stage 1 through stage 4 chronic kidney disease, or unspecified chronic kidney disease: Secondary | ICD-10-CM

## 2019-11-23 DIAGNOSIS — N182 Chronic kidney disease, stage 2 (mild): Secondary | ICD-10-CM

## 2019-11-23 DIAGNOSIS — E1122 Type 2 diabetes mellitus with diabetic chronic kidney disease: Secondary | ICD-10-CM

## 2019-11-23 NOTE — Chronic Care Management (AMB) (Signed)
  Chronic Care Management   Outreach Note  11/23/2019 Name: Pamela Key MRN: [id number] DOB: 18-Oct-1940  Referred by: [name], MD Reason for referral : Care Coordination   SW received in bound call from [facility] DSS to inform SW the patient APS report was screened and accepted for a SW to make a home visit. SW spoke with the patient son to inform of outcome of report.   Follow Up Plan: SW to follow up over the next week.  [name], BSW, CDP Social Worker, Certified Dementia Practitioner [facility] Care Management [phone]

## 2019-11-26 ENCOUNTER — Ambulatory Visit: Payer: Self-pay

## 2019-11-26 ENCOUNTER — Telehealth: Payer: Medicare Other

## 2019-11-26 DIAGNOSIS — I129 Hypertensive chronic kidney disease with stage 1 through stage 4 chronic kidney disease, or unspecified chronic kidney disease: Secondary | ICD-10-CM

## 2019-11-26 DIAGNOSIS — N182 Chronic kidney disease, stage 2 (mild): Secondary | ICD-10-CM

## 2019-11-26 DIAGNOSIS — E1122 Type 2 diabetes mellitus with diabetic chronic kidney disease: Secondary | ICD-10-CM

## 2019-11-26 NOTE — Chronic Care Management (AMB) (Signed)
  Care Management   Outreach Note  11/26/2019 Name: Pamela Key MRN: [id number] DOB: April 01, 1941  Referred by: [name], MD Reason for referral : Care Coordination   SW placed an unsuccessful outbound call to the patient to follow up on care coordination needs. SW unable to leave voice message due to the patients voice mailbox being full.  Follow Up Plan: No SW follow up planned at this time. SW has been unable to maintain contact with the patient for several months. The patient has been referred to APS as requested by the patients son. SW collaboration with PCP to request SW referral as needed following upcomming office visit scheduled for 8/19.  [name], BSW, CDP Social Worker, Certified Dementia Practitioner [facility] Care Management [phone]

## 2019-11-26 NOTE — Chronic Care Management (AMB) (Signed)
Erroneous encounter

## 2019-12-10 DIAGNOSIS — E119 Type 2 diabetes mellitus without complications: Secondary | ICD-10-CM | POA: Diagnosis not present

## 2019-12-10 LAB — HM DIABETES EYE EXAM

## 2019-12-11 ENCOUNTER — Ambulatory Visit: Payer: Medicare Other

## 2019-12-11 DIAGNOSIS — N182 Chronic kidney disease, stage 2 (mild): Secondary | ICD-10-CM

## 2019-12-11 DIAGNOSIS — I129 Hypertensive chronic kidney disease with stage 1 through stage 4 chronic kidney disease, or unspecified chronic kidney disease: Secondary | ICD-10-CM

## 2019-12-11 DIAGNOSIS — R413 Other amnesia: Secondary | ICD-10-CM

## 2019-12-11 DIAGNOSIS — E1122 Type 2 diabetes mellitus with diabetic chronic kidney disease: Secondary | ICD-10-CM

## 2019-12-11 NOTE — Chronic Care Management (AMB) (Signed)
  Chronic Care Management   Social Work Note  12/11/2019 Name: Pamela Key MRN: [id number] DOB: June 08, 1940  Pamela Key is a 79 y.o. year old female who sees Pamela Peng, MD for primary care and was previously enrolled in our embedded care management program for assistance with disease management and care coordination.  SW placed a successful outbound call to the patients son, [name], in response to a voice message received requesting a return call. [name] reports he is following up on APS referral outcome. SW advised Mr. Washing SW did not get a report from APS indicating outcome of home visit. SW further advised, this Clinical research associate has attempted to contact the patient without success several times. [name] understands the patient has an upcomming PCP appointment in August and that SW has requested PCP consult with SW as needed following that appointment.  [name] reports he plans to drive to [place] over the weekend to visit his mom and see what condition she is living in. SW advised Mr. [place] that if resource needs are identified, SW would be able to assist with placing referrals on behalf of the patient. SW also made it very clear that a local support person would need to be identified to check on the patient regularly and provide assistance with resource navigation as it is difficult to stay in contact with the patient. [name] understands that resources will be unable to help without being able to maintain patient contact.    Outpatient Encounter Medications as of 12/11/2019  Medication Sig  . colchicine 0.6 MG tablet Take 1 tablet (0.6 mg total) by mouth daily.  [name] lisinopril (ZESTRIL) 20 MG tablet Take 1 tablet (20 mg total) by mouth daily.  . metFORMIN (GLUCOPHAGE) 500 MG tablet Take 1 tablet (500 mg total) by mouth 2 (two) times daily. (Patient not taking: Reported on 10/06/2019)  . Multiple Vitamin (MULTIVITAMIN WITH MINERALS) TABS tablet Take  1 tablet by mouth daily with breakfast.  . pravastatin (PRAVACHOL) 40 MG tablet Take 1 tablet by mouth Monday - Friday   No facility-administered encounter medications on file as of 12/11/2019.    Follow Up Plan: No SW follow up planned at this time. SW is available to assist with future patient care coordination needs.  [name], BSW, CDP Social Worker, Certified Dementia Practitioner [facility] Care Management [phone]

## 2019-12-16 ENCOUNTER — Other Ambulatory Visit: Payer: Self-pay | Admitting: Internal Medicine

## 2019-12-21 ENCOUNTER — Encounter: Payer: Self-pay | Admitting: Internal Medicine

## 2019-12-22 ENCOUNTER — Telehealth: Payer: Medicare Other

## 2019-12-22 ENCOUNTER — Telehealth: Payer: Self-pay

## 2019-12-22 NOTE — Telephone Encounter (Signed)
  Chronic Care Management   Outreach Note  12/22/2019 Name: Pamela Key MRN: [id number] DOB: 18-Jun-1940  Referred by: [name], MD Reason for referral : Care Coordination   SW placed an unsuccessful outbound call to the patients son, [name], in response to a voce message received. SW left a HIPAA compliant voice message requesting a return call.  Follow Up Plan: SW will await return call from Mr. [place].  [name], BSW, CDP Social Worker, Certified Dementia Practitioner [facility] Care Management [phone]

## 2019-12-30 ENCOUNTER — Encounter: Payer: Self-pay | Admitting: Internal Medicine

## 2019-12-30 ENCOUNTER — Telehealth: Payer: Self-pay

## 2019-12-30 NOTE — Telephone Encounter (Signed)
I left a message for the pt's daughter to call back about some scat forms that she faxed over to be filled out for her mother, Pamela Key the Social worker filled out forms for the pt back in march and I was calling to get a fax number to fax dr sanders completed portion to her.

## 2019-12-31 ENCOUNTER — Ambulatory Visit (INDEPENDENT_AMBULATORY_CARE_PROVIDER_SITE_OTHER): Payer: Medicare HMO

## 2019-12-31 ENCOUNTER — Encounter: Payer: Medicare HMO | Admitting: Internal Medicine

## 2019-12-31 VITALS — BP 140/80 | HR 65 | Temp 98.0°F | Ht 62.0 in | Wt 183.4 lb

## 2019-12-31 DIAGNOSIS — Z0000 Encounter for general adult medical examination without abnormal findings: Secondary | ICD-10-CM | POA: Diagnosis not present

## 2019-12-31 NOTE — Progress Notes (Signed)
This visit occurred during the SARS-CoV-2 public health emergency.  Safety protocols were in place, including screening questions prior to the visit, additional usage of staff PPE, and extensive cleaning of exam room while observing appropriate contact time as indicated for disinfecting solutions.  Subjective:   Pamela Key is a 79 y.o. female who presents for Medicare Annual (Subsequent) preventive examination.  Review of Systems     Cardiac Risk Factors include: advanced age (>1355men, 49>65 women);diabetes mellitus;hypertension;obesity (BMI >30kg/m2)     Objective:    Today's Vitals   12/31/19 1524  BP: 140/80  Pulse: 65  Temp: 98 F (36.7 C)  TempSrc: Oral  SpO2: 97%  Weight: 183 lb 6.4 oz (83.2 kg)  Height: 5\' 2"  (1.575 m)   Body mass index is 33.54 kg/m.  Advanced Directives 12/31/2019 03/19/2019 02/16/2019 03/13/2018 10/10/2016 11/28/2015 10/28/2014  Does Patient Have a Medical Advance Directive? No No No No No No No  Would patient like information on creating a medical advance directive? - - Yes (MAU/Ambulatory/Procedural Areas - Information given) No - Patient declined - No - patient declined information -    Current Medications (verified) Outpatient Encounter Medications as of 12/31/2019  Medication Sig  . colchicine 0.6 MG tablet Take 1 tablet (0.6 mg total) by mouth daily.  [name]. lisinopril (ZESTRIL) 20 MG tablet Take 1 tablet (20 mg total) by mouth daily. (Patient not taking: Reported on 12/30/2019)  . metFORMIN (GLUCOPHAGE) 500 MG tablet Take 1 tablet (500 mg total) by mouth 2 (two) times daily. (Patient not taking: Reported on 10/06/2019)  . Misc Natural Products (BLOOD SUGAR BALANCE PO) Take by mouth.  . Multiple Vitamin (MULTIVITAMIN WITH MINERALS) TABS tablet Take 1 tablet by mouth daily with breakfast.  . pravastatin (PRAVACHOL) 40 MG tablet TAKE 1 TABLET BY MOUTH MONDAY - FRIDAY   No facility-administered encounter medications on file as of 12/31/2019.     Allergies (verified) Patient has no known allergies.   History: Past Medical History:  Diagnosis Date  . Diabetes mellitus without complication (HCC)   . Gout   . High cholesterol   . Hypertension    Past Surgical History:  Procedure Laterality Date  . TUBAL LIGATION  1976   Family History  Problem Relation Age of Onset  . Diabetes Mother   . Hypertension Mother   . Cancer Father   . Hypertension Father    Social History   Socioeconomic History  . Marital status: Divorced    Spouse name: Not on file  . Number of children: Not on file  . Years of education: Not on file  . Highest education level: Not on file  Occupational History  . Occupation: retired  Tobacco Use  . Smoking status: Former Smoker    Packs/day: 0.25    Types: Cigarettes    Quit date: 1976    Years since quitting: 45.6  . Smokeless tobacco: Never Used  . Tobacco comment: smoked at least 10 years, can't remember exact years  Vaping Use  . Vaping Use: Never used  Substance and Sexual Activity  . Alcohol use: Yes    Comment: on occassion  . Drug use: No  . Sexual activity: Not Currently  Other Topics Concern  . Not on file  Social History Narrative  . Not on file   Social Determinants of Health   Financial Resource Strain: Low Risk   . Difficulty of Paying Living Expenses: Not hard at all  Food Insecurity: No Food Insecurity  .  Worried About Programme researcher, broadcasting/film/video in the Last Year: Never true  . Ran Out of Food in the Last Year: Never true  Transportation Needs: Unmet Transportation Needs  . Lack of Transportation (Medical): Yes  . Lack of Transportation (Non-Medical): Yes  Physical Activity: Insufficiently Active  . Days of Exercise per Week: 7 days  . Minutes of Exercise per Session: 20 min  Stress: No Stress Concern Present  . Feeling of Stress : Not at all  Social Connections:   . Frequency of Communication with Friends and Family: Not on file  . Frequency of Social Gatherings  with Friends and Family: Not on file  . Attends Religious Services: Not on file  . Active Member of Clubs or Organizations: Not on file  . Attends Banker Meetings: Not on file  . Marital Status: Not on file    Tobacco Counseling Counseling given: Not Answered Comment: smoked at least 10 years, can't remember exact years   Clinical Intake:  Pre-visit preparation completed: Yes  Pain : No/denies pain     Nutritional Status: BMI > 30  Obese Nutritional Risks: None Diabetes: Yes  How often do you need to have someone help you when you read instructions, pamphlets, or other written materials from your doctor or pharmacy?: 1 - Never What is the last grade level you completed in school?: MASTER' SDEGREE  Diabetic? Yes Nutrition Risk Assessment:  Has the patient had any N/V/D within the last 2 months?  No  Does the patient have any non-healing wounds?  No  Has the patient had any unintentional weight loss or weight gain?  Yes   Diabetes:  Is the patient diabetic?  Yes  If diabetic, was a CBG obtained today?  No  Did the patient bring in their glucometer from home?  No  How often do you monitor your CBG's? 3-4 weekly.   Financial Strains and Diabetes Management:  Are you having any financial strains with the device, your supplies or your medication? No .  Does the patient want to be seen by Chronic Care Management for management of their diabetes?  No  Would the patient like to be referred to a Nutritionist or for Diabetic Management?  No   Diabetic Exams:  Diabetic Eye Exam: Completed 12/10/2019 Diabetic Foot Exam: Overdue, Pt has been advised about the importance in completing this exam. Pt is scheduled for diabetic foot exam on next appointment.   Interpreter Needed?: No  Information entered by :: NAllen LPN   Activities of Daily Living In your present state of health, do you have any difficulty performing the following activities: 12/31/2019 03/19/2019   Hearing? N N  Vision? N N  Difficulty concentrating or making decisions? N N  Walking or climbing stairs? N N  Dressing or bathing? N N  Doing errands, shopping? N N  Preparing Food and eating ? N N  Using the Toilet? N N  In the past six months, have you accidently leaked urine? N N  Do you have problems with loss of bowel control? N N  Managing your Medications? N N  Managing your Finances? N N  Housekeeping or managing your Housekeeping? N N  Some recent data might be hidden    Patient Care Team: [name], MD as PCP - General (Internal Medicine) [name] as Social Worker [place], Karma Lew, RN as Case Manager  Indicate any recent Medical Services you may have received from other than [facility] providers in the past  year (date may be approximate).     Assessment:   This is a routine wellness examination for [facility].  Hearing/Vision screen  Hearing Screening   125Hz  250Hz  500Hz  1000Hz  2000Hz  3000Hz  4000Hz  6000Hz  8000Hz   Right ear:           Left ear:           Vision Screening Comments: Regular  eye exams, Dr.  Dietary issues and exercise activities discussed: Current Exercise Habits: Home exercise routine, Type of exercise: walking, Time (Minutes): 20, Frequency (Times/Week): 7, Weekly Exercise (Minutes/Week): 140  Goals    . Patient Stated     03/19/2019, want to stay medically stable    . Patient Stated     12/31/2019, no goals      Depression Screen PHQ 2/9 Scores 12/31/2019 03/19/2019 02/11/2019 12/31/2018 03/13/2018  PHQ - 2 Score 0 0 0 0 0  PHQ- 9 Score - 0 - - -    Fall Risk Fall Risk  12/31/2019 03/19/2019 12/31/2018 03/13/2018  Falls in the past year? 0 0 0 -  Risk for fall due to : Medication side effect Medication side effect - Medication side effect  Follow up Falls evaluation completed;Education provided;Falls prevention discussed Falls evaluation completed;Education provided;Falls prevention discussed - -    Any stairs in or around the  home? Yes  If so, are there any without handrails? No  Home free of loose throw rugs in walkways, pet beds, electrical cords, etc? Yes  Adequate lighting in your home to reduce risk of falls? Yes   ASSISTIVE DEVICES UTILIZED TO PREVENT FALLS:  Life alert? No  Use of a cane, walker or w/c? No  Grab bars in the bathroom? Yes  Shower chair or bench in shower? No  Elevated toilet seat or a handicapped toilet? No   TIMED UP AND GO:  Was the test performed? No ..   Gait slow and steady without use of assistive device  Cognitive Function:     6CIT Screen 12/31/2019 06/17/2019 03/19/2019 03/13/2018  What Year? 0 points 0 points 0 points 0 points  What month? 0 points 0 points 0 points 0 points  What time? 0 points - 0 points 0 points  Count back from 20 0 points 0 points 0 points 0 points  Months in reverse 0 points 0 points 0 points 0 points  Repeat phrase 10 points 8 points 6 points 0 points  Total Score 10 - 6 0    Immunizations Immunization History  Administered Date(s) Administered  . Influenza, High Dose Seasonal PF 12/31/2018  . Moderna SARS-COVID-2 Vaccination 09/18/2019  . Pneumococcal Polysaccharide-23 05/29/2010    TDAP status: Up to date Flu Vaccine status: Up to date Pneumococcal vaccine status: Declines Covid-19 vaccine status: Missing a dose  Qualifies for Shingles Vaccine? Yes   Zostavax completed No   Shingrix Completed?: No.    Education has been provided regarding the importance of this vaccine. Patient has been advised to call insurance company to determine out of pocket expense if they have not yet received this vaccine. Advised may also receive vaccine at local pharmacy or Health Dept. Verbalized acceptance and understanding.  Screening Tests Health Maintenance  Topic Date Due  . COVID-19 Vaccine (2 - Moderna 2-dose series) 10/16/2019  . INFLUENZA VACCINE  12/13/2019  . FOOT EXAM  12/31/2019  . PNA vac Low Risk Adult (2 of 2 - PCV13) 12/30/2020  (Originally 05/30/2011)  . HEMOGLOBIN A1C  04/26/2020  . OPHTHALMOLOGY EXAM  12/09/2020  . TETANUS/TDAP  02/10/2023  . DEXA SCAN  Completed  . Hepatitis C Screening  Completed    Health Maintenance  Health Maintenance Due  Topic Date Due  . COVID-19 Vaccine (2 - Moderna 2-dose series) 10/16/2019  . INFLUENZA VACCINE  12/13/2019  . FOOT EXAM  12/31/2019    Colorectal cancer screening: Completed 09/20/2010. Repeat every 10 years Mammogram status: No longer required.  Bone Density status: Completed 03/13/2017.   Lung Cancer Screening: (Low Dose CT Chest recommended if Age 20-80 years, 30 pack-year currently smoking OR have quit w/in 15years.) does not qualify.   Lung Cancer Screening Referral: no  Additional Screening:  Hepatitis C Screening: does qualify; Completed 10/26/2019  Vision Screening: Recommended annual ophthalmology exams for early detection of glaucoma and other disorders of the eye. Is the patient up to date with their annual eye exam?  Yes  Who is the provider or what is the name of the office in which the patient attends annual eye exams? Dr. Dione Booze If pt is not established with a provider, would they like to be referred to a provider to establish care? No .   Dental Screening: Recommended annual dental exams for proper oral hygiene  Community Resource Referral / Chronic Care Management: CRR required this visit?  No   CCM required this visit?  No      Plan:     I have personally reviewed and noted the following in the patient's chart:   . Medical and social history . Use of alcohol, tobacco or illicit drugs  . Current medications and supplements . Functional ability and status . Nutritional status . Physical activity . Advanced directives . List of other physicians . Hospitalizations, surgeries, and ER visits in previous 12 months . Vitals . Screenings to include cognitive, depression, and falls . Referrals and appointments  In addition, I have  reviewed and discussed with patient certain preventive protocols, quality metrics, and best practice recommendations. A written personalized care plan for preventive services as well as general preventive health recommendations were provided to patient.     Barb Merino, LPN   [id number]/61/[id number]   Nurse Notes:

## 2019-12-31 NOTE — Patient Instructions (Signed)
Pamela Key , Thank you for taking time to come for your Medicare Wellness Visit. I appreciate your ongoing commitment to your health goals. Please review the following plan we discussed and let me know if I can assist you in the future.   Screening recommendations/referrals: Colonoscopy: not required Mammogram: not required Bone Density: completed 03/13/2017 Recommended yearly ophthalmology/optometry visit not requiredfor glaucoma screening and checkup Recommended yearly dental visit for hygiene and checkup  Vaccinations: Influenza vaccine: due Pneumococcal vaccine: decline Tdap vaccine: completed 02/09/2013 Shingles vaccine: discussed   Covid-19:did not get second dose  Advanced directives: Advance directive discussed with you today. Even though you declined this today please call our office should you change your mind and we can give you the proper paperwork for you to fill out.  Conditions/risks identified: none  Next appointment: 02/02/2020 at 2:45 Follow up in one year for your annual wellness visit    Preventive Care 65 Years and Older, Female Preventive care refers to lifestyle choices and visits with your health care provider that can promote health and wellness. What does preventive care include?  A yearly physical exam. This is also called an annual well check.  Dental exams once or twice a year.  Routine eye exams. Ask your health care provider how often you should have your eyes checked.  Personal lifestyle choices, including:  Daily care of your teeth and gums.  Regular physical activity.  Eating a healthy diet.  Avoiding tobacco and drug use.  Limiting alcohol use.  Practicing safe sex.  Taking low-dose aspirin every day.  Taking vitamin and mineral supplements as recommended by your health care provider. What happens during an annual well check? The services and screenings done by your health care provider during your annual well check will depend  on your age, overall health, lifestyle risk factors, and family history of disease. Counseling  Your health care provider may ask you questions about your:  Alcohol use.  Tobacco use.  Drug use.  Emotional well-being.  Home and relationship well-being.  Sexual activity.  Eating habits.  History of falls.  Memory and ability to understand (cognition).  Work and work Astronomer.  Reproductive health. Screening  You may have the following tests or measurements:  Height, weight, and BMI.  Blood pressure.  Lipid and cholesterol levels. These may be checked every 5 years, or more frequently if you are over 32 years old.  Skin check.  Lung cancer screening. You may have this screening every year starting at age 57 if you have a 30-pack-year history of smoking and currently smoke or have quit within the past 15 years.  Fecal occult blood test (FOBT) of the stool. You may have this test every year starting at age 49.  Flexible sigmoidoscopy or colonoscopy. You may have a sigmoidoscopy every 5 years or a colonoscopy every 10 years starting at age 10.  Hepatitis C blood test.  Hepatitis B blood test.  Sexually transmitted disease (STD) testing.  Diabetes screening. This is done by checking your blood sugar (glucose) after you have not eaten for a while (fasting). You may have this done every 1-3 years.  Bone density scan. This is done to screen for osteoporosis. You may have this done starting at age 1.  Mammogram. This may be done every 1-2 years. Talk to your health care provider about how often you should have regular mammograms. Talk with your health care provider about your test results, treatment options, and if necessary, the need for more  tests. Vaccines  Your health care provider may recommend certain vaccines, such as:  Influenza vaccine. This is recommended every year.  Tetanus, diphtheria, and acellular pertussis (Tdap, Td) vaccine. You may need a Td  booster every 10 years.  Zoster vaccine. You may need this after age 77.  Pneumococcal 13-valent conjugate (PCV13) vaccine. One dose is recommended after age 47.  Pneumococcal polysaccharide (PPSV23) vaccine. One dose is recommended after age 72. Talk to your health care provider about which screenings and vaccines you need and how often you need them. This information is not intended to replace advice given to you by your health care provider. Make sure you discuss any questions you have with your health care provider. Document Released: 05/27/2015 Document Revised: 01/18/2016 Document Reviewed: 03/01/2015 Elsevier Interactive Patient Education  2017 [place] Prevention in the Home Falls can cause injuries. They can happen to people of all ages. There are many things you can do to make your home safe and to help prevent falls. What can I do on the outside of my home?  Regularly fix the edges of walkways and driveways and fix any cracks.  Remove anything that might make you trip as you walk through a door, such as a raised step or threshold.  Trim any bushes or trees on the path to your home.  Use bright outdoor lighting.  Clear any walking paths of anything that might make someone trip, such as rocks or tools.  Regularly check to see if handrails are loose or broken. Make sure that both sides of any steps have handrails.  Any raised decks and porches should have guardrails on the edges.  Have any leaves, snow, or ice cleared regularly.  Use sand or salt on walking paths during winter.  Clean up any spills in your garage right away. This includes oil or grease spills. What can I do in the bathroom?  Use night lights.  Install grab bars by the toilet and in the tub and shower. Do not use towel bars as grab bars.  Use non-skid mats or decals in the tub or shower.  If you need to sit down in the shower, use a plastic, non-slip stool.  Keep the floor dry. Clean up  any water that spills on the floor as soon as it happens.  Remove soap buildup in the tub or shower regularly.  Attach bath mats securely with double-sided non-slip rug tape.  Do not have throw rugs and other things on the floor that can make you trip. What can I do in the bedroom?  Use night lights.  Make sure that you have a light by your bed that is easy to reach.  Do not use any sheets or blankets that are too big for your bed. They should not hang down onto the floor.  Have a firm chair that has side arms. You can use this for support while you get dressed.  Do not have throw rugs and other things on the floor that can make you trip. What can I do in the kitchen?  Clean up any spills right away.  Avoid walking on wet floors.  Keep items that you use a lot in easy-to-reach places.  If you need to reach something above you, use a strong step stool that has a grab bar.  Keep electrical cords out of the way.  Do not use floor polish or wax that makes floors slippery. If you must use wax, use non-skid floor  wax.  Do not have throw rugs and other things on the floor that can make you trip. What can I do with my stairs?  Do not leave any items on the stairs.  Make sure that there are handrails on both sides of the stairs and use them. Fix handrails that are broken or loose. Make sure that handrails are as long as the stairways.  Check any carpeting to make sure that it is firmly attached to the stairs. Fix any carpet that is loose or worn.  Avoid having throw rugs at the top or bottom of the stairs. If you do have throw rugs, attach them to the floor with carpet tape.  Make sure that you have a light switch at the top of the stairs and the bottom of the stairs. If you do not have them, ask someone to add them for you. What else can I do to help prevent falls?  Wear shoes that:  Do not have high heels.  Have rubber bottoms.  Are comfortable and fit you well.  Are  closed at the toe. Do not wear sandals.  If you use a stepladder:  Make sure that it is fully opened. Do not climb a closed stepladder.  Make sure that both sides of the stepladder are locked into place.  Ask someone to hold it for you, if possible.  Clearly mark and make sure that you can see:  Any grab bars or handrails.  First and last steps.  Where the edge of each step is.  Use tools that help you move around (mobility aids) if they are needed. These include:  Canes.  Walkers.  Scooters.  Crutches.  Turn on the lights when you go into a dark area. Replace any light bulbs as soon as they burn out.  Set up your furniture so you have a clear path. Avoid moving your furniture around.  If any of your floors are uneven, fix them.  If there are any pets around you, be aware of where they are.  Review your medicines with your doctor. Some medicines can make you feel dizzy. This can increase your chance of falling. Ask your doctor what other things that you can do to help prevent falls. This information is not intended to replace advice given to you by your health care provider. Make sure you discuss any questions you have with your health care provider. Document Released: 02/24/2009 Document Revised: 10/06/2015 Document Reviewed: 06/04/2014 Elsevier Interactive Patient Education  2017 [employer].

## 2020-01-01 ENCOUNTER — Ambulatory Visit: Payer: Medicare HMO

## 2020-01-01 DIAGNOSIS — I10 Essential (primary) hypertension: Secondary | ICD-10-CM

## 2020-01-01 DIAGNOSIS — R413 Other amnesia: Secondary | ICD-10-CM

## 2020-01-01 NOTE — Chronic Care Management (AMB) (Signed)
  Chronic Care Management   Outreach Note  01/01/2020 Name: Pamela Key DOBOSZ MRN: [id number] DOB: 03-17-1941  Referred by: [name], MD Reason for referral : Care Coordination   SW collaboration with Elisha Ponder, LPN on 8/34/19 who requested SW outreach the patient to review SCAT benefit. SW requested [name] provide the patient with a SCAT brochure while in the office for patient to review.  Successful outbound call placed to the patient on 01/01/20 to review SCAT program. Advised the patient she has been eligible for this transportation service since April 2021. Provided education surrounding how to access this benefit.   Follow Up Plan: No SW follow up planned at this time. The patient is encouraged to contact SW directly with future resource needs.  [name], BSW, CDP Social Worker, Certified Dementia Practitioner [facility] Care Management [phone]

## 2020-01-04 ENCOUNTER — Ambulatory Visit: Payer: Medicare HMO

## 2020-01-04 ENCOUNTER — Telehealth: Payer: Self-pay

## 2020-01-04 DIAGNOSIS — E1122 Type 2 diabetes mellitus with diabetic chronic kidney disease: Secondary | ICD-10-CM

## 2020-01-04 DIAGNOSIS — N182 Chronic kidney disease, stage 2 (mild): Secondary | ICD-10-CM

## 2020-01-04 DIAGNOSIS — R413 Other amnesia: Secondary | ICD-10-CM

## 2020-01-04 DIAGNOSIS — I10 Essential (primary) hypertension: Secondary | ICD-10-CM

## 2020-01-04 NOTE — Chronic Care Management (AMB) (Signed)
  Care Management   Follow Up Note   01/04/2020 Name: Pamela Key MRN: [id number] DOB: 08-20-1940  Referred by: [name], MD Reason for referral : Care Coordination   Pamela Key is a 79 y.o. year old female who is a primary care patient of [name], MD. The care management team was consulted for assistance with care management and care coordination needs.    Review of patient status, including review of consultants reports, relevant laboratory and other test results, and collaboration with appropriate care team members and the patient's provider was performed as part of comprehensive patient evaluation and provision of chronic care management services.    SDOH (Social Determinants of Health) assessments performed: Yes See Care Plan activities for detailed interventions related to SDOH)  SDOH Interventions     Most Recent Value  SDOH Interventions  Housing Interventions [id number] Referral       Advanced Directives: See Care Plan and Vynca application for related entries.   SW received inbound call from the patients son, Elon Alas to discuss patient ongoing resource needs. [name] reports he has linked patient to the [facility], part of [employer] [place], to attend daily for activity and engagement. [name] is assisting the patient is scheduling transportation via SCAT.  [name] reports interest in having someone visit the home daily to ensure the patient is taking medications as prescribed. Reviewed resources including [facility] in home aide program, privately hiring a caregiver, and Medicaid PCS. [name] feels the patient would qualify for Medicaid but is unclear of her exact income amount. SW provided education to Mr. Washington how to apply for Medicaid via online application.   SW discussed opportunity to request home health orders for temporary oversight in the home. [name]  understands this is a temporary program and is agreeable. SW collaboration with Dr. Allyne Gee to inquire if orders for home health RN and SW would be appropriate.  [name] also expressed interest in home repair resources but is unable to provide specific repair needs. He does report concern for pests. SW advised there are no home modification resources that assist with pest control but that SW can place a referral to a local agency to assist with other repair needs. SW placed referral to [employer] via [employer].   SW will plan to follow up with [name] over the next 3 weeks.  [name], BSW, CDP Social Worker, Certified Dementia Practitioner [facility] Care Management [phone]

## 2020-01-04 NOTE — Telephone Encounter (Signed)
  Chronic Care Management   Outreach Note  01/04/2020 Name: Pamela Key MRN: [id number] DOB: 12-27-1940  Referred by: [name], MD Reason for referral : Care Coordination   SW placed an unsuccessful outbound call to the patients son, Pamela Key, in response to a voice message received. SW left a HIPAA compliant voice message requesting a return call.  Follow Up Plan: SW is available to engage with Mr. Pamela Key upon receiving a return call.  [name], BSW, CDP Social Worker, Certified Dementia Practitioner [facility] Care Management [phone]

## 2020-01-17 NOTE — Final Progress Note (Signed)
Erroneous. Pt not seen. Precharted, but appt cancelled.

## 2020-01-23 ENCOUNTER — Encounter (HOSPITAL_COMMUNITY): Payer: Self-pay | Admitting: Emergency Medicine

## 2020-01-23 ENCOUNTER — Emergency Department (HOSPITAL_COMMUNITY)
Admission: EM | Admit: 2020-01-23 | Discharge: 2020-01-23 | Disposition: A | Discharge status: Home / Self Care | Payer: Medicare Other | Attending: Emergency Medicine | Admitting: Emergency Medicine

## 2020-01-23 ENCOUNTER — Emergency Department (HOSPITAL_COMMUNITY): Payer: Medicare Other

## 2020-01-23 DIAGNOSIS — M109 Gout, unspecified: Secondary | ICD-10-CM

## 2020-01-23 DIAGNOSIS — M19072 Primary osteoarthritis, left ankle and foot: Secondary | ICD-10-CM | POA: Diagnosis not present

## 2020-01-23 DIAGNOSIS — I10 Essential (primary) hypertension: Secondary | ICD-10-CM | POA: Diagnosis not present

## 2020-01-23 DIAGNOSIS — Z7984 Long term (current) use of oral hypoglycemic drugs: Secondary | ICD-10-CM | POA: Insufficient documentation

## 2020-01-23 DIAGNOSIS — Z79899 Other long term (current) drug therapy: Secondary | ICD-10-CM | POA: Insufficient documentation

## 2020-01-23 DIAGNOSIS — E119 Type 2 diabetes mellitus without complications: Secondary | ICD-10-CM | POA: Insufficient documentation

## 2020-01-23 DIAGNOSIS — M7989 Other specified soft tissue disorders: Secondary | ICD-10-CM | POA: Diagnosis not present

## 2020-01-23 DIAGNOSIS — Z87891 Personal history of nicotine dependence: Secondary | ICD-10-CM | POA: Insufficient documentation

## 2020-01-23 DIAGNOSIS — M2012 Hallux valgus (acquired), left foot: Secondary | ICD-10-CM | POA: Diagnosis not present

## 2020-01-23 DIAGNOSIS — M79672 Pain in left foot: Secondary | ICD-10-CM | POA: Diagnosis present

## 2020-01-23 DIAGNOSIS — M21612 Bunion of left foot: Secondary | ICD-10-CM | POA: Diagnosis not present

## 2020-01-23 LAB — CBC WITH DIFFERENTIAL/PLATELET
Abs Immature Granulocytes: 0.05 10*3/uL (ref 0.00–0.07)
Basophils Absolute: 0 10*3/uL (ref 0.0–0.1)
Basophils Relative: 0 %
Eosinophils Absolute: 0 10*3/uL (ref 0.0–0.5)
Eosinophils Relative: 0 %
HCT: 39.2 % (ref 36.0–46.0)
Hemoglobin: 12.5 g/dL (ref 12.0–15.0)
Immature Granulocytes: 1 %
Lymphocytes Relative: 24 %
Lymphs Abs: 2.1 10*3/uL (ref 0.7–4.0)
MCH: 30.5 pg (ref 26.0–34.0)
MCHC: 31.9 g/dL (ref 30.0–36.0)
MCV: 95.6 fL (ref 80.0–100.0)
Monocytes Absolute: 1 10*3/uL (ref 0.1–1.0)
Monocytes Relative: 11 %
Neutro Abs: 5.5 10*3/uL (ref 1.7–7.7)
Neutrophils Relative %: 64 %
Platelets: 181 10*3/uL (ref 150–400)
RBC: 4.1 MIL/uL (ref 3.87–5.11)
RDW: 14.6 % (ref 11.5–15.5)
WBC: 8.5 10*3/uL (ref 4.0–10.5)
nRBC: 0 % (ref 0.0–0.2)

## 2020-01-23 LAB — BASIC METABOLIC PANEL
Anion gap: 15 (ref 5–15)
BUN: 6 mg/dL — ABNORMAL LOW (ref 8–23)
CO2: 25 mmol/L (ref 22–32)
Calcium: 8.9 mg/dL (ref 8.9–10.3)
Chloride: 101 mmol/L (ref 98–111)
Creatinine, Ser: 0.82 mg/dL (ref 0.44–1.00)
GFR calc Af Amer: >60 mL/min (ref >60)
GFR calc non Af Amer: >60 mL/min (ref >60)
Glucose, Bld: 118 mg/dL — ABNORMAL HIGH (ref 70–99)
Potassium: 2.9 mmol/L — ABNORMAL LOW (ref 3.5–5.1)
Sodium: 141 mmol/L (ref 135–145)

## 2020-01-23 MED ORDER — OXYCODONE-ACETAMINOPHEN 5-325 MG PO TABS
1.0000 | ORAL_TABLET | Freq: Once | ORAL | Status: AC
  Administered 2020-01-23: 1 via ORAL
  Filled 2020-01-23: qty 1

## 2020-01-23 MED ORDER — HYDROCODONE-ACETAMINOPHEN 5-325 MG PO TABS
1.0000 | ORAL_TABLET | Freq: Once | ORAL | Status: AC
  Administered 2020-01-23: 1 via ORAL
  Filled 2020-01-23: qty 1

## 2020-01-23 MED ORDER — CEPHALEXIN 500 MG PO CAPS: 500.0000 mg | ORAL_CAPSULE | Freq: Two times a day (BID) | ORAL | 0 refills | Status: DC

## 2020-01-23 MED ORDER — COLCHICINE 0.6 MG PO TABS
0.6000 mg | ORAL_TABLET | Freq: Once | ORAL | Status: AC
  Administered 2020-01-23: 0.6 mg via ORAL
  Filled 2020-01-23: qty 1

## 2020-01-23 MED ORDER — COLCHICINE 0.6 MG PO TABS: 0.6000 mg | ORAL_TABLET | Freq: Every day | ORAL | 0 refills | Status: DC

## 2020-01-23 MED ORDER — COLCHICINE 0.6 MG PO TABS
1.2000 mg | ORAL_TABLET | Freq: Once | ORAL | Status: AC
  Administered 2020-01-23: 1.2 mg via ORAL
  Filled 2020-01-23: qty 2

## 2020-01-23 NOTE — Discharge Instructions (Signed)
Take your second dose of colchicine one hour after discharge from the Emergency Department.  You may start your prescription for colchicine tomorrow.

## 2020-01-23 NOTE — ED Provider Notes (Signed)
[facility] EMERGENCY DEPARTMENT Provider Note   CSN: [id number] Arrival date & time: 01/23/20  1245     History Chief Complaint  Patient presents with  . Foot Pain    Pamela Key is a 79 y.o. female.  The history is provided by the patient and medical records. No language interpreter was used.  Foot Pain   Pamela Key is a 79 y.o. female who presents to the Emergency Department complaining of foot pain. She presents the emergency department complaining of left foot pain and swelling. Symptoms began with mild swelling last night and today she developed significant pain as well. She has a history of diabetes and gout. Symptoms are similar to prior episodes of gout. She previously took colchicine daily but ran out of the medication a few weeks ago. Denies any fevers, systemic symptoms. Symptoms are moderate to severe, constant, worsening.    Past Medical History:  Diagnosis Date  . Diabetes mellitus without complication (HCC)   . Gout   . High cholesterol   . Hypertension     Patient Active Problem List   Diagnosis Date Noted  . Pure hypercholesterolemia 06/17/2018  . Class 1 obesity due to excess calories with serious comorbidity and body mass index (BMI) of 33.0 to 33.9 in adult 06/17/2018  . Type II diabetes mellitus, uncontrolled (HCC)   . 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 TUBAL LIGATION  1976     OB History   No obstetric history on file.     Family History  Problem Relation Age of Onset  . Diabetes Mother   . Hypertension Mother   . Cancer Father   . Hypertension Father     Social History   Tobacco Use  . Smoking status: Former Smoker    Packs/day: 0.25    Types: Cigarettes    Quit date: 1976    Years since quitting: 45.7  . Smokeless tobacco: Never Used  . Tobacco comment: smoked at least 10 years, can't remember exact years    Vaping Use  . Vaping Use: Never used  Substance Use Topics  . Alcohol use: Yes    Comment: on occassion  . Drug use: No    Home Medications Prior to Admission medications   Medication Sig Start Date End Date Taking? Authorizing Provider  cephALEXin (KEFLEX) 500 MG capsule Take 1 capsule (500 mg total) by mouth 2 (two) times daily. 01/23/20   Tilden Fossa, MD  colchicine 0.6 MG tablet Take 1 tablet (0.6 mg total) by mouth daily for 5 days. 01/23/20 01/28/20  Tilden Fossa, MD  lisinopril (ZESTRIL) 20 MG tablet Take 1 tablet (20 mg total) by mouth daily. Patient not taking: Reported on 12/30/2019 09/16/19   [name], MD  metFORMIN (GLUCOPHAGE) 500 MG tablet Take 1 tablet (500 mg total) by mouth 2 (two) times daily. Patient not taking: Reported on 10/06/2019 09/16/19   [name], MD  Misc Natural Products (BLOOD SUGAR BALANCE PO) Take by mouth.    [provider]  Multiple Vitamin (MULTIVITAMIN WITH MINERALS) TABS tablet Take 1 tablet by mouth daily with breakfast.    [provider]  pravastatin (PRAVACHOL) 40 MG tablet TAKE 1 TABLET BY MOUTH MONDAY - FRIDAY 12/16/19   [name], MD    Allergies    Patient has no known allergies.  Review of Systems   Review of Systems  All  other systems reviewed and are negative.   Physical Exam Updated Vital Signs BP (!) 166/83 (BP Location: Right Arm)   Pulse 80   Temp 99.6 F (37.6 C) (Oral)   Resp 16   SpO2 100%   Physical Exam Vitals and nursing note reviewed.  Constitutional:      Appearance: She is well-developed.  HENT:     Head: Normocephalic and atraumatic.  Cardiovascular:     Rate and Rhythm: Normal rate and regular rhythm.  Pulmonary:     Effort: Pulmonary effort is normal. No respiratory distress.  Musculoskeletal:     Comments: 2+ DP pulses bilaterally. There is moderate soft tissue swelling throughout the entire left foot, predominantly over the mid foot. There is local tenderness to  palpation throughout the foot. There is no crepitance.  Skin:    General: Skin is warm and dry.  Neurological:     Mental Status: She is alert and oriented to person, place, and time.  Psychiatric:        Behavior: Behavior normal.     ED Results / Procedures / Treatments   Labs (all labs ordered are listed, but only abnormal results are displayed) Labs Reviewed  BASIC METABOLIC PANEL - Abnormal; Notable for the following components:      Result Value   Potassium 2.9 (*)    Glucose, Bld 118 (*)    BUN 6 (*)    All other components within normal limits  CBC WITH DIFFERENTIAL/PLATELET    EKG None  Radiology DG Foot Complete Left  Result Date: 01/23/2020 CLINICAL DATA:  Pain, swelling, and redness of LEFT foot since yesterday, no known injury, history of diabetes mellitus. Unable to bear weight. Symptoms greatest at base of great toe EXAM: LEFT FOOT - COMPLETE 3+ VIEW COMPARISON:  None FINDINGS: Osseous demineralization. Degenerative changes and hallux valgus at first MTP joint. Bunion deformity at medial first metatarsal head. Diffuse soft tissue swelling greatest medial to first MTP joint. No acute fracture, dislocation, or bone destruction. IMPRESSION: Hallux valgus and bunion deformity of LEFT first metatarsal head with degenerative changes of first MTP joint. Soft tissue swelling without acute bony findings. Electronically Signed   By: Ulyses Southward M.D.   On: 01/23/2020 14:19    Procedures Procedures (including critical care time)  Medications Ordered in ED Medications  colchicine tablet 1.2 mg (has no administration in time range)  HYDROcodone-acetaminophen (NORCO/VICODIN) 5-325 MG per tablet 1 tablet (has no administration in time range)  colchicine tablet 0.6 mg (has no administration in time range)  oxyCODONE-acetaminophen (PERCOCET/ROXICET) 5-325 MG per tablet 1 tablet (1 tablet Oral Given 01/23/20 1834)    ED Course  I have reviewed the triage vital signs and the  nursing notes.  Pertinent labs & imaging results that were available during my care of the patient were reviewed by me and considered in my medical decision making (see chart for details).    MDM Rules/Calculators/A&P                         Patient with history of gout here for evaluation of foot swelling and pain. She is non-toxic appearing on evaluation. Presentation is consistent with acute gouty arthritis, doubt septic arthritis. Will treat with empiric antibiotics for possible coexistence cellulitis. Discussed with patient home care for gout, outpatient follow-up and return precautions.  Final Clinical Impression(s) / ED Diagnoses Final diagnoses:  Acute gout of left foot, unspecified cause    Rx /  DC Orders ED Discharge Orders         Ordered    colchicine 0.6 MG tablet  Daily        01/23/20 2141    cephALEXin (KEFLEX) 500 MG capsule  2 times daily        01/23/20 2142           Tilden Fossa, MD 01/23/20 2145

## 2020-01-23 NOTE — ED Notes (Signed)
Verbalized understanding of DC instructions, Rx, follow up care. Grandson picking pt up

## 2020-01-23 NOTE — ED Triage Notes (Signed)
Emergency Medicine Provider Triage Evaluation Note  Pamela Key , a 79 y.o. female  was evaluated in triage.  Pt complains of left foot pain and swelling beginning yesterday evening. She ate shrimp either last night or the night before. Pain is severe. Denies trauma. She states she has had gout before but the pain today is worse. No fevers, numbness or weakness. Has not had her lisinopril for the last week as she has run out.  Review of Systems  Positive: Pain, swelling Negative: Fevers, numbness  Physical Exam  BP (!) 153/81 (BP Location: Right Arm)   Pulse 82   Temp 99.4 F (37.4 C) (Oral)   Resp 18   SpO2 99%  Gen:   Awake, no distress  HEENT:  Atraumatic  Resp:  Normal effort  Cardiac:  Normal rate, 2+ DP/PT pulses bilaterally Abd:   Nondistended, nontender  MSK:   Dorsum of left foot with swelling and erythema distally. Fairly diffuse tenderness to palpation but worse along the lateral aspect of the dorsum of the left foot. 5/5 strength of BLE major muscle groups. There is some tenderness to palpation overlying the first MTP joint. No calf pain or swelling. Denna Haggard' sign absent bilaterally. Neuro:  Speech clear, sensation intact to light touch of bilateral lower extremities  Medical Decision Making  Medically screening exam initiated at 1:22 PM.  Appropriate orders placed.  Mendel Corning was informed that the remainder of the evaluation will be completed by another provider, this initial triage assessment does not replace that evaluation, and the importance of remaining in the ED until their evaluation is complete.  Clinical Impression  Differential includes cellulitis and gout. Low suspicion of DVT or septic arthritis at this time. Will obtain lab work and x-ray and give Percocet for pain control.   Jeanie Sewer, PA-C 01/23/20 1324

## 2020-01-23 NOTE — ED Triage Notes (Signed)
Pt. Stated, I started having left foot pain yesterday , really painful and swollen.

## 2020-01-28 ENCOUNTER — Other Ambulatory Visit: Payer: Self-pay | Admitting: Internal Medicine

## 2020-01-28 DIAGNOSIS — E785 Hyperlipidemia, unspecified: Secondary | ICD-10-CM | POA: Diagnosis not present

## 2020-01-28 DIAGNOSIS — M109 Gout, unspecified: Secondary | ICD-10-CM | POA: Diagnosis not present

## 2020-01-28 DIAGNOSIS — E1169 Type 2 diabetes mellitus with other specified complication: Secondary | ICD-10-CM | POA: Diagnosis not present

## 2020-02-02 ENCOUNTER — Ambulatory Visit: Payer: Medicare HMO | Admitting: Internal Medicine

## 2020-03-23 ENCOUNTER — Encounter: Payer: Self-pay | Admitting: Internal Medicine

## 2020-03-23 ENCOUNTER — Ambulatory Visit (INDEPENDENT_AMBULATORY_CARE_PROVIDER_SITE_OTHER): Payer: Medicare Other | Admitting: Internal Medicine

## 2020-03-23 ENCOUNTER — Other Ambulatory Visit: Payer: Self-pay

## 2020-03-23 VITALS — BP 142/76 | HR 63 | Temp 98.4°F | Ht 62.2 in | Wt 175.4 lb

## 2020-03-23 DIAGNOSIS — Z0000 Encounter for general adult medical examination without abnormal findings: Secondary | ICD-10-CM | POA: Diagnosis not present

## 2020-03-23 DIAGNOSIS — E1122 Type 2 diabetes mellitus with diabetic chronic kidney disease: Secondary | ICD-10-CM

## 2020-03-23 DIAGNOSIS — E6609 Other obesity due to excess calories: Secondary | ICD-10-CM

## 2020-03-23 DIAGNOSIS — M109 Gout, unspecified: Secondary | ICD-10-CM | POA: Diagnosis not present

## 2020-03-23 DIAGNOSIS — I129 Hypertensive chronic kidney disease with stage 1 through stage 4 chronic kidney disease, or unspecified chronic kidney disease: Secondary | ICD-10-CM | POA: Diagnosis not present

## 2020-03-23 DIAGNOSIS — N182 Chronic kidney disease, stage 2 (mild): Secondary | ICD-10-CM

## 2020-03-23 DIAGNOSIS — Z6831 Body mass index (BMI) 31.0-31.9, adult: Secondary | ICD-10-CM

## 2020-03-23 LAB — POCT UA - MICROALBUMIN
Creatinine, POC: 200 mg/dL
Microalbumin Ur, POC: 80 mg/L

## 2020-03-23 LAB — POCT URINALYSIS DIPSTICK
Bilirubin, UA: NEGATIVE
Blood, UA: NEGATIVE
Glucose, UA: NEGATIVE
Ketones, UA: NEGATIVE
Leukocytes, UA: NEGATIVE
Nitrite, UA: NEGATIVE
Protein, UA: POSITIVE — AB
Spec Grav, UA: >=1.03 — AB (ref 1.010–1.025)
Urobilinogen, UA: 2 E.U./dL — AB
pH, UA: 5.5 (ref 5.0–8.0)

## 2020-03-23 MED ORDER — TRIAMCINOLONE ACETONIDE 40 MG/ML IJ SUSP
60.0000 mg | Freq: Once | INTRAMUSCULAR | Status: AC
  Administered 2020-03-23: 60 mg via INTRAMUSCULAR

## 2020-03-23 NOTE — Progress Notes (Signed)
I,Tianna Badgett,acting as a scribe for  N , MD.,have documented all relevant documentation on the behalf of  N , MD,as directed by   N , MD while in the presence of  N , MD.  This visit occurred during the SARS-CoV-2 public health emergency.  Safety protocols were in place, including screening questions prior to the visit, additional usage of staff PPE, and extensive cleaning of exam room while observing appropriate contact time as indicated for disinfecting solutions.  Subjective:     Patient ID: Pamela Key , female    DOB: 07/31/1940 , 79 y.o.   MRN: [id number]   Chief Complaint  Patient presents with  . Annual Exam  . Hypertension  . Diabetes    HPI  She presents today for a full physical examination.  She is no longer followed by GYN.  She has no specific concerns at this time. She reports she recently got back from a trip with her son.   Diabetes She presents for her follow-up diabetic visit. She has type 2 diabetes mellitus. Her disease course has been stable. There are no hypoglycemic associated symptoms. There are no diabetic associated symptoms. Pertinent negatives for diabetes include no blurred vision and no chest pain. There are no hypoglycemic complications. There are no diabetic complications. Risk factors for coronary artery disease include diabetes mellitus, dyslipidemia and hypertension. She is following a generally healthy diet. She participates in exercise intermittently. Her home blood glucose trend is fluctuating minimally. Her breakfast blood glucose is taken between 8-9 am. Her breakfast blood glucose range is generally 90-110 mg/dl. An ACE inhibitor/angiotensin II receptor blocker is being taken. She does not see a podiatrist.Eye exam is current.  Hypertension This is a chronic problem. The current episode started more than 1 year ago. The problem has been gradually improving since onset. The problem is  controlled. Pertinent negatives include no blurred vision or chest pain. Risk factors for coronary artery disease include diabetes mellitus, dyslipidemia, post-menopausal state, obesity and sedentary lifestyle. Past treatments include ACE inhibitors. The current treatment provides moderate improvement. Compliance problems include exercise.  Hypertensive end-organ damage includes kidney disease.     Past Medical History:  Diagnosis Date  . Diabetes mellitus without complication (HCC)   . Gout   . High cholesterol   . Hypertension      Family History  Problem Relation Age of Onset  . Diabetes Mother   . Hypertension Mother   . Cancer Father   . Hypertension Father      Current Outpatient Medications:  .  cephALEXin (KEFLEX) 500 MG capsule, Take 1 capsule (500 mg total) by mouth 2 (two) times daily., Disp: 10 capsule, Rfl: 0 .  ferrous sulfate 325 (65 FE) MG tablet, Take 325 mg by mouth See admin instructions. Take one tablet (325 mg) by mouth daily Monday thru Friday (skip Saturday and Sunday), Disp: , Rfl:  .  lisinopril (ZESTRIL) 20 MG tablet, TAKE 1 TABLET BY MOUTH EVERY DAY, Disp: 90 tablet, Rfl: 1 .  Misc Natural Products (BLOOD SUGAR BALANCE PO), Take 1 capsule by mouth daily. , Disp: , Rfl:  .  Multiple Vitamin (MULTIVITAMIN WITH MINERALS) TABS tablet, Take 1 tablet by mouth daily with breakfast., Disp: , Rfl:  .  pravastatin (PRAVACHOL) 40 MG tablet, TAKE 1 TABLET BY MOUTH MONDAY - FRIDAY (Patient taking differently: Take 40 mg by mouth See admin instructions. Take one tablet (40 mg) by mouth daily on Monday thru Friday (skip Saturday and Sunday)),   Disp: 75 tablet, Rfl: 1 .  allopurinol (ZYLOPRIM) 100 MG tablet, Take 1 tablet (100 mg total) by mouth daily., Disp: 30 tablet, Rfl: 1 .  colchicine 0.6 MG tablet, Take 1 tablet (0.6 mg total) by mouth daily for 5 days., Disp: 5 tablet, Rfl: 0   No Known Allergies    The patient states she uses post menopausal status for birth  control. Last LMP was No LMP recorded. Patient is postmenopausal.. Negative for Dysmenorrhea. Negative for: breast discharge, breast lump(s), breast pain and breast self exam. Associated symptoms include abnormal vaginal bleeding. Pertinent negatives include abnormal bleeding (hematology), anxiety, decreased libido, depression, difficulty falling sleep, dyspareunia, history of infertility, nocturia, sexual dysfunction, sleep disturbances, urinary incontinence, urinary urgency, vaginal discharge and vaginal itching. Diet regular.The patient states her exercise level is  minimal.   . The patient's tobacco use is:  Social History   Tobacco Use  Smoking Status Former Smoker  . Packs/day: 0.25  . Types: Cigarettes  . Quit date: 29  . Years since quitting: 45.9  Smokeless Tobacco Never Used  Tobacco Comment   smoked at least 10 years, can't remember exact years  . She has been exposed to passive smoke. The patient's alcohol use is:  Social History   Substance and Sexual Activity  Alcohol Use Yes   Comment: on occassion    Review of Systems  Constitutional: Negative.   HENT: Negative.   Eyes: Negative.  Negative for blurred vision.  Respiratory: Negative.   Cardiovascular: Negative.  Negative for chest pain.  Gastrointestinal: Negative.   Endocrine: Negative.   Genitourinary: Negative.   Musculoskeletal: Positive for arthralgias.       Gout flare  Skin: Negative.   Allergic/Immunologic: Negative.   Neurological: Negative.   Hematological: Negative.   Psychiatric/Behavioral: Negative.      Today's Vitals   03/23/20 1431  BP: (!) 142/76  Pulse: 63  Temp: 98.4 F (36.9 C)  TempSrc: Oral  Weight: 175 lb 6.4 oz (79.6 kg)  Height: 5' 2.2" (1.58 m)   Body mass index is 31.88 kg/m.  Wt Readings from Last 3 Encounters:  03/23/20 175 lb 6.4 oz (79.6 kg)  12/31/19 183 lb 6.4 oz (83.2 kg)  10/26/19 188 lb 3.2 oz (85.4 kg)     Objective:  Physical Exam Vitals and nursing  note reviewed.  Constitutional:      Appearance: Normal appearance. She is obese.  HENT:     Head: Normocephalic and atraumatic.     Right Ear: Tympanic membrane, ear canal and external ear normal.     Left Ear: Tympanic membrane, ear canal and external ear normal.     Nose:     Comments: Deferred,masked    Mouth/Throat:     Comments: Deferred, masked Eyes:     Extraocular Movements: Extraocular movements intact.     Conjunctiva/sclera: Conjunctivae normal.     Pupils: Pupils are equal, round, and reactive to light.  Cardiovascular:     Rate and Rhythm: Normal rate and regular rhythm.     Pulses: Normal pulses.          Dorsalis pedis pulses are 2+ on the right side and 2+ on the left side.     Heart sounds: Normal heart sounds.  Pulmonary:     Effort: Pulmonary effort is normal.     Breath sounds: Normal breath sounds.  Chest:     Breasts: Tanner Score is 5.        Right: Normal.  Left: Normal.  Abdominal:     General: Bowel sounds are normal.     Palpations: Abdomen is soft.     Comments: Obese, soft.   Genitourinary:    Comments: deferred Musculoskeletal:        General: Normal range of motion.     Cervical back: Normal range of motion and neck supple.  Feet:     Right foot:     Protective Sensation: 5 sites tested. 5 sites sensed.     Skin integrity: Callus and dry skin present.     Toenail Condition: Right toenails are long.     Left foot:     Protective Sensation: 5 sites tested. 5 sites sensed.     Skin integrity: Erythema, warmth, callus and dry skin present.     Toenail Condition: Left toenails are long.  Skin:    General: Skin is warm and dry.  Neurological:     General: No focal deficit present.     Mental Status: She is alert and oriented to person, place, and time.  Psychiatric:        Mood and Affect: Mood normal.        Behavior: Behavior normal.         Assessment And Plan:     1. Routine general medical examination at health care  facility Comments: A full exam was performed. Importance of monthly self breast exams was discussed with the patient. PATIENT IS ADVISED TO GET 30-45 MINUTES REGULAR EXERCISE NO LESS THAN FOUR TO FIVE DAYS PER WEEK - BOTH WEIGHTBEARING EXERCISES AND AEROBIC ARE RECOMMENDED.  PATIENT IS ADVISED TO FOLLOW A HEALTHY DIET WITH AT LEAST SIX FRUITS/VEGGIES PER DAY, DECREASE INTAKE OF RED MEAT, AND TO INCREASE FISH INTAKE TO TWO DAYS PER WEEK.  MEATS/FISH SHOULD NOT BE FRIED, BAKED OR BROILED IS PREFERABLE.  I SUGGEST WEARING SPF 50 SUNSCREEN ON EXPOSED PARTS AND ESPECIALLY WHEN IN THE DIRECT SUNLIGHT FOR AN EXTENDED PERIOD OF TIME.  PLEASE AVOID FAST FOOD RESTAURANTS AND INCREASE YOUR WATER INTAKE.  2. Type 2 diabetes mellitus with stage 2 chronic kidney disease, without long-term current use of insulin (HCC) Comments: Diabetic foot exam was performed. I DISCUSSED WITH THE PATIENT AT LENGTH REGARDING THE GOALS OF GLYCEMIC CONTROL AND POSSIBLE LONG-TERM COMPLICATIONS.  I  ALSO STRESSED THE IMPORTANCE OF COMPLIANCE WITH HOME GLUCOSE MONITORING, DIETARY RESTRICTIONS INCLUDING AVOIDANCE OF SUGARY DRINKS/PROCESSED FOODS,  ALONG WITH REGULAR EXERCISE.  I  ALSO STRESSED THE IMPORTANCE OF ANNUAL EYE EXAMS, SELF FOOT CARE AND COMPLIANCE WITH OFFICE VISITS.  - Hemoglobin A1c - POCT Urinalysis Dipstick (81002) - POCT UA - Microalbumin  3. Hypertensive nephropathy Comments: Chronic, fair control. She will continue with current meds. She is encouraged to avoid adding salt to her foods. EKG performed, NSR w/o acute changes.  - CMP14+EGFR - Lipid panel - POCT Urinalysis Dipstick (81002) - POCT UA - Microalbumin - EKG 12-Lead  4. Acute gout involving toe of left foot, unspecified cause Comments: She was given kenalog, 88m IM x 1. I will check uric acid level today. I will make further recommendations once her labs are available. Unfortunately, she is not taking allopurinol - not sure why. - Uric acid - triamcinolone  acetonide (KENALOG-40) injection 60 mg  5. Class 1 obesity due to excess calories with serious comorbidity and body mass index (BMI) of 31.0 to 31.9 in adult Comments: She is encouraged to strive to lose ten pounds to decrease cardiac risk. Advised to aim for at  least 150 minutes of exercise per week.  She is encouraged to strive for BMI less than 30 to decrease cardiac risk. Advised to aim for at least 150 minutes of exercise per week.     Patient was given opportunity to ask questions. Patient verbalized understanding of the plan and was able to repeat key elements of the plan. All questions were answered to their satisfaction.   [name], MD   I, [name], MD, have reviewed all documentation for this visit. The documentation on 04/10/20 for the exam, diagnosis, procedures, and orders are all accurate and complete.  THE PATIENT IS ENCOURAGED TO PRACTICE SOCIAL DISTANCING DUE TO THE COVID-19 PANDEMIC.

## 2020-03-23 NOTE — Patient Instructions (Signed)
Health Maintenance, Female Adopting a healthy lifestyle and getting preventive care are important in promoting health and wellness. Ask your health care provider about:  The right schedule for you to have regular tests and exams.  Things you can do on your own to prevent diseases and keep yourself healthy. What should I know about diet, weight, and exercise? Eat a healthy diet   Eat a diet that includes plenty of vegetables, fruits, low-fat dairy products, and lean protein.  Do not eat a lot of foods that are high in solid fats, added sugars, or sodium. Maintain a healthy weight Body mass index (BMI) is used to identify weight problems. It estimates body fat based on height and weight. Your health care provider can help determine your BMI and help you achieve or maintain a healthy weight. Get regular exercise Get regular exercise. This is one of the most important things you can do for your health. Most adults should:  Exercise for at least 150 minutes each week. The exercise should increase your heart rate and make you sweat (moderate-intensity exercise).  Do strengthening exercises at least twice a week. This is in addition to the moderate-intensity exercise.  Spend less time sitting. Even light physical activity can be beneficial. Watch cholesterol and blood lipids Have your blood tested for lipids and cholesterol at 77 years of age, then have this test every 5 years. Have your cholesterol levels checked more often if:  Your lipid or cholesterol levels are high.  You are older than 77 years of age.  You are at high risk for heart disease. What should I know about cancer screening? Depending on your health history and family history, you may need to have cancer screening at various ages. This may include screening for:  Breast cancer.  Cervical cancer.  Colorectal cancer.  Skin cancer.  Lung cancer. What should I know about heart disease, diabetes, and high blood  pressure? Blood pressure and heart disease  High blood pressure causes heart disease and increases the risk of stroke. This is more likely to develop in people who have high blood pressure readings, are of African descent, or are overweight.  Have your blood pressure checked: ? Every 3-5 years if you are 18-39 years of age. ? Every year if you are 40 years old or older. Diabetes Have regular diabetes screenings. This checks your fasting blood sugar level. Have the screening done:  Once every three years after age 40 if you are at a normal weight and have a low risk for diabetes.  More often and at a younger age if you are overweight or have a high risk for diabetes. What should I know about preventing infection? Hepatitis B If you have a higher risk for hepatitis B, you should be screened for this virus. Talk with your health care provider to find out if you are at risk for hepatitis B infection. Hepatitis C Testing is recommended for:  Everyone born from 1945 through 1965.  Anyone with known risk factors for hepatitis C. Sexually transmitted infections (STIs)  Get screened for STIs, including gonorrhea and chlamydia, if: ? You are sexually active and are younger than 77 years of age. ? You are older than 77 years of age and your health care provider tells you that you are at risk for this type of infection. ? Your sexual activity has changed since you were last screened, and you are at increased risk for chlamydia or gonorrhea. Ask your health care provider if   you are at risk.  Ask your health care provider about whether you are at high risk for HIV. Your health care provider may recommend a prescription medicine to help prevent HIV infection. If you choose to take medicine to prevent HIV, you should first get tested for HIV. You should then be tested every 3 months for as long as you are taking the medicine. Pregnancy  If you are about to stop having your period (premenopausal) and  you may become pregnant, seek counseling before you get pregnant.  Take 400 to 800 micrograms (mcg) of folic acid every day if you become pregnant.  Ask for birth control (contraception) if you want to prevent pregnancy. Osteoporosis and menopause Osteoporosis is a disease in which the bones lose minerals and strength with aging. This can result in bone fractures. If you are 65 years old or older, or if you are at risk for osteoporosis and fractures, ask your health care provider if you should:  Be screened for bone loss.  Take a calcium or vitamin D supplement to lower your risk of fractures.  Be given hormone replacement therapy (HRT) to treat symptoms of menopause. Follow these instructions at home: Lifestyle  Do not use any products that contain nicotine or tobacco, such as cigarettes, e-cigarettes, and chewing tobacco. If you need help quitting, ask your health care provider.  Do not use street drugs.  Do not share needles.  Ask your health care provider for help if you need support or information about quitting drugs. Alcohol use  Do not drink alcohol if: ? Your health care provider tells you not to drink. ? You are pregnant, may be pregnant, or are planning to become pregnant.  If you drink alcohol: ? Limit how much you use to 0-1 drink a day. ? Limit intake if you are breastfeeding.  Be aware of how much alcohol is in your drink. In the [place] one drink equals one 12 oz bottle of beer (355 mL), one 5 oz glass of wine (148 mL), or one 1 oz glass of hard liquor (44 mL). General instructions  Schedule regular health, dental, and eye exams.  Stay current with your vaccines.  Tell your health care provider if: ? You often feel depressed. ? You have ever been abused or do not feel safe at home. Summary  Adopting a healthy lifestyle and getting preventive care are important in promoting health and wellness.  Follow your health care provider's instructions about healthy  diet, exercising, and getting tested or screened for diseases.  Follow your health care provider's instructions on monitoring your cholesterol and blood pressure. This information is not intended to replace advice given to you by your health care provider. Make sure you discuss any questions you have with your health care provider. Document Revised: 04/23/2018 Document Reviewed: 04/23/2018 Elsevier Patient Education  2020 [employer]  

## 2020-03-24 LAB — CMP14+EGFR
ALT: 11 IU/L (ref 0–32)
AST: 19 IU/L (ref 0–40)
Albumin/Globulin Ratio: 1.3 (ref 1.2–2.2)
Albumin: 4.4 g/dL (ref 3.7–4.7)
Alkaline Phosphatase: 63 IU/L (ref 44–121)
BUN/Creatinine Ratio: 20 (ref 12–28)
BUN: 15 mg/dL (ref 8–27)
Bilirubin Total: 0.2 mg/dL (ref 0.0–1.2)
CO2: 25 mmol/L (ref 20–29)
Calcium: 9.5 mg/dL (ref 8.7–10.3)
Chloride: 102 mmol/L (ref 96–106)
Creatinine, Ser: 0.76 mg/dL (ref 0.57–1.00)
GFR calc Af Amer: 88 mL/min/{1.73_m2} (ref >59)
GFR calc non Af Amer: 76 mL/min/{1.73_m2} (ref >59)
Globulin, Total: 3.3 g/dL (ref 1.5–4.5)
Glucose: 88 mg/dL (ref 65–99)
Potassium: 3.8 mmol/L (ref 3.5–5.2)
Sodium: 144 mmol/L (ref 134–144)
Total Protein: 7.7 g/dL (ref 6.0–8.5)

## 2020-03-24 LAB — HEMOGLOBIN A1C
Est. average glucose Bld gHb Est-mCnc: 134 mg/dL
Hgb A1c MFr Bld: 6.3 % — ABNORMAL HIGH (ref 4.8–5.6)

## 2020-03-24 LAB — LIPID PANEL
Chol/HDL Ratio: 2.8 ratio (ref 0.0–4.4)
Cholesterol, Total: 144 mg/dL (ref 100–199)
HDL: 51 mg/dL (ref >39)
LDL Chol Calc (NIH): 70 mg/dL (ref 0–99)
Triglycerides: 131 mg/dL (ref 0–149)
VLDL Cholesterol Cal: 23 mg/dL (ref 5–40)

## 2020-03-24 LAB — URIC ACID: Uric Acid: 7.6 mg/dL (ref 3.1–7.9)

## 2020-03-29 ENCOUNTER — Telehealth: Payer: Self-pay

## 2020-03-29 NOTE — Telephone Encounter (Signed)
-----   Message from [name], MD sent at 03/26/2020  6:20 PM EST ----- Hba1c is 6.3, not bad! Make sure she is taking the lisinopril this is kidney protective. She has protein in urine, liver and kidney function are stable. Chol is great. Continue with current meds. Uric acid level is higher than what it should be. We need to start allopurinol 100mg  daily. Is your foot better? Please send rx #30/1 refill. See me in six weeks or so for allopurinol f/u.

## 2020-03-29 NOTE — Telephone Encounter (Signed)
I trying to call the pt with her lab results.  Her phone isn't ringing when dialed so I'm sending her a letter to contact the office.

## 2020-04-05 ENCOUNTER — Ambulatory Visit (INDEPENDENT_AMBULATORY_CARE_PROVIDER_SITE_OTHER): Payer: Medicare Other | Admitting: Nurse Practitioner

## 2020-04-05 ENCOUNTER — Other Ambulatory Visit: Payer: Self-pay | Admitting: Nurse Practitioner

## 2020-04-05 ENCOUNTER — Other Ambulatory Visit: Payer: Self-pay

## 2020-04-05 ENCOUNTER — Encounter: Payer: Self-pay | Admitting: Nurse Practitioner

## 2020-04-05 VITALS — BP 142/84 | HR 85 | Temp 97.7°F

## 2020-04-05 DIAGNOSIS — M79671 Pain in right foot: Secondary | ICD-10-CM

## 2020-04-05 DIAGNOSIS — Z23 Encounter for immunization: Secondary | ICD-10-CM

## 2020-04-05 DIAGNOSIS — Z8739 Personal history of other diseases of the musculoskeletal system and connective tissue: Secondary | ICD-10-CM

## 2020-04-05 MED ORDER — COLCHICINE 0.6 MG PO TABS: 0.6000 mg | ORAL_TABLET | Freq: Every day | ORAL | 0 refills | Status: DC

## 2020-04-05 MED ORDER — KETOROLAC TROMETHAMINE 30 MG/ML IJ SOLN
30.0000 mg | Freq: Once | INTRAMUSCULAR | Status: AC
  Administered 2020-04-05: 30 mg via INTRAMUSCULAR

## 2020-04-05 MED ORDER — ALLOPURINOL 100 MG PO TABS: 100.0000 mg | ORAL_TABLET | Freq: Every day | ORAL | 1 refills | Status: DC

## 2020-04-05 NOTE — Progress Notes (Signed)
This visit occurred during the SARS-CoV-2 public health emergency.  Safety protocols were in place, including screening questions prior to the visit, additional usage of staff PPE, and extensive cleaning of exam room while observing appropriate contact time as indicated for disinfecting solutions.  Subjective:     Patient ID: Pamela Key , female    DOB: [birth year] , 79 y.o.   MRN: [id number]   Chief Complaint  Patient presents with  . Foot Pain    HPI  Right foot pain - since yesterday evening. She took a tylenol without relief about 9 am this morning.  She is also having swelling to her right foot. She had chicken a few days ago.  She admits to not drinking a good amount of water.  She has a history of gout    Past Medical History:  Diagnosis Date  . Diabetes mellitus without complication (HCC)   . Gout   . High cholesterol   . Hypertension      Family History  Problem Relation Age of Onset  . Diabetes Mother   . Hypertension Mother   . Cancer Father   . Hypertension Father      Current Outpatient Medications:  .  allopurinol (ZYLOPRIM) 100 MG tablet, Take 1 tablet (100 mg total) by mouth daily., Disp: 30 tablet, Rfl: 1 .  cephALEXin (KEFLEX) 500 MG capsule, Take 1 capsule (500 mg total) by mouth 2 (two) times daily., Disp: 10 capsule, Rfl: 0 .  colchicine 0.6 MG tablet, Take 1 tablet (0.6 mg total) by mouth daily for 5 days., Disp: 5 tablet, Rfl: 0 .  ferrous sulfate 325 (65 FE) MG tablet, Take 325 mg by mouth See admin instructions. Take one tablet (325 mg) by mouth daily Monday thru Friday (skip Saturday and Sunday), Disp: , Rfl:  .  lisinopril (ZESTRIL) 20 MG tablet, TAKE 1 TABLET BY MOUTH EVERY DAY, Disp: 90 tablet, Rfl: 1 .  Misc Natural Products (BLOOD SUGAR BALANCE PO), Take 1 capsule by mouth daily. , Disp: , Rfl:  .  Multiple Vitamin (MULTIVITAMIN WITH MINERALS) TABS tablet, Take 1 tablet by mouth daily with breakfast., Disp: , Rfl:  .  pravastatin  (PRAVACHOL) 40 MG tablet, TAKE 1 TABLET BY MOUTH MONDAY - FRIDAY (Patient taking differently: Take 40 mg by mouth See admin instructions. Take one tablet (40 mg) by mouth daily on Monday thru Friday (skip Saturday and Sunday)), Disp: 75 tablet, Rfl: 1   No Known Allergies   Review of Systems  Constitutional: Negative.   Respiratory: Negative.   Cardiovascular: Negative.  Negative for chest pain, palpitations and leg swelling.  Musculoskeletal:       Right foot pain, difficult to walk on. Denies swelling or injury  Psychiatric/Behavioral: Negative.      Today's Vitals   04/05/20 1104  BP: (!) 142/84  Pulse: 85  Temp: 97.7 F (36.5 C)  TempSrc: Oral   There is no height or weight on file to calculate BMI.   Objective:  Physical Exam Vitals reviewed.  Constitutional:      Appearance: Normal appearance.  Cardiovascular:     Rate and Rhythm: Normal rate and regular rhythm.     Pulses: Normal pulses.     Heart sounds: Normal heart sounds. No murmur heard.   Pulmonary:     Effort: Pulmonary effort is normal. No respiratory distress.     Breath sounds: Normal breath sounds. No wheezing.  Musculoskeletal:        General: Tenderness (  right foot tenderness around dorsal surface and ankle) present. No swelling, deformity or signs of injury.     Right lower leg: No edema.     Left lower leg: No edema.  Skin:    General: Skin is warm.  Neurological:     General: No focal deficit present.     Mental Status: She is alert and oriented to person, place, and time.     Cranial Nerves: No cranial nerve deficit.     Motor: No weakness.  Psychiatric:        Mood and Affect: Mood normal.        Behavior: Behavior normal.        Thought Content: Thought content normal.        Judgment: Judgment normal.         Assessment And Plan:     1. Right foot pain  Likely gout flare, tender to touch and unable to sit still due to pain  Will treat with toradol 30 mg and she is to take  colchicine for the next 5 days  - ketorolac (TORADOL) 30 MG/ML injection 30 mg - colchicine 0.6 MG tablet; Take 1 tablet (0.6 mg total) by mouth daily for 5 days.  Dispense: 5 tablet; Refill: 0  2. History of gout - ketorolac (TORADOL) 30 MG/ML injection 30 mg - colchicine 0.6 MG tablet; Take 1 tablet (0.6 mg total) by mouth daily for 5 days.  Dispense: 5 tablet; Refill: 0  3. Need for influenza vaccination  Influenza vaccine administered  Encouraged to take Tylenol as needed for fever or muscle aches. - Flu Vaccine QUAD High Dose(Fluad)     Patient was given opportunity to ask questions. Patient verbalized understanding of the plan and was able to repeat key elements of the plan. All questions were answered to their satisfaction.    Jeanell Sparrow, FNP, have reviewed all documentation for this visit. The documentation on 04/10/20 for the exam, diagnosis, procedures, and orders are all accurate and complete.  THE PATIENT IS ENCOURAGED TO PRACTICE SOCIAL DISTANCING DUE TO THE COVID-19 PANDEMIC.

## 2020-04-05 NOTE — Progress Notes (Deleted)
Pt here today for flu shot  

## 2020-04-06 ENCOUNTER — Telehealth: Payer: Self-pay

## 2020-04-06 NOTE — Telephone Encounter (Signed)
Per JM pt is to take the colchicine for 5 days then start the allopurinol pt notified

## 2020-04-27 ENCOUNTER — Other Ambulatory Visit: Payer: Self-pay | Admitting: Internal Medicine

## 2020-05-12 ENCOUNTER — Other Ambulatory Visit: Payer: Self-pay | Admitting: Internal Medicine

## 2020-05-19 ENCOUNTER — Ambulatory Visit: Payer: Self-pay | Admitting: Internal Medicine

## 2020-05-19 ENCOUNTER — Telehealth: Payer: Self-pay

## 2020-05-19 ENCOUNTER — Ambulatory Visit: Payer: Medicare Other

## 2020-05-19 NOTE — Telephone Encounter (Signed)
This nurse called patient in regards to miossing today's appointments. She states that she forgot. Rescheduled for 06/09/2020.

## 2020-06-09 ENCOUNTER — Ambulatory Visit (INDEPENDENT_AMBULATORY_CARE_PROVIDER_SITE_OTHER): Payer: Medicare HMO

## 2020-06-09 ENCOUNTER — Other Ambulatory Visit: Payer: Self-pay

## 2020-06-09 ENCOUNTER — Encounter: Payer: Self-pay | Admitting: Internal Medicine

## 2020-06-09 ENCOUNTER — Ambulatory Visit (INDEPENDENT_AMBULATORY_CARE_PROVIDER_SITE_OTHER): Payer: Medicare HMO | Admitting: Internal Medicine

## 2020-06-09 VITALS — BP 168/74 | HR 81 | Temp 97.1°F | Ht 62.2 in | Wt 167.2 lb

## 2020-06-09 VITALS — BP 168/74 | HR 81 | Temp 98.1°F | Ht 62.0 in | Wt 167.0 lb

## 2020-06-09 DIAGNOSIS — M79671 Pain in right foot: Secondary | ICD-10-CM

## 2020-06-09 DIAGNOSIS — Z0000 Encounter for general adult medical examination without abnormal findings: Secondary | ICD-10-CM

## 2020-06-09 DIAGNOSIS — M109 Gout, unspecified: Secondary | ICD-10-CM | POA: Diagnosis not present

## 2020-06-09 DIAGNOSIS — Z6830 Body mass index (BMI) 30.0-30.9, adult: Secondary | ICD-10-CM | POA: Diagnosis not present

## 2020-06-09 DIAGNOSIS — E6609 Other obesity due to excess calories: Secondary | ICD-10-CM | POA: Diagnosis not present

## 2020-06-09 DIAGNOSIS — Z8739 Personal history of other diseases of the musculoskeletal system and connective tissue: Secondary | ICD-10-CM

## 2020-06-09 MED ORDER — TRIAMCINOLONE ACETONIDE 40 MG/ML IJ SUSP
40.0000 mg | Freq: Once | INTRAMUSCULAR | Status: AC
  Administered 2020-06-09: 40 mg via INTRAMUSCULAR

## 2020-06-09 NOTE — Patient Instructions (Signed)
Gout  Gout is painful swelling of your joints. Gout is a type of arthritis. It is caused by having too much uric acid in your body. Uric acid is a chemical that is made when your body breaks down substances called purines. If your body has too much uric acid, sharp crystals can form and build up in your joints. This causes pain and swelling. Gout attacks can happen quickly and be very painful (acute gout). Over time, the attacks can affect more joints and happen more often (chronic gout). What are the causes?  Too much uric acid in your blood. This can happen because: ? Your kidneys do not remove enough uric acid from your blood. ? Your body makes too much uric acid. ? You eat too many foods that are high in purines. These foods include organ meats, some seafood, and beer.  Trauma or stress. What increases the risk?  Having a family history of gout.  Being female and middle-aged.  Being female and having gone through menopause.  Being very overweight (obese).  Drinking alcohol, especially beer.  Not having enough water in the body (being dehydrated).  Losing weight too quickly.  Having an organ transplant.  Having lead poisoning.  Taking certain medicines.  Having kidney disease.  Having a skin condition called psoriasis. What are the signs or symptoms? An attack of acute gout usually happens in just one joint. The most common place is the big toe. Attacks often start at night. Other joints that may be affected include joints of the feet, ankle, knee, fingers, wrist, or elbow. Symptoms of an attack may include:  Very bad pain.  Warmth.  Swelling.  Stiffness.  Shiny, red, or purple skin.  Tenderness. The affected joint may be very painful to touch.  Chills and fever. Chronic gout may cause symptoms more often. More joints may be involved. You may also have white or yellow lumps (tophi) on your hands or feet or in other areas near your joints.   How is this  treated?  Treatment for this condition has two phases: treating an acute attack and preventing future attacks.  Acute gout treatment may include: ? NSAIDs. ? Steroids. These are taken by mouth or injected into a joint. ? Colchicine. This medicine relieves pain and swelling. It can be given by mouth or through an IV tube.  Preventive treatment may include: ? Taking small doses of NSAIDs or colchicine daily. ? Using a medicine that reduces uric acid levels in your blood. ? Making changes to your diet. You may need to see a food expert (dietitian) about what to eat and drink to prevent gout. Follow these instructions at home: During a gout attack  If told, put ice on the painful area: ? Put ice in a plastic bag. ? Place a towel between your skin and the bag. ? Leave the ice on for 20 minutes, 2-3 times a day.  Raise (elevate) the painful joint above the level of your heart as often as you can.  Rest the joint as much as possible. If the joint is in your leg, you may be given crutches.  Follow instructions from your doctor about what you cannot eat or drink.   Avoiding future gout attacks  Eat a low-purine diet. Avoid foods and drinks such as: ? Liver. ? Kidney. ? Anchovies. ? Asparagus. ? Herring. ? Mushrooms. ? Mussels. ? Beer.  Stay at a healthy weight. If you want to lose weight, talk with your doctor. Do   not lose weight too fast.  Start or continue an exercise plan as told by your doctor. Eating and drinking  Drink enough fluids to keep your pee (urine) pale yellow.  If you drink alcohol: ? Limit how much you use to:  0-1 drink a day for women.  0-2 drinks a day for men. ? Be aware of how much alcohol is in your drink. In the [place] one drink equals one 12 oz bottle of beer (355 mL), one 5 oz glass of wine (148 mL), or one 1 oz glass of hard liquor (44 mL). General instructions  Take over-the-counter and prescription medicines only as told by your doctor.  Do  not drive or use heavy machinery while taking prescription pain medicine.  Return to your normal activities as told by your doctor. Ask your doctor what activities are safe for you.  Keep all follow-up visits as told by your doctor. This is important. Contact a doctor if:  You have another gout attack.  You still have symptoms of a gout attack after 10 days of treatment.  You have problems (side effects) because of your medicines.  You have chills or a fever.  You have burning pain when you pee (urinate).  You have pain in your lower back or belly. Get help right away if:  You have very bad pain.  Your pain cannot be controlled.  You cannot pee. Summary  Gout is painful swelling of the joints.  The most common site of pain is the big toe, but it can affect other joints.  Medicines and avoiding some foods can help to prevent and treat gout attacks. This information is not intended to replace advice given to you by your health care provider. Make sure you discuss any questions you have with your health care provider. Document Revised: 11/20/2017 Document Reviewed: 11/20/2017 Elsevier Patient Education  2021 Elsevier Inc.  

## 2020-06-09 NOTE — Progress Notes (Signed)
I,[name],acting as a Neurosurgeon for Gwynneth Aliment, MD.,have documented all relevant documentation on the behalf of Gwynneth Aliment, MD,as directed by  Gwynneth Aliment, MD while in the presence of Gwynneth Aliment, MD.  This visit occurred during the SARS-CoV-2 public health emergency.  Safety protocols were in place, including screening questions prior to the visit, additional usage of staff PPE, and extensive cleaning of exam room while observing appropriate contact time as indicated for disinfecting solutions.  Subjective:     Patient ID: Pamela Key , female    DOB: 05/21/1940 , 80 y.o.   MRN: [id number]   Chief Complaint  Patient presents with  . Gout    HPI  The patient is here today for a gout evaluation. She c/o right foot pain, same area where she typically gets gout flares. Denies recent fall/trauma. Denies eating shellfish recently. Not sure what may have triggered her sx. There is some pain with ambulation.     Past Medical History:  Diagnosis Date  . Diabetes mellitus without complication (HCC)   . Gout   . High cholesterol   . Hypertension      Family History  Problem Relation Age of Onset  . Diabetes Mother   . Hypertension Mother   . Cancer Father   . Hypertension Father      Current Outpatient Medications:  .  allopurinol (ZYLOPRIM) 100 MG tablet, TAKE 1 TABLET BY MOUTH EVERY DAY (Patient not taking: Reported on 06/23/2020), Disp: 90 tablet, Rfl: 1 .  lisinopril (ZESTRIL) 20 MG tablet, TAKE 1 TABLET BY MOUTH EVERY DAY, Disp: 90 tablet, Rfl: 1 .  Misc Natural Products (BLOOD SUGAR BALANCE PO), Take 1 capsule by mouth daily. , Disp: , Rfl:  .  Multiple Vitamin (MULTIVITAMIN WITH MINERALS) TABS tablet, Take 1 tablet by mouth daily with breakfast., Disp: , Rfl:  .  NON FORMULARY, daily as needed. CBD capsules Pure organic hemp extract, Disp: , Rfl:  .  pravastatin (PRAVACHOL) 40 MG tablet, TAKE 1 TABLET BY MOUTH MONDAY - FRIDAY (Patient taking  differently: Take 40 mg by mouth See admin instructions. Take one tablet (40 mg) by mouth daily on Monday thru Friday (skip Saturday and Sunday)), Disp: 75 tablet, Rfl: 1 .  colchicine 0.6 MG tablet, Take 1 tablet (0.6 mg total) by mouth daily for 5 days. (Patient not taking: No sig reported), Disp: 5 tablet, Rfl: 0 .  ferrous sulfate 325 (65 FE) MG tablet, Take 325 mg by mouth See admin instructions. Take one tablet (325 mg) by mouth daily Monday thru Friday (skip Saturday and Sunday) (Patient not taking: No sig reported), Disp: , Rfl:    No Known Allergies   Review of Systems  Constitutional: Negative.   HENT: Negative.   Eyes: Negative.   Respiratory: Negative.   Cardiovascular: Negative.   Gastrointestinal: Negative.   Musculoskeletal: Positive for arthralgias.  Skin: Negative.   Psychiatric/Behavioral: Negative.   All other systems reviewed and are negative.    Today's Vitals   06/09/20 1423  BP: (!) 168/74  Pulse: 81  Temp: (!) 97.1 F (36.2 C)  TempSrc: Oral  Weight: 167 lb 3.2 oz (75.8 kg)  Height: 5' 2.2" (1.58 m)  PainSc: 8   PainLoc: Foot   Body mass index is 30.38 kg/m.  Wt Readings from Last 3 Encounters:  06/23/20 169 lb (76.7 kg)  06/09/20 167 lb (75.8 kg)  06/09/20 167 lb 3.2 oz (75.8 kg)   Objective:  Physical  Exam Vitals and nursing note reviewed.  Constitutional:      Appearance: Normal appearance. She is obese.  HENT:     Head: Normocephalic and atraumatic.     Nose:     Comments: Masked     Mouth/Throat:     Comments: Masked  Cardiovascular:     Rate and Rhythm: Normal rate and regular rhythm.     Heart sounds: Normal heart sounds.  Pulmonary:     Breath sounds: Normal breath sounds.  Musculoskeletal:     Cervical back: Normal range of motion.     Comments: Apolinar Junes is present. No vesicular lesions are noted. Area is warm to touch and exquisitely tender.   Skin:    General: Skin is warm.  Neurological:     General: No focal deficit  present.     Mental Status: She is alert and oriented to person, place, and time.         Assessment And Plan:     1. Acute gouty arthritis Comments: Her sx are likely due to gout flare. She is reminded that she must take allopurinol daily. She will resume colchicine. Advised to stay well hydrated.  - triamcinolone acetonide (KENALOG-40) injection 40 mg  2. Class 1 obesity due to excess calories with serious comorbidity and body mass index (BMI) of 30.0 to 30.9 in adult Comments: Her BMI is acceptable for her demographic. Encouraged to incorporate more exercise into her daily routine (once gout flare resolves).   Patient was given opportunity to ask questions. Patient verbalized understanding of the plan and was able to repeat key elements of the plan. All questions were answered to their satisfaction.   I, Gwynneth Aliment, MD, have reviewed all documentation for this visit. The documentation on 07/02/20 for the exam, diagnosis, procedures, and orders are all accurate and complete.  THE PATIENT IS ENCOURAGED TO PRACTICE SOCIAL DISTANCING DUE TO THE COVID-19 PANDEMIC.

## 2020-06-09 NOTE — Progress Notes (Signed)
This visit occurred during the SARS-CoV-2 public health emergency.  Safety protocols were in place, including screening questions prior to the visit, additional usage of staff PPE, and extensive cleaning of exam room while observing appropriate contact time as indicated for disinfecting solutions.  Subjective:   Pamela Key is a 80 y.o. female who presents for Medicare Annual (Subsequent) preventive examination.  Review of Systems     Cardiac Risk Factors include: advanced age (>33men, >74 women);diabetes mellitus;hypertension;obesity (BMI >30kg/m2)     Objective:    Today's Vitals   06/09/20 1439 06/09/20 1440  BP: (!) 168/74   Pulse: 81   Temp: 98.1 F (36.7 C)   TempSrc: Oral   Weight: 167 lb (75.8 kg)   Height: 5\' 2"  (1.575 m)   PainSc:  8    Body mass index is 30.54 kg/m.  Advanced Directives 06/09/2020 01/23/2020 12/31/2019 03/19/2019 02/16/2019 03/13/2018 10/10/2016  Does Patient Have a Medical Advance Directive? No No No No No No No  Would patient like information on creating a medical advance directive? No - Patient declined Yes (ED - Information included in AVS) - - Yes (MAU/Ambulatory/Procedural Areas - Information given) No - Patient declined -    Current Medications (verified) Outpatient Encounter Medications as of 06/09/2020  Medication Sig  . allopurinol (ZYLOPRIM) 100 MG tablet TAKE 1 TABLET BY MOUTH EVERY DAY  . lisinopril (ZESTRIL) 20 MG tablet TAKE 1 TABLET BY MOUTH EVERY DAY  . Misc Natural Products (BLOOD SUGAR BALANCE PO) Take 1 capsule by mouth daily.   . Multiple Vitamin (MULTIVITAMIN WITH MINERALS) TABS tablet Take 1 tablet by mouth daily with breakfast.  . NON FORMULARY daily as needed. CBD capsules Pure organic hemp extract  . pravastatin (PRAVACHOL) 40 MG tablet TAKE 1 TABLET BY MOUTH MONDAY - FRIDAY (Patient taking differently: Take 40 mg by mouth See admin instructions. Take one tablet (40 mg) by mouth daily on Monday thru Friday (skip Saturday  and Sunday))  . colchicine 0.6 MG tablet Take 1 tablet (0.6 mg total) by mouth daily for 5 days. (Patient not taking: Reported on 06/09/2020)  . ferrous sulfate 325 (65 FE) MG tablet Take 325 mg by mouth See admin instructions. Take one tablet (325 mg) by mouth daily Monday thru Friday (skip Saturday and Sunday) (Patient not taking: Reported on 06/09/2020)   No facility-administered encounter medications on file as of 06/09/2020.    Allergies (verified) Patient has no known allergies.   History: Past Medical History:  Diagnosis Date  . Diabetes mellitus without complication (HCC)   . Gout   . High cholesterol   . Hypertension    Past Surgical History:  Procedure Laterality Date  . TUBAL LIGATION  1976   Family History  Problem Relation Age of Onset  . Diabetes Mother   . Hypertension Mother   . Cancer Father   . Hypertension Father    Social History   Socioeconomic History  . Marital status: Divorced    Spouse name: Not on file  . Number of children: Not on file  . Years of education: Not on file  . Highest education level: Not on file  Occupational History  . Occupation: retired  Tobacco Use  . Smoking status: Former Smoker    Packs/day: 0.25    Types: Cigarettes    Quit date: 1976    Years since quitting: 46.1  . Smokeless tobacco: Never Used  . Tobacco comment: smoked at least 10 years, can't remember exact years  Vaping Use  . Vaping Use: Never used  Substance and Sexual Activity  . Alcohol use: Yes    Comment: on occassion  . Drug use: No  . Sexual activity: Not Currently  Other Topics Concern  . Not on file  Social History Narrative  . Not on file   Social Determinants of Health   Financial Resource Strain: Low Risk   . Difficulty of Paying Living Expenses: Not hard at all  Food Insecurity: No Food Insecurity  . Worried About Programme researcher, broadcasting/film/video in the Last Year: Never true  . Ran Out of Food in the Last Year: Never true  Transportation Needs: No  Transportation Needs  . Lack of Transportation (Medical): No  . Lack of Transportation (Non-Medical): No  Physical Activity: Sufficiently Active  . Days of Exercise per Week: 3 days  . Minutes of Exercise per Session: 60 min  Stress: No Stress Concern Present  . Feeling of Stress : Not at all  Social Connections: Not on file    Tobacco Counseling Counseling given: Not Answered Comment: smoked at least 10 years, can't remember exact years   Clinical Intake:  Pre-visit preparation completed: Yes  Pain : 0-10 Pain Score: 8  Pain Type: Chronic pain Pain Location: Foot Pain Orientation: Right Pain Descriptors / Indicators: Constant Pain Onset: More than a month ago Pain Frequency: Constant     Nutritional Status: BMI > 30  Obese Nutritional Risks: None Diabetes: Yes  How often do you need to have someone help you when you read instructions, pamphlets, or other written materials from your doctor or pharmacy?: 1 - Never What is the last grade level you completed in school?: masters degree  Diabetic? Yes Nutrition Risk Assessment:  Has the patient had any N/V/D within the last 2 months?  No  Does the patient have any non-healing wounds?  No  Has the patient had any unintentional weight loss or weight gain?  No   Diabetes:  Is the patient diabetic?  Yes  If diabetic, was a CBG obtained today?  No  Did the patient bring in their glucometer from home?  No  How often do you monitor your CBG's? Does not.   Financial Strains and Diabetes Management:  Are you having any financial strains with the device, your supplies or your medication? No .  Does the patient want to be seen by Chronic Care Management for management of their diabetes?  No  Would the patient like to be referred to a Nutritionist or for Diabetic Management?  No   Diabetic Exams:  Diabetic Eye Exam: Completed 12/10/2019 Diabetic Foot Exam: Completed 03/23/2020   Interpreter Needed?: No  Information  entered by :: NAllen LPN   Activities of Daily Living In your present state of health, do you have any difficulty performing the following activities: 06/09/2020 12/31/2019  Hearing? N N  Vision? N N  Difficulty concentrating or making decisions? N N  Walking or climbing stairs? N N  Dressing or bathing? N N  Doing errands, shopping? N N  Preparing Food and eating ? N N  Using the Toilet? N N  In the past six months, have you accidently leaked urine? N N  Do you have problems with loss of bowel control? N N  Managing your Medications? N N  Managing your Finances? N N  Housekeeping or managing your Housekeeping? N N  Some recent data might be hidden    Patient Care Team: [name], MD as  PCP - General (Internal Medicine) Little, Karma Lew, RN as Case Manager  Indicate any recent Medical Services you may have received from other than [facility] providers in the past year (date may be approximate).     Assessment:   This is a routine wellness examination for [facility].  Hearing/Vision screen  Hearing Screening   125Hz  250Hz  500Hz  1000Hz  2000Hz  3000Hz  4000Hz  6000Hz  8000Hz   Right ear:           Left ear:           Vision Screening Comments: Regular eye exams, Dr.  Dietary issues and exercise activities discussed: Current Exercise Habits: Structured exercise class, Type of exercise: yoga;treadmill (stationary bike), Time (Minutes): 60, Frequency (Times/Week): 3, Weekly Exercise (Minutes/Week): 180  Goals    . Patient Stated     03/19/2019, want to stay medically stable    . Patient Stated     12/31/2019, no goals    . Patient Stated     06/09/2020, stay as healthy as possible      Depression Screen PHQ 2/9 Scores 06/09/2020 12/31/2019 03/19/2019 02/11/2019 12/31/2018 03/13/2018  PHQ - 2 Score 0 0 0 0 0 0  PHQ- 9 Score - - 0 - - -    Fall Risk Fall Risk  06/09/2020 12/31/2019 03/19/2019 12/31/2018 03/13/2018  Falls in the past year? 0 0 0 0 -  Risk for fall due to :  Medication side effect Medication side effect Medication side effect - Medication side effect  Follow up Falls evaluation completed;Education provided;Falls prevention discussed Falls evaluation completed;Education provided;Falls prevention discussed Falls evaluation completed;Education provided;Falls prevention discussed - -    FALL RISK PREVENTION PERTAINING TO THE HOME:  Any stairs in or around the home? Yes  If so, are there any without handrails? No  Home free of loose throw rugs in walkways, pet beds, electrical cords, etc? Yes  Adequate lighting in your home to reduce risk of falls? Yes   ASSISTIVE DEVICES UTILIZED TO PREVENT FALLS:  Life alert? No  Use of a cane, walker or w/c? Yes  Grab bars in the bathroom? Yes  Shower chair or bench in shower? No  Elevated toilet seat or a handicapped toilet? No   TIMED UP AND GO:  Was the test performed? No .    Gait slow and steady with assistive device  Cognitive Function:     6CIT Screen 06/09/2020 12/31/2019 06/17/2019 03/19/2019 03/13/2018  What Year? 0 points 0 points 0 points 0 points 0 points  What month? 3 points 0 points 0 points 0 points 0 points  What time? 0 points 0 points - 0 points 0 points  Count back from 20 0 points 0 points 0 points 0 points 0 points  Months in reverse 0 points 0 points 0 points 0 points 0 points  Repeat phrase 10 points 10 points 8 points 6 points 0 points  Total Score 13 10 - 6 0    Immunizations Immunization History  Administered Date(s) Administered  . Fluad Quad(high Dose 65+) 04/05/2020  . Influenza, High Dose Seasonal PF 12/31/2018  . Moderna Sars-Covid-2 Vaccination 09/18/2019, 10/12/2019  . Pneumococcal Polysaccharide-23 05/29/2010    TDAP status: Up to date  Flu Vaccine status: Up to date  Pneumococcal vaccine status: Declined,  Education has been provided regarding the importance of this vaccine but patient still declined. Advised may receive this vaccine at local pharmacy or  Health Dept. Aware to provide a copy of the vaccination record if  obtained from local pharmacy or Health Dept. Verbalized acceptance and understanding.   Covid-19 vaccine status: Completed vaccines  Qualifies for Shingles Vaccine? Yes   Zostavax completed No   Shingrix Completed?: No.    Education has been provided regarding the importance of this vaccine. Patient has been advised to call insurance company to determine out of pocket expense if they have not yet received this vaccine. Advised may also receive vaccine at local pharmacy or Health Dept. Verbalized acceptance and understanding.  Screening Tests Health Maintenance  Topic Date Due  . COVID-19 Vaccine (3 - Booster for Moderna series) 04/12/2020  . PNA vac Low Risk Adult (2 of 2 - PCV13) 12/30/2020 (Originally 05/30/2011)  . HEMOGLOBIN A1C  09/20/2020  . OPHTHALMOLOGY EXAM  12/09/2020  . FOOT EXAM  03/23/2021  . TETANUS/TDAP  02/10/2023  . INFLUENZA VACCINE  Completed  . DEXA SCAN  Completed  . Hepatitis C Screening  Completed    Health Maintenance  Health Maintenance Due  Topic Date Due  . COVID-19 Vaccine (3 - Booster for Moderna series) 04/12/2020    Colorectal cancer screening: No longer required.   Mammogram status: No longer required due to age.  Bone Density status: Completed 03/13/2017. Results reflect: Bone density results: NORMAL. Repeat every 0 years.  Lung Cancer Screening: (Low Dose CT Chest recommended if Age 60-80 years, 30 pack-year currently smoking OR have quit w/in 15years.) does not qualify.   Lung Cancer Screening Referral: no  Additional Screening:  Hepatitis C Screening: does qualify; Completed 10/26/2019  Vision Screening: Recommended annual ophthalmology exams for early detection of glaucoma and other disorders of the eye. Is the patient up to date with their annual eye exam?  Yes  Who is the provider or what is the name of the office in which the patient attends annual eye exams? Dr.  Dione Booze If pt is not established with a provider, would they like to be referred to a provider to establish care? No .   Dental Screening: Recommended annual dental exams for proper oral hygiene  Community Resource Referral / Chronic Care Management: CRR required this visit?  No   CCM required this visit?  No      Plan:     I have personally reviewed and noted the following in the patient's chart:   . Medical and social history . Use of alcohol, tobacco or illicit drugs  . Current medications and supplements . Functional ability and status . Nutritional status . Physical activity . Advanced directives . List of other physicians . Hospitalizations, surgeries, and ER visits in previous 12 months . Vitals . Screenings to include cognitive, depression, and falls . Referrals and appointments  In addition, I have reviewed and discussed with patient certain preventive protocols, quality metrics, and best practice recommendations. A written personalized care plan for preventive services as well as general preventive health recommendations were provided to patient.     Barb Merino, LPN   [id number]   Nurse Notes:

## 2020-06-09 NOTE — Patient Instructions (Signed)
Pamela Key , Thank you for taking time to come for your Medicare Wellness Visit. I appreciate your ongoing commitment to your health goals. Please review the following plan we discussed and let me know if I can assist you in the future.   Screening recommendations/referrals: Colonoscopy: not required Mammogram: not required Bone Density: completed 03/13/2017 Recommended yearly ophthalmology/optometry visit for glaucoma screening and checkup Recommended yearly dental visit for hygiene and checkup  Vaccinations: Influenza vaccine: completed 04/05/2020 Pneumococcal vaccine: decline Tdap vaccine: completed 02/09/2013, due 02/10/2023 Shingles vaccine:  discussed   Covid-19: 10/12/2019, 09/18/2019  Advanced directives: Advance directive discussed with you today. Even though you declined this today please call our office should you change your mind and we can give you the proper paperwork for you to fill out.  Conditions/risks identified: none  Next appointment: Follow up in one year for your annual wellness visit    Preventive Care 65 Years and Older, Female Preventive care refers to lifestyle choices and visits with your health care provider that can promote health and wellness. What does preventive care include?  A yearly physical exam. This is also called an annual well check.  Dental exams once or twice a year.  Routine eye exams. Ask your health care provider how often you should have your eyes checked.  Personal lifestyle choices, including:  Daily care of your teeth and gums.  Regular physical activity.  Eating a healthy diet.  Avoiding tobacco and drug use.  Limiting alcohol use.  Practicing safe sex.  Taking low-dose aspirin every day.  Taking vitamin and mineral supplements as recommended by your health care provider. What happens during an annual well check? The services and screenings done by your health care provider during your annual well check will depend on  your age, overall health, lifestyle risk factors, and family history of disease. Counseling  Your health care provider may ask you questions about your:  Alcohol use.  Tobacco use.  Drug use.  Emotional well-being.  Home and relationship well-being.  Sexual activity.  Eating habits.  History of falls.  Memory and ability to understand (cognition).  Work and work Astronomer.  Reproductive health. Screening  You may have the following tests or measurements:  Height, weight, and BMI.  Blood pressure.  Lipid and cholesterol levels. These may be checked every 5 years, or more frequently if you are over 32 years old.  Skin check.  Lung cancer screening. You may have this screening every year starting at age 50 if you have a 30-pack-year history of smoking and currently smoke or have quit within the past 15 years.  Fecal occult blood test (FOBT) of the stool. You may have this test every year starting at age 39.  Flexible sigmoidoscopy or colonoscopy. You may have a sigmoidoscopy every 5 years or a colonoscopy every 10 years starting at age 30.  Hepatitis C blood test.  Hepatitis B blood test.  Sexually transmitted disease (STD) testing.  Diabetes screening. This is done by checking your blood sugar (glucose) after you have not eaten for a while (fasting). You may have this done every 1-3 years.  Bone density scan. This is done to screen for osteoporosis. You may have this done starting at age 72.  Mammogram. This may be done every 1-2 years. Talk to your health care provider about how often you should have regular mammograms. Talk with your health care provider about your test results, treatment options, and if necessary, the need for more tests. Vaccines  Your health care provider may recommend certain vaccines, such as:  Influenza vaccine. This is recommended every year.  Tetanus, diphtheria, and acellular pertussis (Tdap, Td) vaccine. You may need a Td booster  every 10 years.  Zoster vaccine. You may need this after age 31.  Pneumococcal 13-valent conjugate (PCV13) vaccine. One dose is recommended after age 47.  Pneumococcal polysaccharide (PPSV23) vaccine. One dose is recommended after age 60. Talk to your health care provider about which screenings and vaccines you need and how often you need them. This information is not intended to replace advice given to you by your health care provider. Make sure you discuss any questions you have with your health care provider. Document Released: 05/27/2015 Document Revised: 01/18/2016 Document Reviewed: 03/01/2015 Elsevier Interactive Patient Education  2017 [place] Prevention in the Home Falls can cause injuries. They can happen to people of all ages. There are many things you can do to make your home safe and to help prevent falls. What can I do on the outside of my home?  Regularly fix the edges of walkways and driveways and fix any cracks.  Remove anything that might make you trip as you walk through a door, such as a raised step or threshold.  Trim any bushes or trees on the path to your home.  Use bright outdoor lighting.  Clear any walking paths of anything that might make someone trip, such as rocks or tools.  Regularly check to see if handrails are loose or broken. Make sure that both sides of any steps have handrails.  Any raised decks and porches should have guardrails on the edges.  Have any leaves, snow, or ice cleared regularly.  Use sand or salt on walking paths during winter.  Clean up any spills in your garage right away. This includes oil or grease spills. What can I do in the bathroom?  Use night lights.  Install grab bars by the toilet and in the tub and shower. Do not use towel bars as grab bars.  Use non-skid mats or decals in the tub or shower.  If you need to sit down in the shower, use a plastic, non-slip stool.  Keep the floor dry. Clean up any  water that spills on the floor as soon as it happens.  Remove soap buildup in the tub or shower regularly.  Attach bath mats securely with double-sided non-slip rug tape.  Do not have throw rugs and other things on the floor that can make you trip. What can I do in the bedroom?  Use night lights.  Make sure that you have a light by your bed that is easy to reach.  Do not use any sheets or blankets that are too big for your bed. They should not hang down onto the floor.  Have a firm chair that has side arms. You can use this for support while you get dressed.  Do not have throw rugs and other things on the floor that can make you trip. What can I do in the kitchen?  Clean up any spills right away.  Avoid walking on wet floors.  Keep items that you use a lot in easy-to-reach places.  If you need to reach something above you, use a strong step stool that has a grab bar.  Keep electrical cords out of the way.  Do not use floor polish or wax that makes floors slippery. If you must use wax, use non-skid floor wax.  Do  not have throw rugs and other things on the floor that can make you trip. What can I do with my stairs?  Do not leave any items on the stairs.  Make sure that there are handrails on both sides of the stairs and use them. Fix handrails that are broken or loose. Make sure that handrails are as long as the stairways.  Check any carpeting to make sure that it is firmly attached to the stairs. Fix any carpet that is loose or worn.  Avoid having throw rugs at the top or bottom of the stairs. If you do have throw rugs, attach them to the floor with carpet tape.  Make sure that you have a light switch at the top of the stairs and the bottom of the stairs. If you do not have them, ask someone to add them for you. What else can I do to help prevent falls?  Wear shoes that:  Do not have high heels.  Have rubber bottoms.  Are comfortable and fit you well.  Are closed  at the toe. Do not wear sandals.  If you use a stepladder:  Make sure that it is fully opened. Do not climb a closed stepladder.  Make sure that both sides of the stepladder are locked into place.  Ask someone to hold it for you, if possible.  Clearly mark and make sure that you can see:  Any grab bars or handrails.  First and last steps.  Where the edge of each step is.  Use tools that help you move around (mobility aids) if they are needed. These include:  Canes.  Walkers.  Scooters.  Crutches.  Turn on the lights when you go into a dark area. Replace any light bulbs as soon as they burn out.  Set up your furniture so you have a clear path. Avoid moving your furniture around.  If any of your floors are uneven, fix them.  If there are any pets around you, be aware of where they are.  Review your medicines with your doctor. Some medicines can make you feel dizzy. This can increase your chance of falling. Ask your doctor what other things that you can do to help prevent falls. This information is not intended to replace advice given to you by your health care provider. Make sure you discuss any questions you have with your health care provider. Document Released: 02/24/2009 Document Revised: 10/06/2015 Document Reviewed: 06/04/2014 Elsevier Interactive Patient Education  2017 [employer].

## 2020-06-23 ENCOUNTER — Other Ambulatory Visit: Payer: Self-pay

## 2020-06-23 ENCOUNTER — Ambulatory Visit (INDEPENDENT_AMBULATORY_CARE_PROVIDER_SITE_OTHER): Payer: Medicare Other | Admitting: Internal Medicine

## 2020-06-23 ENCOUNTER — Encounter: Payer: Self-pay | Admitting: Internal Medicine

## 2020-06-23 VITALS — BP 138/74 | HR 67 | Temp 98.1°F | Ht 62.0 in | Wt 169.0 lb

## 2020-06-23 DIAGNOSIS — I129 Hypertensive chronic kidney disease with stage 1 through stage 4 chronic kidney disease, or unspecified chronic kidney disease: Secondary | ICD-10-CM | POA: Diagnosis not present

## 2020-06-23 DIAGNOSIS — Z6830 Body mass index (BMI) 30.0-30.9, adult: Secondary | ICD-10-CM

## 2020-06-23 DIAGNOSIS — E1122 Type 2 diabetes mellitus with diabetic chronic kidney disease: Secondary | ICD-10-CM | POA: Diagnosis not present

## 2020-06-23 DIAGNOSIS — M1A3720 Chronic gout due to renal impairment, left ankle and foot, without tophus (tophi): Secondary | ICD-10-CM

## 2020-06-23 DIAGNOSIS — N182 Chronic kidney disease, stage 2 (mild): Secondary | ICD-10-CM

## 2020-06-23 DIAGNOSIS — E6609 Other obesity due to excess calories: Secondary | ICD-10-CM

## 2020-06-23 NOTE — Progress Notes (Signed)
I,Pamela Key,acting as a Education administrator for Pamela Greenland, MD.,have documented all relevant documentation on the behalf of Pamela Greenland, MD,as directed by  Pamela Greenland, MD while in the presence of Pamela Greenland, MD.  This visit occurred during the SARS-CoV-2 public health emergency.  Safety protocols were in place, including screening questions prior to the visit, additional usage of staff PPE, and extensive cleaning of exam room while observing appropriate contact time as indicated for disinfecting solutions.  Subjective:     Patient ID: Pamela Key , female    DOB: November 23, 1940 , 80 y.o.   MRN: [id number]   Chief Complaint  Patient presents with  . Diabetes  . Hypertension    HPI  She presents today for diabetes/BP check. She reports compliance with meds. She denies headaches, chest pain and palpitations.   Diabetes She presents for her follow-up diabetic visit. She has type 2 diabetes mellitus. Her disease course has been stable. There are no hypoglycemic associated symptoms. There are no diabetic associated symptoms. Pertinent negatives for diabetes include no blurred vision and no chest pain. There are no hypoglycemic complications. There are no diabetic complications. Risk factors for coronary artery disease include diabetes mellitus, dyslipidemia and hypertension. She is following a generally healthy diet. She participates in exercise intermittently. Her home blood glucose trend is fluctuating minimally. Her breakfast blood glucose is taken between 8-9 am. Her breakfast blood glucose range is generally 90-110 mg/dl. An ACE inhibitor/angiotensin II receptor blocker is being taken. She does not see a podiatrist.Eye exam is not current.  Hypertension This is a chronic problem. The current episode started more than 1 year ago. The problem has been gradually improving since onset. The problem is controlled. Pertinent negatives include no blurred vision or chest pain. Risk  factors for coronary artery disease include diabetes mellitus, dyslipidemia, post-menopausal state, obesity and sedentary lifestyle. Past treatments include ACE inhibitors. The current treatment provides moderate improvement. Compliance problems include exercise.  Hypertensive end-organ damage includes kidney disease.     Past Medical History:  Diagnosis Date  . Diabetes mellitus without complication ([place])   . Gout   . High cholesterol   . Hypertension      Family History  Problem Relation Age of Onset  . Diabetes Mother   . Hypertension Mother   . Cancer Father   . Hypertension Father      Current Outpatient Medications:  .  lisinopril (ZESTRIL) 20 MG tablet, TAKE 1 TABLET BY MOUTH EVERY DAY, Disp: 90 tablet, Rfl: 1 .  Misc Natural Products (BLOOD SUGAR BALANCE PO), Take 1 capsule by mouth daily. , Disp: , Rfl:  .  Multiple Vitamin (MULTIVITAMIN WITH MINERALS) TABS tablet, Take 1 tablet by mouth daily with breakfast., Disp: , Rfl:  .  NON FORMULARY, daily as needed. CBD capsules Pure organic hemp extract, Disp: , Rfl:  .  pravastatin (PRAVACHOL) 40 MG tablet, TAKE 1 TABLET BY MOUTH MONDAY - FRIDAY (Patient taking differently: Take 40 mg by mouth See admin instructions. Take one tablet (40 mg) by mouth daily on Monday thru Friday (skip Saturday and Sunday)), Disp: 75 tablet, Rfl: 1 .  allopurinol (ZYLOPRIM) 100 MG tablet, TAKE 1 TABLET BY MOUTH EVERY DAY (Patient not taking: Reported on 06/23/2020), Disp: 90 tablet, Rfl: 1 .  colchicine 0.6 MG tablet, Take 1 tablet (0.6 mg total) by mouth daily for 5 days. (Patient not taking: No sig reported), Disp: 5 tablet, Rfl: 0 .  ferrous sulfate 325 (65  FE) MG tablet, Take 325 mg by mouth See admin instructions. Take one tablet (325 mg) by mouth daily Monday thru Friday (skip Saturday and Sunday) (Patient not taking: No sig reported), Disp: , Rfl:    No Known Allergies   Review of Systems  Constitutional: Negative.   Eyes: Negative for blurred  vision.  Respiratory: Negative.   Cardiovascular: Negative.  Negative for chest pain.  Gastrointestinal: Negative.   Neurological: Negative.   Psychiatric/Behavioral: Negative.      Today's Vitals   06/23/20 1129  BP: 138/74  Pulse: 67  Temp: 98.1 F (36.7 C)  TempSrc: Oral  Weight: 169 lb (76.7 kg)  Height: 5' 2"  (1.575 m)  PainSc: 0-No pain   Body mass index is 30.91 kg/m.  Wt Readings from Last 3 Encounters:  06/23/20 169 lb (76.7 kg)  06/09/20 167 lb (75.8 kg)  06/09/20 167 lb 3.2 oz (75.8 kg)   Objective:  Physical Exam Vitals and nursing note reviewed.  Constitutional:      Appearance: Normal appearance.  HENT:     Head: Normocephalic and atraumatic.     Nose:     Comments: Masked     Mouth/Throat:     Comments: Masked  Cardiovascular:     Rate and Rhythm: Normal rate and regular rhythm.     Heart sounds: Normal heart sounds.  Pulmonary:     Effort: Pulmonary effort is normal.     Breath sounds: Normal breath sounds.  Musculoskeletal:     Cervical back: Normal range of motion.  Skin:    General: Skin is warm.  Neurological:     General: No focal deficit present.     Mental Status: She is alert.  Psychiatric:        Mood and Affect: Mood normal.        Behavior: Behavior normal.         Assessment And Plan:     1. Type 2 diabetes mellitus with stage 2 chronic kidney disease, without long-term current use of insulin (HCC) Comments: Chronic, I will check labs as listed below. Importance of dietary compliance was discussed with the patient.  - BMP8+EGFR - Hemoglobin A1c  2. Hypertensive nephropathy Comments: Chronic, fair control. She is aware that optimal BP is less than 130/80. Advised to follow low sodium diet.   3. 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 Encouraged to stay well hydrated and avoid triggers. I will check uric acid levels.  - Uric acid  4. Class 1 obesity due to excess  calories with serious comorbidity and body mass index (BMI) of 30.0 to 30.9 in adult Comments: HEr BMI is acceptable for her demographic. Advised to aim for at least 150 minutes of exercise per week.   Patient was given opportunity to ask questions. Patient verbalized understanding of the plan and was able to repeat key elements of the plan. All questions were answered to their satisfaction.   I, Pamela Greenland, MD, have reviewed all documentation for this visit. The documentation on 07/10/20 for the exam, diagnosis, procedures, and orders are all accurate and complete.  THE PATIENT IS ENCOURAGED TO PRACTICE SOCIAL DISTANCING DUE TO THE COVID-19 PANDEMIC.

## 2020-06-23 NOTE — Patient Instructions (Signed)
Diabetes Mellitus and Foot Care Foot care is an important part of your health, especially when you have diabetes. Diabetes may cause you to have problems because of poor blood flow (circulation) to your feet and legs, which can cause your skin to:  Become thinner and drier.  Break more easily.  Heal more slowly.  Peel and crack. You may also have nerve damage (neuropathy) in your legs and feet, causing decreased feeling in them. This means that you may not notice minor injuries to your feet that could lead to more serious problems. Noticing and addressing any potential problems early is the best way to prevent future foot problems. How to care for your feet Foot hygiene  Wash your feet daily with warm water and mild soap. Do not use hot water. Then, pat your feet and the areas between your toes until they are completely dry. Do not soak your feet as this can dry your skin.  Trim your toenails straight across. Do not dig under them or around the cuticle. File the edges of your nails with an emery board or nail file.  Apply a moisturizing lotion or petroleum jelly to the skin on your feet and to dry, brittle toenails. Use lotion that does not contain alcohol and is unscented. Do not apply lotion between your toes.   Shoes and socks  Wear clean socks or stockings every day. Make sure they are not too tight. Do not wear knee-high stockings since they may decrease blood flow to your legs.  Wear shoes that fit properly and have enough cushioning. Always look in your shoes before you put them on to be sure there are no objects inside.  To break in new shoes, wear them for just a few hours a day. This prevents injuries on your feet. Wounds, scrapes, corns, and calluses  Check your feet daily for blisters, cuts, bruises, sores, and redness. If you cannot see the bottom of your feet, use a mirror or ask someone for help.  Do not cut corns or calluses or try to remove them with medicine.  If you  find a minor scrape, cut, or break in the skin on your feet, keep it and the skin around it clean and dry. You may clean these areas with mild soap and water. Do not clean the area with peroxide, alcohol, or iodine.  If you have a wound, scrape, corn, or callus on your foot, look at it several times a day to make sure it is healing and not infected. Check for: ? Redness, swelling, or pain. ? Fluid or blood. ? Warmth. ? Pus or a bad smell.   General tips  Do not cross your legs. This may decrease blood flow to your feet.  Do not use heating pads or hot water bottles on your feet. They may burn your skin. If you have lost feeling in your feet or legs, you may not know this is happening until it is too late.  Protect your feet from hot and cold by wearing shoes, such as at the beach or on hot pavement.  Schedule a complete foot exam at least once a year (annually) or more often if you have foot problems. Report any cuts, sores, or bruises to your health care provider immediately. Where to find more information  American Diabetes Association: www.diabetes.org  Association of Diabetes Care & Education Specialists: www.diabeteseducator.org Contact a health care provider if:  You have a medical condition that increases your risk of infection and   you have any cuts, sores, or bruises on your feet.  You have an injury that is not healing.  You have redness on your legs or feet.  You feel burning or tingling in your legs or feet.  You have pain or cramps in your legs and feet.  Your legs or feet are numb.  Your feet always feel cold.  You have pain around any toenails. Get help right away if:  You have a wound, scrape, corn, or callus on your foot and: ? You have pain, swelling, or redness that gets worse. ? You have fluid or blood coming from the wound, scrape, corn, or callus. ? Your wound, scrape, corn, or callus feels warm to the touch. ? You have pus or a bad smell coming from  the wound, scrape, corn, or callus. ? You have a fever. ? You have a red line going up your leg. Summary  Check your feet every day for blisters, cuts, bruises, sores, and redness.  Apply a moisturizing lotion or petroleum jelly to the skin on your feet and to dry, brittle toenails.  Wear shoes that fit properly and have enough cushioning.  If you have foot problems, report any cuts, sores, or bruises to your health care provider immediately.  Schedule a complete foot exam at least once a year (annually) or more often if you have foot problems. This information is not intended to replace advice given to you by your health care provider. Make sure you discuss any questions you have with your health care provider. Document Revised: 11/19/2019 Document Reviewed: 11/19/2019 Elsevier Patient Education  2021 [employer] Inc.  

## 2020-06-24 LAB — BMP8+EGFR
BUN/Creatinine Ratio: 17 (ref 12–28)
BUN: 13 mg/dL (ref 8–27)
CO2: 22 mmol/L (ref 20–29)
Calcium: 8.9 mg/dL (ref 8.7–10.3)
Chloride: 105 mmol/L (ref 96–106)
Creatinine, Ser: 0.76 mg/dL (ref 0.57–1.00)
GFR calc Af Amer: 88 mL/min/{1.73_m2} (ref >59)
GFR calc non Af Amer: 76 mL/min/{1.73_m2} (ref >59)
Glucose: 94 mg/dL (ref 65–99)
Potassium: 4.7 mmol/L (ref 3.5–5.2)
Sodium: 143 mmol/L (ref 134–144)

## 2020-06-24 LAB — HEMOGLOBIN A1C
Est. average glucose Bld gHb Est-mCnc: 137 mg/dL
Hgb A1c MFr Bld: 6.4 % — ABNORMAL HIGH (ref 4.8–5.6)

## 2020-06-24 LAB — URIC ACID: Uric Acid: 4 mg/dL (ref 3.1–7.9)

## 2020-06-30 ENCOUNTER — Other Ambulatory Visit: Payer: Self-pay

## 2020-06-30 ENCOUNTER — Ambulatory Visit (INDEPENDENT_AMBULATORY_CARE_PROVIDER_SITE_OTHER): Payer: Medicare Other

## 2020-06-30 DIAGNOSIS — Z23 Encounter for immunization: Secondary | ICD-10-CM | POA: Diagnosis not present

## 2020-06-30 NOTE — Progress Notes (Signed)
   Covid-19 Vaccination Clinic  Name:  Pamela Key    MRN: [id number] DOB: [birth year]  06/30/2020  Ms. Winterhalter was observed post Covid-19 immunization for 15 minutes without incident. She was provided with Vaccine Information Sheet and instruction to access the V-Safe system.   Ms. Stepney was instructed to call 911 with any severe reactions post vaccine: [name] Difficulty breathing  . Swelling of face and throat  . A fast heartbeat  . A bad rash all over body  . Dizziness and weakness   Immunizations Administered    Name Date Dose VIS Date Route   Moderna Covid-19 Booster Vaccine 06/30/2020 10:28 AM 0.25 mL 03/02/2020 Intramuscular   Manufacturer: Moderna   Lot: [id number]   NDC: 15400-867-61

## 2020-07-01 ENCOUNTER — Ambulatory Visit: Payer: Medicare Other

## 2020-08-23 ENCOUNTER — Telehealth: Payer: Self-pay

## 2020-08-23 NOTE — Telephone Encounter (Signed)
  Chronic Care Management   Outreach Note  08/23/2020 Name: ISEBELLA UPSHUR MRN: [id number] DOB: 1941-02-11  Referred by: [name], MD Reason for referral : Care Coordination   SW received an inbound call from the patients son, [place] who reports he has noticed a significant decline in patients cognition. [name] reports the patient is forgetting to shower and has difficulty remembering if she has eaten. Provided education surrounding caregiver resources within [facility]. Obtained verbal permission to place patient on wait list for [facility] in home aid program. Advised Mr. Washington to contact DSS to apply for Medicaid on the patients behalf. Provided Mr. Washington for contact numbers to local home health agencies so he may price the cost of a private duty caregiver. Performed chart review to note upcoming appointment to see Dr. Allyne Gee in May. Advised Mr. [place] SW would communicate his concerns to the patients primary provider.  Successful outbound call placed to [employer] with [facility] in home aid program. Placed patient on wait list for in home aid program.  Follow Up Plan: No SW follow up planned at this time. SW has requested a referral for CCM program if the patients provider feels it is appropriate.  [name], BSW, CDP Social Worker, Certified Dementia Practitioner [facility] [phone]

## 2020-08-23 NOTE — Telephone Encounter (Signed)
Returned the pt's son Tobin [place]'s call about his concerns with his mother.  [name] said he has already spoken with [name] the social worker today and that the concerns will be addressed at the pt's next appointment.

## 2020-09-03 ENCOUNTER — Other Ambulatory Visit: Payer: Self-pay | Admitting: Internal Medicine

## 2020-09-26 ENCOUNTER — Ambulatory Visit: Payer: Medicare Other | Admitting: Internal Medicine

## 2020-09-26 ENCOUNTER — Ambulatory Visit (INDEPENDENT_AMBULATORY_CARE_PROVIDER_SITE_OTHER): Payer: Medicare Other | Admitting: Internal Medicine

## 2020-09-26 ENCOUNTER — Encounter: Payer: Self-pay | Admitting: Internal Medicine

## 2020-09-26 VITALS — BP 144/86 | HR 69 | Temp 98.1°F | Ht 62.0 in | Wt 178.4 lb

## 2020-09-26 DIAGNOSIS — R413 Other amnesia: Secondary | ICD-10-CM | POA: Diagnosis not present

## 2020-09-26 DIAGNOSIS — Z6832 Body mass index (BMI) 32.0-32.9, adult: Secondary | ICD-10-CM

## 2020-09-26 DIAGNOSIS — I129 Hypertensive chronic kidney disease with stage 1 through stage 4 chronic kidney disease, or unspecified chronic kidney disease: Secondary | ICD-10-CM | POA: Diagnosis not present

## 2020-09-26 DIAGNOSIS — N182 Chronic kidney disease, stage 2 (mild): Secondary | ICD-10-CM

## 2020-09-26 DIAGNOSIS — E1122 Type 2 diabetes mellitus with diabetic chronic kidney disease: Secondary | ICD-10-CM | POA: Diagnosis not present

## 2020-09-26 DIAGNOSIS — M1A3720 Chronic gout due to renal impairment, left ankle and foot, without tophus (tophi): Secondary | ICD-10-CM

## 2020-09-26 DIAGNOSIS — R410 Disorientation, unspecified: Secondary | ICD-10-CM | POA: Diagnosis not present

## 2020-09-26 DIAGNOSIS — E6609 Other obesity due to excess calories: Secondary | ICD-10-CM

## 2020-09-26 DIAGNOSIS — Z79899 Other long term (current) drug therapy: Secondary | ICD-10-CM | POA: Diagnosis not present

## 2020-09-26 MED ORDER — VALSARTAN 160 MG PO TABS: 160.0000 mg | ORAL_TABLET | Freq: Every day | ORAL | 11 refills | Status: DC

## 2020-09-26 NOTE — Patient Instructions (Addendum)
Please discontinue lisinopril. Start valsartan 160mg  daily  Diabetes Mellitus and Exercise Exercising regularly is important for overall health, especially for people who have diabetes mellitus. Exercising is not only about losing weight. It has many other health benefits, such as increasing muscle strength and bone density and reducing body fat and stress. This leads to improved fitness, flexibility, and endurance, all of which result in better overall health. What are the benefits of exercise if I have diabetes? Exercise has many benefits for people with diabetes. They include:  Helping to lower and control blood sugar (glucose).  Helping the body to respond better to the hormone insulin by improving insulin sensitivity.  Reducing how much insulin the body needs.  Lowering the risk for heart disease by: ? Lowering "bad" cholesterol and triglyceride levels. ? Increasing "good" cholesterol levels. ? Lowering blood pressure. ? Lowering blood glucose levels. What is my activity plan? Your health care provider or certified diabetes educator can help you make a plan for the type and frequency of exercise that works for you. This is called your activity plan. Be sure to:  Get at least 150 minutes of medium-intensity or high-intensity exercise each week. Exercises may include brisk walking, biking, or water aerobics.  Do stretching and strengthening exercises, such as yoga or weight lifting, at least 2 times a week.  Spread out your activity over at least 3 days of the week.  Get some form of physical activity each day. ? Do not go more than 2 days in a row without some kind of physical activity. ? Avoid being inactive for more than 90 minutes at a time. Take frequent breaks to walk or stretch.  Choose exercises or activities that you enjoy. Set realistic goals.  Start slowly and gradually increase your exercise intensity over time.   How do I manage my diabetes during exercise? Monitor  your blood glucose  Check your blood glucose before and after exercising. If your blood glucose is: ? 240 mg/dL ( mmol/L) or higher before you exercise, check your urine for ketones. These are chemicals created by the liver. If you have ketones in your urine, do not exercise until your blood glucose returns to normal. ? 100 mg/dL (5.6 mmol/L) or lower, eat a snack containing 15-20 grams of carbohydrate. Check your blood glucose 15 minutes after the snack to make sure that your glucose level is above 100 mg/dL (5.6 mmol/L) before you start your exercise.  Know the symptoms of low blood glucose (hypoglycemia) and how to treat it. Your risk for hypoglycemia increases during and after exercise. Follow these tips and your health care provider's instructions  Keep a carbohydrate snack that is fast-acting for use before, during, and after exercise to help prevent or treat hypoglycemia.  Avoid injecting insulin into areas of the body that are going to be exercised. For example, avoid injecting insulin into: ? Your arms, when you are about to play tennis. ? Your legs, when you are about to go jogging.  Keep records of your exercise habits. Doing this can help you and your health care provider adjust your diabetes management plan as needed. Write down: ? Food that you eat before and after you exercise. ? Blood glucose levels before and after you exercise. ? The type and amount of exercise you have done.  Work with your health care provider when you start a new exercise or activity. He or she may need to: ? Make sure that the activity is safe for you. ?  Adjust your insulin, other medicines, and food that you eat.  Drink plenty of water while you exercise. This prevents loss of water (dehydration) and problems caused by a lot of heat in the body (heat stroke).   Where to find more information  American Diabetes Association: www.diabetes.org Summary  Exercising regularly is important for overall  health, especially for people who have diabetes mellitus.  Exercising has many health benefits. It increases muscle strength and bone density and reduces body fat and stress. It also lowers and controls blood glucose.  Your health care provider or certified diabetes educator can help you make an activity plan for the type and frequency of exercise that works for you.  Work with your health care provider to make sure any new activity is safe for you. Also work with your health care provider to adjust your insulin, other medicines, and the food you eat. This information is not intended to replace advice given to you by your health care provider. Make sure you discuss any questions you have with your health care provider. Document Revised: 01/26/2019 Document Reviewed: 01/26/2019 Elsevier Patient Education  2021 [employer].

## 2020-09-26 NOTE — Progress Notes (Signed)
I,[name],acting as a Education administrator for [name], MD.,have documented all relevant documentation on the behalf of [name], MD,as directed by  [name], MD while in the presence of [name], MD.  This visit occurred during the SARS-CoV-2 public health emergency.  Safety protocols were in place, including screening questions prior to the visit, additional usage of staff PPE, and extensive cleaning of exam room while observing appropriate contact time as indicated for disinfecting solutions.  Subjective:     Patient ID: Pamela Key , female    DOB: 02/08/1941 , 80 y.o.   MRN: [id number]   Chief Complaint  Patient presents with  . Diabetes  . Hypertension    HPI  She presents today for diabetes/BP check. Her appt was originally scheduled for tomorrow, but staff states she came in today - so I was able to work her into the schedule. She reports she rode the bus to get here (city bus). She is ambulatory with a cane. Pt advised I will check on SCAT application, this was completed for her in 2021. She is reminded that she should use this form of transportation because this is safer. States she is now living with her grandson because her children are getting her house ready for her. She states it had to be cleaned out and now it is getting painted.   Diabetes She presents for her follow-up diabetic visit. She has type 2 diabetes mellitus. Her disease course has been stable. There are no hypoglycemic associated symptoms. There are no diabetic associated symptoms. Pertinent negatives for diabetes include no blurred vision and no chest pain. There are no hypoglycemic complications. There are no diabetic complications. Risk factors for coronary artery disease include diabetes mellitus, dyslipidemia and hypertension. She is following a generally healthy diet. She participates in exercise intermittently. Her home blood glucose trend is fluctuating minimally. Her breakfast  blood glucose is taken between 8-9 am. Her breakfast blood glucose range is generally 90-110 mg/dl. An ACE inhibitor/angiotensin II receptor blocker is being taken. She does not see a podiatrist.Eye exam is not current.  Hypertension This is a chronic problem. The current episode started more than 1 year ago. The problem has been gradually improving since onset. The problem is controlled. Pertinent negatives include no blurred vision or chest pain. Risk factors for coronary artery disease include diabetes mellitus, dyslipidemia, post-menopausal state, obesity and sedentary lifestyle. Past treatments include ACE inhibitors. The current treatment provides moderate improvement. Compliance problems include exercise.  Hypertensive end-organ damage includes kidney disease.     Past Medical History:  Diagnosis Date  . Diabetes mellitus without complication ([place])   . Gout   . High cholesterol   . Hypertension      Family History  Problem Relation Age of Onset  . Diabetes Mother   . Hypertension Mother   . Cancer Father   . Hypertension Father      Current Outpatient Medications:  .  allopurinol (ZYLOPRIM) 100 MG tablet, TAKE 1 TABLET BY MOUTH EVERY DAY, Disp: 90 tablet, Rfl: 1 .  ferrous sulfate 325 (65 FE) MG tablet, Take 325 mg by mouth See admin instructions. Take one tablet (325 mg) by mouth daily Monday thru Friday (skip Saturday and Sunday), Disp: , Rfl:  .  Misc Natural Products (BLOOD SUGAR BALANCE PO), Take 1 capsule by mouth daily. , Disp: , Rfl:  .  Multiple Vitamin (MULTIVITAMIN WITH MINERALS) TABS tablet, Take 1 tablet by mouth daily with breakfast.,  Disp: , Rfl:  .  NON FORMULARY, daily as needed. CBD capsules Pure organic hemp extract, Disp: , Rfl:  .  pravastatin (PRAVACHOL) 40 MG tablet, TAKE 1 TABLET BY MOUTH MONDAY - FRIDAY, Disp: 75 tablet, Rfl: 1 .  valsartan (DIOVAN) 160 MG tablet, Take 1 tablet (160 mg total) by mouth daily., Disp: 30 tablet, Rfl: 11 .  colchicine 0.6 MG  tablet, Take 1 tablet (0.6 mg total) by mouth daily for 5 days. (Patient not taking: No sig reported), Disp: 5 tablet, Rfl: 0   No Known Allergies   Review of Systems  Constitutional: Negative.   Eyes: Negative for blurred vision.  Respiratory: Negative.   Cardiovascular: Negative.  Negative for chest pain.  Gastrointestinal: Negative.   Psychiatric/Behavioral: Negative.   All other systems reviewed and are negative.    Today's Vitals   09/26/20 1135  BP: (!) 144/86  Pulse: 69  Temp: 98.1 F (36.7 C)  TempSrc: Oral  Weight: 178 lb 6.4 oz (80.9 kg)  Height: 5' 2"  (1.575 m)  PainSc: 0-No pain   Body mass index is 32.63 kg/m.  Wt Readings from Last 3 Encounters:  09/26/20 178 lb 6.4 oz (80.9 kg)  06/23/20 169 lb (76.7 kg)  06/09/20 167 lb (75.8 kg)   BP Readings from Last 3 Encounters:  09/26/20 (!) 144/86  06/23/20 138/74  06/09/20 (!) 168/74   Objective:  Physical Exam Vitals and nursing note reviewed.  Constitutional:      Appearance: Normal appearance. She is obese.  HENT:     Head: Normocephalic and atraumatic.     Nose:     Comments: Masked     Mouth/Throat:     Comments: Masked  Eyes:     Extraocular Movements: Extraocular movements intact.  Cardiovascular:     Rate and Rhythm: Normal rate and regular rhythm.     Heart sounds: Normal heart sounds.  Pulmonary:     Effort: Pulmonary effort is normal.     Breath sounds: Normal breath sounds.  Musculoskeletal:     Cervical back: Normal range of motion.  Skin:    General: Skin is warm.  Neurological:     General: No focal deficit present.     Mental Status: She is alert.  Psychiatric:        Mood and Affect: Mood normal.        Behavior: Behavior normal.         Assessment And Plan:     1. Type 2 diabetes mellitus with stage 2 chronic kidney disease, without long-term current use of insulin (HCC) Comments: Chronic, I will check labs as listed below. Importance of dietary compliance was discussed  with the patient.  - Hemoglobin A1c - CMP14+EGFR - AMB Referral to [facility]  2. Hypertensive nephropathy Comments: Admits to not taking meds today. Due to use of long-standing use of lisinopril, I will change her to ARB therapy. I will start her on valsartan 141m. I am not sure she is taking meds as prescribed. I will consult [facility]ewisfor nursing eval/med management.  - AMB Referral to [facility] 3. 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 I will check uric acid level today.  - Uric acid - AMB Referral to [facility] 4. Memory deficit Comments: Again, I do think SCAT app was processed last year. CCM-SW contacted and KTillie Rungdid confirm that this was  processed last year. Pt was again given the number to contact SCAT TWO days prior to her appointments. Unfortunately, pt did not keep Neuro referral last year. I will place another referral. I will check vitamin B12, RPR and TSH. I will also refer her for home health evaluation.   5. Class 1 obesity due to excess calories with serious comorbidity and body mass index (BMI) of 32.0 to 32.9 in adult  She is encouraged to strive for BMI less than 30 to decrease cardiac risk. Advised to aim for at least 150 minutes of exercise per week.  Patient was given opportunity to ask questions. Patient verbalized understanding of the plan and was able to repeat key elements of the plan. All questions were answered to their satisfaction.   I, [name], MD, have reviewed all documentation for this visit. The documentation on 09/26/20 for the exam, diagnosis, procedures, and orders are all accurate and complete.   IF YOU HAVE BEEN REFERRED TO A SPECIALIST, IT MAY TAKE 1-2 WEEKS TO SCHEDULE/PROCESS THE REFERRAL. IF YOU HAVE NOT HEARD FROM US/SPECIALIST IN TWO WEEKS, PLEASE GIVE [place] A CALL AT [phone] X [phone].   THE  PATIENT IS ENCOURAGED TO PRACTICE SOCIAL DISTANCING DUE TO THE COVID-19 PANDEMIC.

## 2020-09-27 ENCOUNTER — Ambulatory Visit: Payer: Medicare Other | Admitting: Internal Medicine

## 2020-09-27 ENCOUNTER — Encounter: Payer: Self-pay | Admitting: Internal Medicine

## 2020-09-27 ENCOUNTER — Telehealth (INDEPENDENT_AMBULATORY_CARE_PROVIDER_SITE_OTHER): Payer: Medicare Other | Admitting: Internal Medicine

## 2020-09-27 ENCOUNTER — Telehealth: Payer: Self-pay | Admitting: Internal Medicine

## 2020-09-27 VITALS — Ht 62.0 in

## 2020-09-27 DIAGNOSIS — I129 Hypertensive chronic kidney disease with stage 1 through stage 4 chronic kidney disease, or unspecified chronic kidney disease: Secondary | ICD-10-CM

## 2020-09-27 DIAGNOSIS — R413 Other amnesia: Secondary | ICD-10-CM

## 2020-09-27 DIAGNOSIS — N182 Chronic kidney disease, stage 2 (mild): Secondary | ICD-10-CM | POA: Diagnosis not present

## 2020-09-27 LAB — CMP14+EGFR
ALT: 15 IU/L (ref 0–32)
AST: 22 IU/L (ref 0–40)
Albumin/Globulin Ratio: 1.6 (ref 1.2–2.2)
Albumin: 4.3 g/dL (ref 3.7–4.7)
Alkaline Phosphatase: 56 IU/L (ref 44–121)
BUN/Creatinine Ratio: 21 (ref 12–28)
BUN: 15 mg/dL (ref 8–27)
Bilirubin Total: 0.3 mg/dL (ref 0.0–1.2)
CO2: 25 mmol/L (ref 20–29)
Calcium: 9.3 mg/dL (ref 8.7–10.3)
Chloride: 105 mmol/L (ref 96–106)
Creatinine, Ser: 0.71 mg/dL (ref 0.57–1.00)
Globulin, Total: 2.7 g/dL (ref 1.5–4.5)
Glucose: 92 mg/dL (ref 65–99)
Potassium: 4.8 mmol/L (ref 3.5–5.2)
Sodium: 142 mmol/L (ref 134–144)
Total Protein: 7 g/dL (ref 6.0–8.5)
eGFR: 88 mL/min/{1.73_m2} (ref >59)

## 2020-09-27 LAB — HEMOGLOBIN A1C
Est. average glucose Bld gHb Est-mCnc: 131 mg/dL
Hgb A1c MFr Bld: 6.2 % — ABNORMAL HIGH (ref 4.8–5.6)

## 2020-09-27 LAB — URIC ACID: Uric Acid: 5.2 mg/dL (ref 3.1–7.9)

## 2020-09-27 NOTE — Progress Notes (Signed)
Virtual Visit via Video   This visit type was conducted due to national recommendations for restrictions regarding the COVID-19 Pandemic (e.g. social distancing) in an effort to limit this patient's exposure and mitigate transmission in our community.  Due to her co-morbid illnesses, this patient is at least at moderate risk for complications without adequate follow up.  This format is felt to be most appropriate for this patient at this time.  All issues noted in this document were discussed and addressed.  A limited physical exam was performed with this format.    This visit type was conducted due to national recommendations for restrictions regarding the COVID-19 Pandemic (e.g. social distancing) in an effort to limit this patient's exposure and mitigate transmission in our community.  Patients identity confirmed using two different identifiers.  This format is felt to be most appropriate for this patient at this time.  All issues noted in this document were discussed and addressed.  No physical exam was performed (except for noted visual exam findings with Video Visits).    Date:  10/09/2020   ID:  Pamela Key, DOB 01-May-1941, MRN [id number]  Patient Location:  Home, accompanied by her daughter, Pamela Key Visit started at 3:11 - there was difficulty in connecting by the pt's family  Provider location:   Office  Chief Complaint:  "We want to have further evaluation"  History of Present Illness:    Pamela Key is a 80 y.o. female who presents via video conferencing for a telehealth visit today.  She is present with her daughter, who has some concerns. She flew into town to come to appointment in person today; however, pt mistakenly came yesterday for appt. Therefore, we decided to have virtual visit today. Also, pt's son Pamela Key is participating in the visit by speakerphone. He states pt is now living w/ her grandson so her house can be cleaned. Apparently, it was pest infested and  needed new paint, etc. He would like for patient to have home health aide/personal care services if possible.   The patient does not have symptoms concerning for COVID-19 infection (fever, chills, cough, or new shortness of breath).    Past Medical History:  Diagnosis Date  . Diabetes mellitus without complication (HCC)   . Gout   . High cholesterol   . Hypertension    Past Surgical History:  Procedure Laterality Date  . TUBAL LIGATION  1976     Current Meds  Medication Sig  . allopurinol (ZYLOPRIM) 100 MG tablet TAKE 1 TABLET BY MOUTH EVERY DAY  . ferrous sulfate 325 (65 FE) MG tablet Take 325 mg by mouth See admin instructions. Take one tablet (325 mg) by mouth daily Monday thru Friday (skip Saturday and Sunday)  . Misc Natural Products (BLOOD SUGAR BALANCE PO) Take 1 capsule by mouth daily.   . Multiple Vitamin (MULTIVITAMIN WITH MINERALS) TABS tablet Take 1 tablet by mouth daily with breakfast.  . NON FORMULARY daily as needed. CBD capsules Pure organic hemp extract  . pravastatin (PRAVACHOL) 40 MG tablet TAKE 1 TABLET BY MOUTH MONDAY - FRIDAY  . valsartan (DIOVAN) 160 MG tablet Take 1 tablet (160 mg total) by mouth daily.     Allergies:   Patient has no known allergies.   Social History   Tobacco Use  . Smoking status: Former Smoker    Packs/day: 0.75    Years: 15.00    Pack years: 11.25    Types: Cigarettes    Start date: 1961  Quit date: 51    Years since quitting: 46.4  . Smokeless tobacco: Never Used  . Tobacco comment: smoked at least 10 years, can't remember exact years  Vaping Use  . Vaping Use: Never used  Substance Use Topics  . Alcohol use: Yes    Comment: on occassion  . Drug use: No     Family Hx: The patient's family history includes Cancer in her father; Diabetes in her mother; Hypertension in her father and mother.  ROS:   Please see the history of present illness.    Review of Systems  Constitutional: Negative.   Respiratory:  Negative.   Cardiovascular: Negative.   Gastrointestinal: Negative.   Neurological: Negative.   Psychiatric/Behavioral: Negative.     All other systems reviewed and are negative.   Labs/Other Tests and Data Reviewed:    Recent Labs: 01/23/2020: Hemoglobin 12.5; Platelets 181 09/26/2020: ALT 15; BUN 15; Creatinine, Ser 0.71; Potassium 4.8; Sodium 142   Recent Lipid Panel Lab Results  Component Value Date/Time   CHOL 144 03/23/2020 04:13 PM   TRIG 131 03/23/2020 04:13 PM   HDL 51 03/23/2020 04:13 PM   CHOLHDL 2.8 03/23/2020 04:13 PM   LDLCALC 70 03/23/2020 04:13 PM    Wt Readings from Last 3 Encounters:  09/26/20 178 lb 6.4 oz (80.9 kg)  06/23/20 169 lb (76.7 kg)  06/09/20 167 lb (75.8 kg)    BP Readings from Last 3 Encounters:  09/26/20 (!) 144/86  06/23/20 138/74  06/09/20 (!) 168/74    Exam:    Vital Signs:  Ht 5\' 2"  (1.575 m)   BMI 32.63 kg/m     Physical Exam Vitals and nursing note reviewed.  HENT:     Head: Normocephalic and atraumatic.  Pulmonary:     Effort: Pulmonary effort is normal.  Neurological:     Mental Status: She is alert and oriented to person, place, and time.  Psychiatric:        Mood and Affect: Affect normal.     ASSESSMENT & PLAN:    1. Memory deficit Comments: Neuro referral placed at yesterday's visit, as well as HH referral. I advised her children to call with any other concerns that arise.  2. Hypertensive nephropathy Comments: Chronic. Will place [facility] referral Will consult w/ Nursing for med adherence.   3. Stage 2 chronic kidney disease Comments: Chronic. She was reminded of importance for adequate hydration.     COVID-19 Education: The signs and symptoms of COVID-19 were discussed with the patient and how to seek care for testing (follow up with PCP or arrange E-visit).  The importance of social distancing was discussed today.  Patient Risk:   After full review of this patients clinical status, I feel that they are at  least moderate risk at this time.  Time:   Today, I have spent 20 minutes/ seconds with the patient with telehealth technology discussing above diagnoses.     Medication Adjustments/Labs and Tests Ordered: Current medicines are reviewed at length with the patient today.  Concerns regarding medicines are outlined above.   Tests Ordered: No orders of the defined types were placed in this encounter.   Medication Changes: No orders of the defined types were placed in this encounter.   Disposition:  Follow up prn  Signed, [facility], MD

## 2020-09-27 NOTE — Telephone Encounter (Signed)
Daughter, [name], requested virtual visit to discuss her concerns. Pt had scheduled appt today, but caught the bus yesterday. Therefore, patient was seen yesterday. Pt's daughter is upset because she flew into town to come with her Mother for the appt. Virtual appt was offered. Tried to reach patient multiple times, no answer. Send direct link to her cell number, no response. Called pt's home phone and daughter's cell, no answer. Left message to advise pt to call office with her concerns.   RS

## 2020-09-28 ENCOUNTER — Telehealth: Payer: Self-pay | Admitting: *Deleted

## 2020-09-28 NOTE — Chronic Care Management (AMB) (Signed)
  Chronic Care Management   Note  09/28/2020 Name: QUINNLYN HEARNS MRN: [id number] DOB: [birth year]  Pamela Key is a 80 y.o. year old female who is a primary care patient of Glendale Chard, MD. I reached out to [name] by phone today in response to a referral sent by Ms. Sharyne Peach PCP, Dr. Baird Cancer.     Ms. Moseley was given information about Chronic Care Management services today including:  1. CCM service includes personalized support from designated clinical staff supervised by her physician, including individualized plan of care and coordination with other care providers 2. 24/7 contact phone numbers for assistance for urgent and routine care needs. 3. Service will only be billed when office clinical staff spend 20 minutes or more in a month to coordinate care. 4. Only one practitioner may furnish and bill the service in a calendar month. 5. The patient may stop CCM services at any time (effective at the end of the month) by phone call to the office staff. 6. The patient will be responsible for cost sharing (co-pay) of up to 20% of the service fee (after annual deductible is met).  Patient agreed to services and verbal consent obtained.   Follow up plan: Telephone appointment with care management team member scheduled for:09/29/2020 with [facility]  Direct Dial: [phone]

## 2020-09-28 NOTE — Chronic Care Management (AMB) (Signed)
  Chronic Care Management   Outreach Note  09/28/2020 Name: Pamela Key MRN: [id number] DOB: 12/23/1940  Pamela Key is a 80 y.o. year old female who is a primary care patient of [name], MD. I reached out to Mendel Corning by phone today in response to a referral sent by Ms. Gareth Morgan PCP, Dr. Allyne Gee.     An unsuccessful telephone outreach was attempted today. The patient was referred to the case management team for assistance with care management and care coordination.   Follow Up Plan: The care management team Key reach out to the patient again over the next 7 days. If patient returns call to provider office, please advise to call Embedded Care Management Care Guide Gwenevere Ghazi at [phone].  Gwenevere Ghazi  Care Guide, Embedded Care Coordination [facility]

## 2020-09-29 ENCOUNTER — Ambulatory Visit: Payer: Self-pay

## 2020-09-29 ENCOUNTER — Ambulatory Visit (INDEPENDENT_AMBULATORY_CARE_PROVIDER_SITE_OTHER): Payer: Medicare Other

## 2020-09-29 ENCOUNTER — Telehealth: Payer: Medicare Other

## 2020-09-29 DIAGNOSIS — I129 Hypertensive chronic kidney disease with stage 1 through stage 4 chronic kidney disease, or unspecified chronic kidney disease: Secondary | ICD-10-CM

## 2020-09-29 DIAGNOSIS — R413 Other amnesia: Secondary | ICD-10-CM

## 2020-09-29 DIAGNOSIS — N182 Chronic kidney disease, stage 2 (mild): Secondary | ICD-10-CM

## 2020-09-29 DIAGNOSIS — E1122 Type 2 diabetes mellitus with diabetic chronic kidney disease: Secondary | ICD-10-CM

## 2020-09-29 NOTE — Patient Instructions (Signed)
Social Worker Visit Information  Goals we discussed today:  Goals Addressed            This Visit's Progress   . Home and Family Safety Maintained       Timeframe:  Long-Range Goal Priority:  High Start Date:  5.19.22                           Expected End Date: 8.17.22                       Next planned outreach: 5.24.22  Patient Goals/Self-Care Activities . patient will: with the help of her family  - Follow up on status of Medicaid application - Discuss options to enroll in [facility] -Contact SW as needed prior to next scheduled call -Attend neurology appointment once it is scheduled        Materials provided: Verbal education about community resources provided by phone  Ms. Morrill was given information about Chronic Care Management services today including:  1. CCM service includes personalized support from designated clinical staff supervised by her physician, including individualized plan of care and coordination with other care providers 2. 24/7 contact phone numbers for assistance for urgent and routine care needs. 3. Service will only be billed when office clinical staff spend 20 minutes or more in a month to coordinate care. 4. Only one practitioner may furnish and bill the service in a calendar month. 5. The patient may stop CCM services at any time (effective at the end of the month) by phone call to the office staff. 6. The patient will be responsible for cost sharing (co-pay) of up to 20% of the service fee (after annual deductible is met).  Patient agreed to services and verbal consent obtained.   Patient verbalizes understanding of instructions provided today and agrees to view in [place].   Follow up plan: SW will follow up with patient by phone over the next week   [name], BSW, CDP Social Worker, Certified Dementia Practitioner [place] [facility] [place] [facility] [phone]

## 2020-09-29 NOTE — Chronic Care Management (AMB) (Signed)
Chronic Care Management   CCM RN Visit Note  09/29/2020 Name: Pamela Key MRN: [id number] DOB: 18-Mar-1941  Subjective: Pamela Key is a 80 y.o. year old female who is a primary care patient of Pamela Chard, MD. The care management team was consulted for assistance with disease management and care coordination needs.    Collaboration with Pamela Key BSW  for Case Collaboration  in response to provider referral for case management and/or care coordination services.   Consent to Services:  The patient was given the following information about Chronic Care Management services today, agreed to services, and gave verbal consent: 1. CCM service includes personalized support from designated clinical staff supervised by the primary care provider, including individualized plan of care and coordination with other care providers 2. 24/7 contact phone numbers for assistance for urgent and routine care needs. 3. Service will only be billed when office clinical staff spend 20 minutes or more in a month to coordinate care. 4. Only one practitioner may furnish and bill the service in a calendar month. 5.The patient may stop CCM services at any time (effective at the end of the month) by phone call to the office staff. 6. The patient will be responsible for cost sharing (co-pay) of up to 20% of the service fee (after annual deductible is met). Patient agreed to services and consent obtained.  Patient agreed to services and verbal consent obtained.   Assessment: Review of patient past medical history, allergies, medications, health status, including review of consultants reports, laboratory and other test data, was performed as part of comprehensive evaluation and provision of chronic care management services.   SDOH (Social Determinants of Health) assessments and interventions performed:  Yes, see care plan   CCM Care Plan  No Known Allergies  Outpatient Encounter Medications as of 09/29/2020   Medication Sig  . allopurinol (ZYLOPRIM) 100 MG tablet TAKE 1 TABLET BY MOUTH EVERY DAY  . colchicine 0.6 MG tablet Take 1 tablet (0.6 mg total) by mouth daily for 5 days. (Patient not taking: No sig reported)  . ferrous sulfate 325 (65 FE) MG tablet Take 325 mg by mouth See admin instructions. Take one tablet (325 mg) by mouth daily Monday thru Friday (skip Saturday and Sunday)  . Misc Natural Products (BLOOD SUGAR BALANCE PO) Take 1 capsule by mouth daily.   . Multiple Vitamin (MULTIVITAMIN WITH MINERALS) TABS tablet Take 1 tablet by mouth daily with breakfast.  . NON FORMULARY daily as needed. CBD capsules Pure organic hemp extract  . pravastatin (PRAVACHOL) 40 MG tablet TAKE 1 TABLET BY MOUTH MONDAY - FRIDAY  . valsartan (DIOVAN) 160 MG tablet Take 1 tablet (160 mg total) by mouth daily.   No facility-administered encounter medications on file as of 09/29/2020.    Patient Active Problem List   Diagnosis Date Noted  . Pure hypercholesterolemia 06/17/2018  . Class 1 obesity due to excess calories with serious comorbidity and body mass index (BMI) of 33.0 to 33.9 in adult 06/17/2018  . Type II diabetes mellitus, uncontrolled ([place])   . Benign hypertension with chronic kidney disease, stage II 03/01/2018    Conditions to be addressed/monitored:DMII and Hypertensive Nephropathy, Memory deficit   Care Plan : Assist with Chronic Care Management and Care Coordination needs  Updates made by Lynne Logan, RN since 09/29/2020 12:00 AM    Problem: Assist with Chronic Care Management and Care Coordination needs   Priority: High    Goal: Assist with Chronic Care Management  and Care Cooridnation needs   Start Date: 09/29/2020  Expected End Date: 11/10/2020  This Visit's Progress: On track  Note:   Current Barriers:   Chronic Disease Management support, education, chronic care management and care coordination needs related to Type 2 diabetes, Hypertensive nephropathy, Memory deficit with  RNCM, SW and Pharmacy Care Management and Care coordination needs Case Manager Clinical Goal(s):  [name] Patient will work with the CCM team to address needs related to chronic care management and care coordination needs related to Type 2 diabetes, Hypertensive nephropathy, Memory deficit with RNCM, SW and Pharmacy Care Management and Care Coordination needs Interventions:  . Collaborated with BSW to initiate plan of care to address needs related to chronic care management and care coordination needs related to Type 2 diabetes, Hypertensive nephropathy, Memory deficit with  with RNCM, SW and Pharmacy Care Management and Care Coordination needs . Collaboration with Pamela Chard, MD regarding development and update of comprehensive plan of care as evidenced by provider attestation and co-signature  Inter-disciplinary care team collaboration (see longitudinal plan of care) Patient Goals/Self-Care Activities . patient will:   - Patient will work with the CCM team to address chronic care management and care coordination needs and will continue to work with the clinical team to address health care and disease management related needs.   Follow Up Plan: The care management team will reach out to the patient again over the next 30 days.      Plan:The care management team will reach out to the patient again over the next 30 days.  Pamela Merino, RN, BSN, CCM Care Management Coordinator [facility]  Direct Phone: [phone]

## 2020-09-29 NOTE — Chronic Care Management (AMB) (Signed)
Chronic Care Management    Social Work Note  09/29/2020 Name: JADDA HUNSUCKER MRN: [id number] DOB: [birth year]  Pamela Key is a 80 y.o. year old female who is a primary care patient of Glendale Chard, MD. The CCM team was consulted to assist the patient with chronic disease management and/or care coordination needs related to: Level of Care Concerns.   Engaged with patient daughter by phone for initial visit in response to provider referral for social work chronic care management and care coordination services.   Consent to Services:  The patient was given the following information about Chronic Care Management services today, agreed to services, and gave verbal consent: 1. CCM service includes personalized support from designated clinical staff supervised by the primary care provider, including individualized plan of care and coordination with other care providers 2. 24/7 contact phone numbers for assistance for urgent and routine care needs. 3. Service will only be billed when office clinical staff spend 20 minutes or more in a month to coordinate care. 4. Only one practitioner may furnish and bill the service in a calendar month. 5.The patient may stop CCM services at any time (effective at the end of the month) by phone call to the office staff. 6. The patient will be responsible for cost sharing (co-pay) of up to 20% of the service fee (after annual deductible is met). Patient agreed to services and consent obtained.  Patient agreed to services and consent obtained.   Assessment: Review of patient past medical history, allergies, medications, and health status, including review of relevant consultants reports was performed today as part of a comprehensive evaluation and provision of chronic care management and care coordination services.     SDOH (Social Determinants of Health) assessments and interventions performed:  SDOH Interventions   Flowsheet Row Most Recent Value  SDOH  Interventions   Food Insecurity Interventions Intervention Not Indicated  Housing Interventions Intervention Not Indicated  Transportation Interventions Intervention Not Indicated  [pt has SCAT but forgets to access and continues to take city bus]       Advanced Directives Status: Not addressed in this encounter.  CCM Care Plan  No Known Allergies  Outpatient Encounter Medications as of 09/29/2020  Medication Sig  . allopurinol (ZYLOPRIM) 100 MG tablet TAKE 1 TABLET BY MOUTH EVERY DAY  . colchicine 0.6 MG tablet Take 1 tablet (0.6 mg total) by mouth daily for 5 days. (Patient not taking: No sig reported)  . ferrous sulfate 325 (65 FE) MG tablet Take 325 mg by mouth See admin instructions. Take one tablet (325 mg) by mouth daily Monday thru Friday (skip Saturday and Sunday)  . Misc Natural Products (BLOOD SUGAR BALANCE PO) Take 1 capsule by mouth daily.   . Multiple Vitamin (MULTIVITAMIN WITH MINERALS) TABS tablet Take 1 tablet by mouth daily with breakfast.  . NON FORMULARY daily as needed. CBD capsules Pure organic hemp extract  . pravastatin (PRAVACHOL) 40 MG tablet TAKE 1 TABLET BY MOUTH MONDAY - FRIDAY  . valsartan (DIOVAN) 160 MG tablet Take 1 tablet (160 mg total) by mouth daily.   No facility-administered encounter medications on file as of 09/29/2020.    Patient Active Problem List   Diagnosis Date Noted  . Pure hypercholesterolemia 06/17/2018  . Class 1 obesity due to excess calories with serious comorbidity and body mass index (BMI) of 33.0 to 33.9 in adult 06/17/2018  . Type II diabetes mellitus, uncontrolled ([place])   . Benign hypertension with chronic kidney disease, stage  II 03/01/2018    Conditions to be addressed/monitored: HTN and DMII; Level of care concerns  Care Plan : Social Work [facility] Care Plan  Updates made by [name] since 09/29/2020 12:00 AM    Problem: Safety - family concerned pt has dementia     Long-Range Goal: Home and Family Safety Maintained    Start Date: 09/29/2020  Expected End Date: 12/28/2020  Priority: High  Note:   Current Barriers:  . Chronic disease management support and education needs related to HTN and DM  . Limited support system . Cognitive Decline  Social Worker Clinical Goal(s):  [name] Over the next 90 days the patient and her daughter will work with SW to identify resources to assist with patient care needs . Over the next 10 days the patient and her children will follow up on status of Medicaid application as directed by SW . Over the next 30 days the patient will schedule an appointment with neurology SW Interventions:  . Inter-disciplinary care team collaboration (see longitudinal plan of care) . Collaboration with Glendale Chard, MD regarding development and update of comprehensive plan of care as evidenced by provider attestation and co-signature . Successful outbound call placed to the patients daughter to follow up on recent SW referral  . Discussed the patient is currently living with her grandson due to concerns with bedbugs in the patients home and patients inability to care for self . Family reports the patient has been wearing the same clothes each day, is forgetting to take medications, and has been found to burn pots while cooking . Patient did have a neurology appointment to assess for dementia but was a no show - family has requested a new appointment be scheduled through the patients daughter so the family can accompany the patient . Patients daughter indicates they do have POA but will need a dementia diagnosis before they can step in and help care for the patient . Discussed the patients son has initiated a Medicaid application but has not completed the process due to needing access to financial documents . Advised the patients daughter of resource options such as Medicaid [facility], [facility], or placement . Determined the family is unsure if they will want to place the patient . Discussed  concerns with safety due to recent event where the patient accessed the [place] bus to attend an appointment a day early - the family did not know where the patient went when she accessed the bus. The patient does have SCAT but is forgetful she may access this service for transportation . Encouraged the patients daughter to speak with her brother over the next week to determine status of Medicaid application and determine if the family may be interested in [facility] or placement . Scheduled follow up call over the next week . Encouraged the patients daughter to contact SW as needed prior to next scheduled call . Collaboration with [place] to review interventions and plan  Patient Goals/Self-Care Activities . patient will: with the help of her family  -  Follow up on status of Medicaid application - Discuss options to enroll in [facility] -Contact SW as needed prior to next scheduled call -Attend neurology appointment once it is scheduled  Follow Up Plan:  SW will follow up with the patients daughter over the next week       Follow Up Plan: SW will follow up with patient by phone over the next  week      [name], BSW, CDP Social Worker, Certified Dementia Practitioner [place] [facility] [place] [facility] [phone]  Total time spent performing care coordination and/or care management activities with the patient by phone or face to face = 40 minutes.

## 2020-09-30 LAB — RPR QUALITATIVE: RPR Ser Ql: NONREACTIVE

## 2020-09-30 LAB — SPECIMEN STATUS REPORT

## 2020-09-30 LAB — VITAMIN B12: Vitamin B-12: 226 pg/mL — ABNORMAL LOW (ref 232–1245)

## 2020-10-04 ENCOUNTER — Ambulatory Visit: Payer: Medicare Other

## 2020-10-04 DIAGNOSIS — I129 Hypertensive chronic kidney disease with stage 1 through stage 4 chronic kidney disease, or unspecified chronic kidney disease: Secondary | ICD-10-CM

## 2020-10-04 DIAGNOSIS — E1122 Type 2 diabetes mellitus with diabetic chronic kidney disease: Secondary | ICD-10-CM | POA: Diagnosis not present

## 2020-10-04 DIAGNOSIS — R413 Other amnesia: Secondary | ICD-10-CM

## 2020-10-04 DIAGNOSIS — N182 Chronic kidney disease, stage 2 (mild): Secondary | ICD-10-CM

## 2020-10-04 NOTE — Chronic Care Management (AMB) (Signed)
Chronic Care Management    Social Work Note  10/04/2020 Name: Pamela Key MRN: [id number] DOB: April 06, 1941  Pamela Key is a 80 y.o. year old female who is a primary care patient of Glendale Chard, MD. The CCM team was consulted to assist the patient with chronic disease management and/or care coordination needs related to: [employer]  and Level of Care Concerns.   Engaged with patient daughter by phone for follow up visit in response to provider referral for social work chronic care management and care coordination services.   Consent to Services:  The patient was given information about Chronic Care Management services, agreed to services, and gave verbal consent prior to initiation of services.  Please see initial visit note for detailed documentation.   Patient agreed to services and consent obtained.   Assessment: Review of patient past medical history, allergies, medications, and health status, including review of relevant consultants reports was performed today as part of a comprehensive evaluation and provision of chronic care management and care coordination services.     SDOH (Social Determinants of Health) assessments and interventions performed:    Advanced Directives Status: Not addressed in this encounter.  CCM Care Plan  No Known Allergies  Outpatient Encounter Medications as of 10/04/2020  Medication Sig  . allopurinol (ZYLOPRIM) 100 MG tablet TAKE 1 TABLET BY MOUTH EVERY DAY  . colchicine 0.6 MG tablet Take 1 tablet (0.6 mg total) by mouth daily for 5 days. (Patient not taking: No sig reported)  . ferrous sulfate 325 (65 FE) MG tablet Take 325 mg by mouth See admin instructions. Take one tablet (325 mg) by mouth daily Monday thru Friday (skip Saturday and Sunday)  . Misc Natural Products (BLOOD SUGAR BALANCE PO) Take 1 capsule by mouth daily.   . Multiple Vitamin (MULTIVITAMIN WITH MINERALS) TABS tablet Take 1 tablet by mouth daily with breakfast.   . NON FORMULARY daily as needed. CBD capsules Pure organic hemp extract  . pravastatin (PRAVACHOL) 40 MG tablet TAKE 1 TABLET BY MOUTH MONDAY - FRIDAY  . valsartan (DIOVAN) 160 MG tablet Take 1 tablet (160 mg total) by mouth daily.   No facility-administered encounter medications on file as of 10/04/2020.    Patient Active Problem List   Diagnosis Date Noted  . Pure hypercholesterolemia 06/17/2018  . Class 1 obesity due to excess calories with serious comorbidity and body mass index (BMI) of 33.0 to 33.9 in adult 06/17/2018  . Type II diabetes mellitus, uncontrolled ([place])   . Benign hypertension with chronic kidney disease, stage II 03/01/2018    Conditions to be addressed/monitored: HTN and DMII; Level of care concerns and Limited access to caregiver  Care Plan : Social Work [place]  Updates made by [name] since 10/04/2020 12:00 AM    Problem: Safety - family concerned pt has dementia     Long-Range Goal: Home and Family Safety Maintained   Start Date: 09/29/2020  Expected End Date: 12/28/2020  This Visit's Progress: On track  Priority: High  Note:   Current Barriers:  . Chronic disease management support and education needs related to HTN and DM  . Limited support system . Cognitive Decline  Social Worker Clinical Goal(s):  [name] Over the next 90 days the patient and her daughter will work with SW to identify resources to assist with patient care needs . Over the next 10 days the patient and her children will follow up on status of Medicaid application as directed by  SW -Goal Met . Over the next 30 days the patient will schedule an appointment with neurology -Goal Met SW Interventions:  . Inter-disciplinary care team collaboration (see longitudinal plan of care) . Collaboration with Glendale Chard, MD regarding development and update of comprehensive plan of care as evidenced by provider attestation and co-signature . Successful outbound call placed to the patients  daughter to assess goal progression . Discussed [name] and her brother have spoken and determined the patients income level is too high to qualify for Medicaid . Determined the patient does have a neurology appointment scheduled for Friday May 27th - her grand-daughter will assist with transportation . [name] reports she and her brother do not want the patient placed but would like resources to assist with patient care needs- she reports they are still discussing if [facility] may be a good fit for the patient . Provided verbal education about the [facility] program . Encouraged [name] to contact [facility] to obtain more information including if the patient would be responsible for a monthly co-pay . Scheduled follow up call over the next month  Patient Goals/Self-Care Activities . patient will: with the help of her family  -  Follow up on status of Medicaid application - Discuss options to enroll in [facility] -Contact SW as needed prior to next scheduled call -Attend neurology appointment once it is scheduled  Follow Up Plan:  SW will follow up with the patients daughter over the next month       Follow Up Plan: SW will follow up with patient by phone over the next month      [name], BSW, CDP Social Worker, Certified Dementia Practitioner [place] [facility] [place] [facility] [phone]  Total time spent performing care coordination and/or care management activities with the patient by phone or face to face = 23 minutes.

## 2020-10-04 NOTE — Patient Instructions (Signed)
Social Worker Visit Information  Goals we discussed today:  Goals Addressed            This Visit's Progress   . Home and Family Safety Maintained   On track    Timeframe:  Long-Range Goal Priority:  High Start Date:  5.19.22                           Expected End Date: 8.17.22                       Next planned outreach: 6.10.22  Patient Goals/Self-Care Activities . patient will: with the help of her family  - Follow up on status of Medicaid application - Discuss options to enroll in [facility] -Contact SW as needed prior to next scheduled call -Attend neurology appointment once it is scheduled        Materials Provided: Verbal education about community resources provided by phone  Follow Up Plan: SW will follow up with patient by phone over the next month   [name], BSW, CDP Social Worker, Certified Dementia Practitioner [facility] Care Management [phone]

## 2020-10-06 NOTE — Progress Notes (Deleted)
Assessment/Plan:   80 y.o. year old female with risk factors including hypertension, hyperlipidemia, CAD, anxiety, depression and Will proceed with further workup. Recommendations are as follows   Memory Loss  . MRI of the brain to evaluate for bleeding, brain size and other abnormalities . Neurocognitive testing to further determine what causes memory changes including sleep, stress, anxiety depression . Replenish vitamin B12, 1000 mcg daily . Discussed safety both in and out of the home.  . Discussed the importance of regular daily schedule with inclusion of crossword puzzles to maintain brain function.  . Monitor mood with PCP.  . Stay active exercising  at least 30 minutes at least 3 times a week.  . Naps should be scheduled and should be no longer than 60 minutes and should not occur after 2 PM.  . Mediterranean diet is recommended  . Folllow up once results above are available   Subjective:    The patient is seen in neurologic consultation at the request of [name], MD for the evaluation of memory loss.  The patient is accompanied by {relatives:[id number]} who supplements the history.  Pamela Key  is a 80 y.o. year old female with a history of hypertension, hyperlipidemia, gout, obesity with BMI of 30, chronic kidney disease stage II, anemia of chronic disease, who has had memory issues for about    Patient feels that memory is Patient lives with her grandson until her house is being painted and cleaned so that she can leave with her children (getting the house ready for her ) mood is good, without depression or irritability. Denies hallucinations or paranoia. Sleeps.  Denies vivid dreams or sleepwalking. Patient dresses up and bathes without help.  Denies missing medications and uses a pillbox.   Denies living objects in unusual places. Patient's  took over finances.  Appetite is good. denies trouble swallowing.  The patient cooks. Denies leaving the stove on or the  faucet on. She ambulates with a cane, at times, she has trouble ambulating due to gout flares involving the left foot.  She does not drive, uses [employer] for transportation. Denies headaches, trauma, or injuries to the head, double vision,  dizziness, focal numbness or tingling, unilateral weakness or tremors. Denies urine incontinence or retention. Denies constipation or diarrhea. Denies history of OSA .ETOH  Tobacco  Family History  Results from 09/26/20:    Vitamin B12: 226 RPR negative  TSH 0.746 Hemoglobin A1c 6.2 Most recent EKG 03/23/2020 was normal  No Known Allergies  Current Outpatient Medications  Medication Instructions  . allopurinol (ZYLOPRIM) 100 MG tablet TAKE 1 TABLET BY MOUTH EVERY DAY  . colchicine 0.6 mg, Oral, Daily  . ferrous sulfate 325 mg, Oral, See admin instructions, Take one tablet (325 mg) by mouth daily Monday thru Friday (skip Saturday and Sunday)   . Misc Natural Products (BLOOD SUGAR BALANCE PO) 1 capsule, Oral, Daily  . Multiple Vitamin (MULTIVITAMIN WITH MINERALS) TABS tablet 1 tablet, Oral, Daily with breakfast  . NON FORMULARY Daily PRN, CBD capsules<BR>Pure organic hemp extract  . pravastatin (PRAVACHOL) 40 MG tablet TAKE 1 TABLET BY MOUTH MONDAY - FRIDAY  . valsartan (DIOVAN) 160 mg, Oral, Daily     VITALS:  There were no vitals filed for this visit.  HEENT:  Normocephalic, atraumatic. The mucous membranes are moist. The superficial temporal arteries are without ropiness or tenderness. Cardiovascular: Regular rate and rhythm. Lungs: Clear to auscultation bilaterally. Neck: There are no carotid bruits noted bilaterally.  NEUROLOGICAL:  Orientation:  No flowsheet data found. Cranial nerves: There is good facial symmetry. Extraocular muscles are intact and visual fields are full to confrontational testing. Speech is fluent and clear. Soft palate rises symmetrically and there is no tongue deviation. Hearing is intact to conversational tone. Tone:  Tone is good throughout. Sensation: Sensation is intact to light touch and pinprick throughout. Vibration is intact at the bilateral big toe. There is no extinction with double simultaneous stimulation. There is no sensory dermatomal level identified. Coordination:  The patient has no difficulty with RAM's or FNF bilaterally. Normal finger to nose  Motor: Strength is 5/5 in the bilateral upper and lower extremities. There is no pronator drift.  There are no fasciculations noted. DTR's: Deep tendon reflexes are 2/4 at the bilateral biceps, triceps, brachioradialis, patella and achilles.  Plantar responses are downgoing bilaterally. Gait and Station: The patient is able to ambulate without difficulty. The patient is able to heel toe walk without any difficulty. The patient is able to ambulate in a tandem fashion. The patient is able to stand in the Romberg position.      Total time spent on today's visit was *** 60 minutes, including both face-to-face time and nonface-to-face time.  Time included that spent on review of records (prior notes available to me/labs/imaging if pertinent), discussing treatment and goals, answering patient's questions and coordinating care.  Thank you for the opportunity to participate in the care of this nice patient.  Cc:  [name], MD  [name] 10/06/2020 3:58 PM

## 2020-10-07 ENCOUNTER — Ambulatory Visit: Payer: BC Managed Care – PPO | Admitting: Physician Assistant

## 2020-10-11 ENCOUNTER — Ambulatory Visit (INDEPENDENT_AMBULATORY_CARE_PROVIDER_SITE_OTHER): Payer: Medicare Other

## 2020-10-11 ENCOUNTER — Other Ambulatory Visit: Payer: Self-pay

## 2020-10-11 VITALS — BP 130/72 | HR 68 | Temp 97.8°F | Ht 62.0 in | Wt 177.6 lb

## 2020-10-11 DIAGNOSIS — E538 Deficiency of other specified B group vitamins: Secondary | ICD-10-CM

## 2020-10-11 DIAGNOSIS — I129 Hypertensive chronic kidney disease with stage 1 through stage 4 chronic kidney disease, or unspecified chronic kidney disease: Secondary | ICD-10-CM

## 2020-10-11 MED ORDER — CYANOCOBALAMIN 1000 MCG/ML IJ SOLN
1000.0000 ug | Freq: Once | INTRAMUSCULAR | Status: AC
  Administered 2020-10-11: 1000 ug via INTRAMUSCULAR

## 2020-10-11 NOTE — Progress Notes (Signed)
Patient is here for bpc and b12 injection. She was started on valsartan at her last visit.  BP Readings from Last 3 Encounters:  10/11/20 130/72  09/26/20 (!) 144/86  06/23/20 138/74

## 2020-10-17 DIAGNOSIS — I129 Hypertensive chronic kidney disease with stage 1 through stage 4 chronic kidney disease, or unspecified chronic kidney disease: Secondary | ICD-10-CM | POA: Diagnosis not present

## 2020-10-17 DIAGNOSIS — E1122 Type 2 diabetes mellitus with diabetic chronic kidney disease: Secondary | ICD-10-CM | POA: Diagnosis not present

## 2020-10-17 DIAGNOSIS — Z87891 Personal history of nicotine dependence: Secondary | ICD-10-CM | POA: Diagnosis not present

## 2020-10-17 DIAGNOSIS — M1A3720 Chronic gout due to renal impairment, left ankle and foot, without tophus (tophi): Secondary | ICD-10-CM | POA: Diagnosis not present

## 2020-10-17 DIAGNOSIS — N182 Chronic kidney disease, stage 2 (mild): Secondary | ICD-10-CM | POA: Diagnosis not present

## 2020-10-17 DIAGNOSIS — E7800 Pure hypercholesterolemia, unspecified: Secondary | ICD-10-CM | POA: Diagnosis not present

## 2020-10-18 ENCOUNTER — Other Ambulatory Visit: Payer: Self-pay

## 2020-10-18 ENCOUNTER — Ambulatory Visit: Payer: Medicare Other

## 2020-10-18 VITALS — BP 134/78 | HR 67 | Temp 97.9°F | Ht 62.0 in | Wt 178.4 lb

## 2020-10-18 DIAGNOSIS — E538 Deficiency of other specified B group vitamins: Secondary | ICD-10-CM

## 2020-10-18 MED ORDER — CYANOCOBALAMIN 1000 MCG/ML IJ SOLN
1000.0000 ug | Freq: Once | INTRAMUSCULAR | Status: AC
  Administered 2020-12-27: 1000 ug via INTRAMUSCULAR

## 2020-10-18 NOTE — Progress Notes (Signed)
Pt is here for b12 injection. 

## 2020-10-19 DIAGNOSIS — E1122 Type 2 diabetes mellitus with diabetic chronic kidney disease: Secondary | ICD-10-CM | POA: Diagnosis not present

## 2020-10-19 DIAGNOSIS — N182 Chronic kidney disease, stage 2 (mild): Secondary | ICD-10-CM | POA: Diagnosis not present

## 2020-10-19 DIAGNOSIS — Z87891 Personal history of nicotine dependence: Secondary | ICD-10-CM | POA: Diagnosis not present

## 2020-10-19 DIAGNOSIS — E7800 Pure hypercholesterolemia, unspecified: Secondary | ICD-10-CM | POA: Diagnosis not present

## 2020-10-19 DIAGNOSIS — M1A3720 Chronic gout due to renal impairment, left ankle and foot, without tophus (tophi): Secondary | ICD-10-CM | POA: Diagnosis not present

## 2020-10-19 DIAGNOSIS — I129 Hypertensive chronic kidney disease with stage 1 through stage 4 chronic kidney disease, or unspecified chronic kidney disease: Secondary | ICD-10-CM | POA: Diagnosis not present

## 2020-10-20 ENCOUNTER — Telehealth: Payer: Self-pay

## 2020-10-20 NOTE — Telephone Encounter (Signed)
I left a message for the pt's daughter [name] and her son Blac.  I called to let them know that the clinical nurse with [facility] said that they can't assist the patient until her home gets fumigated and treated for bed begs.

## 2020-10-21 ENCOUNTER — Telehealth: Payer: Medicare Other

## 2020-10-21 ENCOUNTER — Telehealth: Payer: Self-pay

## 2020-10-21 NOTE — Telephone Encounter (Signed)
  Care Management   Follow Up Note   10/21/2020 Name: Pamela Key MRN: [id number] DOB: Jun 10, 1940   Referred by: [name], MD Reason for referral : Chronic Care Management (Unsuccessful call)   An unsuccessful telephone outreach was attempted today. The patient was referred to the case management team for assistance with care management and care coordination. SW left a HIPAA compliant voice message for the patients daughter requesting a return call.  Follow Up Plan: The care management team will reach out to the patient again over the next 30 days.   [name], BSW, CDP Social Worker, Certified Dementia Practitioner [facility] Care Management [phone]

## 2020-10-21 NOTE — Telephone Encounter (Signed)
Tanji notified

## 2020-10-24 ENCOUNTER — Ambulatory Visit: Payer: Medicare Other | Admitting: Internal Medicine

## 2020-10-25 ENCOUNTER — Ambulatory Visit: Payer: Medicare Other

## 2020-10-26 ENCOUNTER — Encounter: Payer: Self-pay | Admitting: Internal Medicine

## 2020-10-30 DIAGNOSIS — E1165 Type 2 diabetes mellitus with hyperglycemia: Secondary | ICD-10-CM | POA: Diagnosis not present

## 2020-10-30 DIAGNOSIS — I10 Essential (primary) hypertension: Secondary | ICD-10-CM | POA: Diagnosis not present

## 2020-11-01 ENCOUNTER — Other Ambulatory Visit: Payer: Self-pay

## 2020-11-01 ENCOUNTER — Ambulatory Visit (INDEPENDENT_AMBULATORY_CARE_PROVIDER_SITE_OTHER): Payer: Medicare Other

## 2020-11-01 VITALS — BP 132/80 | HR 78 | Temp 98.3°F | Ht 62.0 in

## 2020-11-01 DIAGNOSIS — E538 Deficiency of other specified B group vitamins: Secondary | ICD-10-CM

## 2020-11-01 MED ORDER — CYANOCOBALAMIN 1000 MCG/ML IJ SOLN
1000.0000 ug | Freq: Once | INTRAMUSCULAR | Status: AC
  Administered 2020-11-01: 1000 ug via INTRAMUSCULAR

## 2020-11-01 NOTE — Progress Notes (Signed)
Pt here for b12 injection

## 2020-11-08 ENCOUNTER — Ambulatory Visit: Payer: Medicare Other

## 2020-11-10 ENCOUNTER — Telehealth: Payer: Self-pay

## 2020-11-10 NOTE — Telephone Encounter (Signed)
The son Mr Tobin Chad called and said that he was on his way to [place] to go with his mom to her neurology appt and he was wanting to know why it was canceled. He was given the contact information for that office and advised to give them a call.

## 2020-11-11 ENCOUNTER — Encounter: Payer: Self-pay | Admitting: Physician Assistant

## 2020-11-11 ENCOUNTER — Ambulatory Visit: Payer: Medicare Other | Admitting: Physician Assistant

## 2020-11-11 ENCOUNTER — Other Ambulatory Visit: Payer: Self-pay

## 2020-11-11 ENCOUNTER — Other Ambulatory Visit (INDEPENDENT_AMBULATORY_CARE_PROVIDER_SITE_OTHER): Payer: Medicare Other

## 2020-11-11 VITALS — BP 199/84 | HR 70 | Ht 64.0 in | Wt 179.6 lb

## 2020-11-11 DIAGNOSIS — R413 Other amnesia: Secondary | ICD-10-CM

## 2020-11-11 LAB — TSH: TSH: 1.04 u[IU]/mL (ref 0.35–5.50)

## 2020-11-11 NOTE — Patient Instructions (Addendum)
It was a pleasure to see you today at our office.   Recommendations:  Neurocognitive evaluation at our office MRI of the brain, the office will call you to arrange you appointment Check TSH at the lab Follow up once the results of the above are available   RECOMMENDATIONS FOR ALL PATIENTS WITH MEMORY PROBLEMS: 1. Continue to exercise (Recommend 30 minutes of walking everyday, or 3 hours every week) 2. Increase social interactions - continue going to [place] and enjoy social gatherings with friends and family 3. Eat healthy, avoid fried foods and eat more fruits and vegetables 4. Maintain adequate blood pressure, blood sugar, and blood cholesterol level. Reducing the risk of stroke and cardiovascular disease also helps promoting better memory. 5. Avoid stressful situations. Live a simple life and avoid aggravations. Organize your time and prepare for the next day in anticipation. 6. Sleep well, avoid any interruptions of sleep and avoid any distractions in the bedroom that may interfere with adequate sleep quality 7. Avoid sugar, avoid sweets as there is a strong link between excessive sugar intake, diabetes, and cognitive impairment We discussed the Mediterranean diet, which has been shown to help patients reduce the risk of progressive memory disorders and reduces cardiovascular risk. This includes eating fish, eat fruits and green leafy vegetables, nuts like almonds and hazelnuts, walnuts, and also use olive oil. Avoid fast foods and fried foods as much as possible. Avoid sweets and sugar as sugar use has been linked to worsening of memory function.  There is always a concern of gradual progression of memory problems. If this is the case, then we may need to adjust level of care according to patient needs. Support, both to the patient and caregiver, should then be put into place.      You have been referred for a neuropsychological evaluation (i.e., evaluation of memory and thinking  abilities). Please bring someone with you to this appointment if possible, as it is helpful for the doctor to hear from both you and another adult who knows you well. Please bring eyeglasses and hearing aids if you wear them.    The evaluation will take approximately 3 hours and has two parts:   The first part is a clinical interview with the neuropsychologist (Dr. Milbert Coulter or Dr. Roseanne Reno). During the interview, the neuropsychologist will speak with you and the individual you brought to the appointment.    The second part of the evaluation is testing with the doctor's technician Annabelle Harman or Selena Batten). During the testing, the technician will ask you to remember different types of material, solve problems, and answer some questionnaires. Your family member will not be present for this portion of the evaluation.   Please note: We must reserve several hours of the neuropsychologist's time and the psychometrician's time for your evaluation appointment. As such, there is a No-Show fee of $100. If you are unable to attend any of your appointments, please contact our office as soon as possible to reschedule.    FALL PRECAUTIONS: Be cautious when walking. Scan the area for obstacles that may increase the risk of trips and falls. When getting up in the mornings, sit up at the edge of the bed for a few minutes before getting out of bed. Consider elevating the bed at the head end to avoid drop of blood pressure when getting up. Walk always in a well-lit room (use night lights in the walls). Avoid area rugs or power cords from appliances in the middle of the walkways. Use a  walker or a cane if necessary and consider physical therapy for balance exercise. Get your eyesight checked regularly.  FINANCIAL OVERSIGHT: Supervision, especially oversight when making financial decisions or transactions is also recommended.  HOME SAFETY: Consider the safety of the kitchen when operating appliances like stoves, microwave oven, and  blender. Consider having supervision and share cooking responsibilities until no longer able to participate in those. Accidents with firearms and other hazards in the house should be identified and addressed as well.   ABILITY TO BE LEFT ALONE: If patient is unable to contact 911 operator, consider using LifeLine, or when the need is there, arrange for someone to stay with patients. Smoking is a fire hazard, consider supervision or cessation. Risk of wandering should be assessed by caregiver and if detected at any point, supervision and safe proof recommendations should be instituted.  MEDICATION SUPERVISION: Inability to self-administer medication needs to be constantly addressed. Implement a mechanism to ensure safe administration of the medications.   DRIVING: Regarding driving, in patients with progressive memory problems, driving will be impaired. We advise to have someone else do the driving if trouble finding directions or if minor accidents are reported. Independent driving assessment is available to determine safety of driving.   If you are interested in the driving assessment, you can contact the following:  [employer] in [place] [phone]  [facility] [phone]  [facility] [phone] or [phone]    Mediterranean Diet A Mediterranean diet refers to food and lifestyle choices that are based on the traditions of countries located on the [employer]. This way of eating has been shown to help prevent certain conditions and improve outcomes for people who have chronic diseases, like kidney disease and heart disease. What are tips for following this plan? Lifestyle  Cook and eat meals together with your family, when possible. Drink enough fluid to keep your urine clear or pale yellow. Be physically active every day. This includes: Aerobic exercise like running or swimming. Leisure  activities like gardening, walking, or housework. Get 7-8 hours of sleep each night. If recommended by your health care provider, drink red wine in moderation. This means 1 glass a day for nonpregnant women and 2 glasses a day for men. A glass of wine equals 5 oz (150 mL). Reading food labels  Check the serving size of packaged foods. For foods such as rice and pasta, the serving size refers to the amount of cooked product, not dry. Check the total fat in packaged foods. Avoid foods that have saturated fat or trans fats. Check the ingredients list for added sugars, such as corn syrup. Shopping  At the grocery store, buy most of your food from the areas near the walls of the store. This includes: Fresh fruits and vegetables (produce). Grains, beans, nuts, and seeds. Some of these may be available in unpackaged forms or large amounts (in bulk). Fresh seafood. Poultry and eggs. Low-fat dairy products. Buy whole ingredients instead of prepackaged foods. Buy fresh fruits and vegetables in-season from local farmers markets. Buy frozen fruits and vegetables in resealable bags. If you do not have access to quality fresh seafood, buy precooked frozen shrimp or canned fish, such as tuna, salmon, or sardines. Buy small amounts of raw or cooked vegetables, salads, or olives from the deli or salad bar at your store. Stock your pantry so you always have certain foods on hand, such as olive oil, canned tuna, canned tomatoes, rice, pasta, and  beans. Cooking  Cook foods with extra-virgin olive oil instead of using butter or other vegetable oils. Have meat as a side dish, and have vegetables or grains as your main dish. This means having meat in small portions or adding small amounts of meat to foods like pasta or stew. Use beans or vegetables instead of meat in common dishes like chili or lasagna. Experiment with different cooking methods. Try roasting or broiling vegetables instead of steaming or sauteing  them. Add frozen vegetables to soups, stews, pasta, or rice. Add nuts or seeds for added healthy fat at each meal. You can add these to yogurt, salads, or vegetable dishes. Marinate fish or vegetables using olive oil, lemon juice, garlic, and fresh herbs. Meal planning  Plan to eat 1 vegetarian meal one day each week. Try to work up to 2 vegetarian meals, if possible. Eat seafood 2 or more times a week. Have healthy snacks readily available, such as: Vegetable sticks with hummus. Greek yogurt. Fruit and nut trail mix. Eat balanced meals throughout the week. This includes: Fruit: 2-3 servings a day Vegetables: 4-5 servings a day Low-fat dairy: 2 servings a day Fish, poultry, or lean meat: 1 serving a day Beans and legumes: 2 or more servings a week Nuts and seeds: 1-2 servings a day Whole grains: 6-8 servings a day Extra-virgin olive oil: 3-4 servings a day Limit red meat and sweets to only a few servings a month What are my food choices? Mediterranean diet Recommended Grains: Whole-grain pasta. Brown rice. Bulgar wheat. Polenta. Couscous. Whole-wheat bread. [place]. Vegetables: Artichokes. Beets. Broccoli. Cabbage. Carrots. Eggplant. Green beans. Chard. Kale. Spinach. Onions. Leeks. Peas. Squash. Tomatoes. Peppers. Radishes. Fruits: Apples. Apricots. Avocado. Berries. Bananas. Cherries. Dates. Figs. Grapes. Lemons. Melon. Oranges. Peaches. Plums. Pomegranate. Meats and other protein foods: Beans. Almonds. Sunflower seeds. Pine nuts. Peanuts. [place]. Salmon. Scallops. Shrimp. [place]. Tilapia. Clams. Oysters. Eggs. Dairy: Low-fat milk. Cheese. Greek yogurt. Beverages: Water. Red wine. Herbal tea. Fats and oils: Extra virgin olive oil. Avocado oil. Grape seed oil. Sweets and desserts: [place] yogurt with honey. Baked apples. Poached pears. Trail mix. Seasoning and other foods: Basil. Cilantro. Coriander. Cumin. Mint. Parsley. Sage. Rosemary. Tarragon. Garlic. Oregano. Thyme. Pepper.  Balsalmic vinegar. Tahini. Hummus. Tomato sauce. Olives. Mushrooms. Limit these Grains: Prepackaged pasta or rice dishes. Prepackaged cereal with added sugar. Vegetables: Deep fried potatoes (french fries). Fruits: Fruit canned in syrup. Meats and other protein foods: Beef. Pork. Lamb. Poultry with skin. Hot dogs. Berniece Salines. Dairy: Ice cream. Sour cream. Whole milk. Beverages: Juice. Sugar-sweetened soft drinks. Beer. Liquor and spirits. Fats and oils: Butter. Canola oil. Vegetable oil. Beef fat (tallow). Lard. Sweets and desserts: Cookies. Cakes. Pies. Candy. Seasoning and other foods: Mayonnaise. Premade sauces and marinades. The items listed may not be a complete list. Talk with your dietitian about what dietary choices are right for you. Summary The Mediterranean diet includes both food and lifestyle choices. Eat a variety of fresh fruits and vegetables, beans, nuts, seeds, and whole grains. Limit the amount of red meat and sweets that you eat. Talk with your health care provider about whether it is safe for you to drink red wine in moderation. This means 1 glass a day for nonpregnant women and 2 glasses a day for men. A glass of wine equals 5 oz (150 mL). This information is not intended to replace advice given to you by your health care provider. Make sure you discuss any questions you have with your health care provider. Document  Released: 12/22/2015 Document Revised: 01/24/2016 Document Reviewed: 12/22/2015 Elsevier Interactive Patient Education  2017 [employer].
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
## 2020-11-15 ENCOUNTER — Ambulatory Visit: Payer: Medicare Other

## 2020-11-15 ENCOUNTER — Ambulatory Visit (INDEPENDENT_AMBULATORY_CARE_PROVIDER_SITE_OTHER): Payer: Medicare Other

## 2020-11-15 DIAGNOSIS — R413 Other amnesia: Secondary | ICD-10-CM

## 2020-11-15 DIAGNOSIS — I129 Hypertensive chronic kidney disease with stage 1 through stage 4 chronic kidney disease, or unspecified chronic kidney disease: Secondary | ICD-10-CM

## 2020-11-15 DIAGNOSIS — N182 Chronic kidney disease, stage 2 (mild): Secondary | ICD-10-CM

## 2020-11-15 DIAGNOSIS — E1122 Type 2 diabetes mellitus with diabetic chronic kidney disease: Secondary | ICD-10-CM

## 2020-11-15 NOTE — Chronic Care Management (AMB) (Signed)
Chronic Care Management    Social Work Note  11/15/2020 Name: Pamela Key MRN: [id number] DOB: [birth year]  Pamela Key is a 80 y.o. year old female who is a primary care patient of Glendale Chard, MD. The CCM team was consulted to assist the patient with chronic disease management and/or care coordination needs related to: Level of Care Concerns.   Engaged with patient daughter by phone  for follow up visit in response to provider referral for social work chronic care management and care coordination services.   Consent to Services:  The patient was given information about Chronic Care Management services, agreed to services, and gave verbal consent prior to initiation of services.  Please see initial visit note for detailed documentation.   Patient agreed to services and consent obtained.   Assessment: Review of patient past medical history, allergies, medications, and health status, including review of relevant consultants reports was performed today as part of a comprehensive evaluation and provision of chronic care management and care coordination services.     SDOH (Social Determinants of Health) assessments and interventions performed:    Advanced Directives Status: Not addressed in this encounter.  CCM Care Plan  No Known Allergies  Outpatient Encounter Medications as of 11/15/2020  Medication Sig   allopurinol (ZYLOPRIM) 100 MG tablet TAKE 1 TABLET BY MOUTH EVERY DAY   colchicine 0.6 MG tablet Take 1 tablet (0.6 mg total) by mouth daily for 5 days. (Patient not taking: No sig reported)   ferrous sulfate 325 (65 FE) MG tablet Take 325 mg by mouth See admin instructions. Take one tablet (325 mg) by mouth daily Monday thru Friday (skip Saturday and Sunday)   Misc Natural Products (BLOOD SUGAR BALANCE PO) Take 1 capsule by mouth daily.    Multiple Vitamin (MULTIVITAMIN WITH MINERALS) TABS tablet Take 1 tablet by mouth daily with breakfast.   NON FORMULARY daily as  needed. CBD capsules Pure organic hemp extract   pravastatin (PRAVACHOL) 40 MG tablet TAKE 1 TABLET BY MOUTH MONDAY - FRIDAY   valsartan (DIOVAN) 160 MG tablet Take 1 tablet (160 mg total) by mouth daily.   Facility-Administered Encounter Medications as of 11/15/2020  Medication   cyanocobalamin ((VITAMIN B-12)) injection 1,000 mcg    Patient Active Problem List   Diagnosis Date Noted   Pure hypercholesterolemia 06/17/2018   Morbid obesity ([place]) 06/17/2018   Type II diabetes mellitus, uncontrolled ([place])    Benign hypertension with chronic kidney disease, stage II 03/01/2018    Conditions to be addressed/monitored: HTN, DMII, and Memory Deficit ; Level of care concerns  Care Plan : Social Work [facility] Care Plan  Updates made by [name] since 11/15/2020 12:00 AM     Problem: Safety - family concerned pt has dementia      Long-Range Goal: Home and Family Safety Maintained   Start Date: 09/29/2020  Expected End Date: 12/28/2020  This Visit's Progress: On track  Recent Progress: On track  Priority: High  Note:   Current Barriers:  Chronic disease management support and education needs related to HTN and DM  Limited support system Cognitive Decline  Social Worker Clinical Goal(s):  Over the next 90 days the patient and her daughter will work with SW to identify resources to assist with patient care needs Over the next 10 days the patient and her children will follow up on status of Medicaid application as directed by SW -Goal Met Over the next 30 days the patient will schedule an appointment  with neurology -Goal Met SW Interventions:  Inter-disciplinary care team collaboration (see longitudinal plan of care) Collaboration with Glendale Chard, MD regarding development and update of comprehensive plan of care as evidenced by provider attestation and co-signature Successful outbound call placed to the patients daughter to assess goal progression Discussed the patient did attend  her neurology appointment on 7.1.22 and was referred for an MRI and 4 hour cognitive test - this has been scheduled in October The patient continues to live with her grandson as the family repairs the patients home Determined the families goal is to enroll the patient in [facility] once she returns to her home due to limited ability of the family to assist with patient care needs  Advised [facility] enrolls new participants once a month  Discussed plans for SW to follow up over the next 45 days to assess status of PACE enrollment Collaboration with Dr. B[facility] and [place] to advise of plan for patient to enroll in [facility] within the next 1-2 months  Patient Goals/Self-Care Activities patient will: with the help of her family  -  Follow up on status of Medicaid application - Discuss options to enroll [facility] [place] -Contact SW as needed prior to next scheduled call  Follow Up Plan:  SW will follow up with the patients daughter over the next 45 days       Follow Up Plan: SW will follow up with patient by phone over the next 45 days      [name], BSW, CDP Social Worker, Certified Dementia Practitioner [place] [facility] [place] [facility] [phone]

## 2020-11-15 NOTE — Patient Instructions (Signed)
Social Worker Visit Information  Goals we discussed today:   Goals Addressed             This Visit's Progress    Home and Family Safety Maintained   On track    Timeframe:  Long-Range Goal Priority:  High Start Date:  5.19.22                           Expected End Date: 8.17.22                       Next planned outreach: 8.11.22  Patient Goals/Self-Care Activities patient will: with the help of her family  - Follow up on status of Medicaid application - Discuss options to enroll in [facility] [place] -Contact SW as needed prior to next scheduled call          Follow Up Plan: SW will follow up with the patients daughter over the next 45 days   [name], BSW, CDP Social Worker, Certified Dementia Practitioner [facility] Care Management [phone]

## 2020-11-17 ENCOUNTER — Ambulatory Visit: Payer: Self-pay

## 2020-11-17 ENCOUNTER — Telehealth: Payer: Self-pay

## 2020-11-17 ENCOUNTER — Telehealth: Payer: Medicare Other

## 2020-11-17 ENCOUNTER — Ambulatory Visit: Payer: Medicare Other

## 2020-11-17 DIAGNOSIS — N182 Chronic kidney disease, stage 2 (mild): Secondary | ICD-10-CM

## 2020-11-17 DIAGNOSIS — I129 Hypertensive chronic kidney disease with stage 1 through stage 4 chronic kidney disease, or unspecified chronic kidney disease: Secondary | ICD-10-CM

## 2020-11-17 DIAGNOSIS — R413 Other amnesia: Secondary | ICD-10-CM

## 2020-11-17 DIAGNOSIS — E1122 Type 2 diabetes mellitus with diabetic chronic kidney disease: Secondary | ICD-10-CM

## 2020-11-17 DIAGNOSIS — E538 Deficiency of other specified B group vitamins: Secondary | ICD-10-CM

## 2020-11-17 NOTE — Telephone Encounter (Signed)
  Care Management   Follow Up Note   11/17/2020 Name: Pamela Key MRN: [id number] DOB: 03/04/1941   Referred by: [name], MD Reason for referral : Chronic Care Management (Initial RN CM Outreach - 1st attempt )   An unsuccessful telephone outreach was attempted today. The patient was referred to the case management team for assistance with care management and care coordination.   Follow Up Plan: A HIPPA compliant phone message was left for the patient providing contact information and requesting a return call.   Delsa Sale, RN, BSN, CCM Care Management Coordinator [facility] Care Management/[facility]  Direct Phone: [phone]

## 2020-11-17 NOTE — Patient Instructions (Signed)
Social Worker Visit Information  Goals we discussed today:   Goals Addressed             This Visit's Progress    Place patient on MOW wait list       Timeframe:  Short-Term Goal Priority:  High Start Date: 7.7.22                            Expected End Date: 8.6.22                       Patient Goals/Self-Care Activities patient will: With the help of her daughter  - Engage with [employer] mobile meals program          Follow Up Plan: SW will follow up with patient by phone over the next month  [name], BSW, CDP Social Worker, Certified Dementia Practitioner [facility] Care Management [phone]

## 2020-11-17 NOTE — Chronic Care Management (AMB) (Signed)
Chronic Care Management    Social Work Note  11/17/2020 Name: Pamela Key MRN: [id number] DOB: [birth year]  Pamela Key is a 80 y.o. year old female who is a primary care patient of [name], MD. The CCM team was consulted to assist the patient with chronic disease management and/or care coordination needs related to: [employer] .   Collaboration with RN Care Manager  for  care coordination needs  in response to provider referral for social work chronic care management and care coordination services.   Consent to Services:  The patient was given information about Chronic Care Management services, agreed to services, and gave verbal consent prior to initiation of services.  Please see initial visit note for detailed documentation.   Patient agreed to services and consent obtained.   Assessment: Review of patient past medical history, allergies, medications, and health status, including review of relevant consultants reports was performed today as part of a comprehensive evaluation and provision of chronic care management and care coordination services.     SDOH (Social Determinants of Health) assessments and interventions performed:    Advanced Directives Status: Not addressed in this encounter.  CCM Care Plan  No Known Allergies  Outpatient Encounter Medications as of 11/17/2020  Medication Sig   allopurinol (ZYLOPRIM) 100 MG tablet TAKE 1 TABLET BY MOUTH EVERY DAY   colchicine 0.6 MG tablet Take 1 tablet (0.6 mg total) by mouth daily for 5 days. (Patient not taking: No sig reported)   ferrous sulfate 325 (65 FE) MG tablet Take 325 mg by mouth See admin instructions. Take one tablet (325 mg) by mouth daily Monday thru Friday (skip Saturday and Sunday)   Misc Natural Products (BLOOD SUGAR BALANCE PO) Take 1 capsule by mouth daily.    Multiple Vitamin (MULTIVITAMIN WITH MINERALS) TABS tablet Take 1 tablet by mouth daily with breakfast.   NON FORMULARY daily as  needed. CBD capsules Pure organic hemp extract   pravastatin (PRAVACHOL) 40 MG tablet TAKE 1 TABLET BY MOUTH MONDAY - FRIDAY   valsartan (DIOVAN) 160 MG tablet Take 1 tablet (160 mg total) by mouth daily.   Facility-Administered Encounter Medications as of 11/17/2020  Medication   cyanocobalamin ((VITAMIN B-12)) injection 1,000 mcg    Patient Active Problem List   Diagnosis Date Noted   Pure hypercholesterolemia 06/17/2018   Morbid obesity ([facility]) 06/17/2018   Type II diabetes mellitus, uncontrolled ([facility])    Benign hypertension with chronic kidney disease, stage II 03/01/2018    Conditions to be addressed/monitored:  DM II, Hypertensive Nephropathy, and Memory Deficit ;  Limited ability to prepare own meals  Care Plan : Social Work [facility] Care Plan  Updates made by [name] since 11/17/2020 12:00 AM     Problem: Healthy Nutrition (Wellness)      Goal: Place patient on MOW wait list   Start Date: 11/17/2020  Expected End Date: 12/17/2020  This Visit's Progress: On track  Priority: High  Note:   Current Barriers:  Chronic disease management support and education needs related to  DM II, Hypertensive Nephropathy, and Memory Deficit   Inability to prepare own meals  Social Worker Clinical Goal(s):  Patient with the help of her children will work with SW to be placed on mobile meals wait list  SW Interventions:  Inter-disciplinary care team collaboration (see longitudinal plan of care) Collaboration with [name], MD regarding development and update of comprehensive plan of care as evidenced by provider attestation and co-signature Collaboration with  RN Care Manager who indicates patients son requests the patient be referred to mobile meals with his sister as point of contact Referral placed to [employer] [facility] program with Delia Heady as point of contact Noted patient referral had previously been placed April 2021 - unsure why patient has not received  meals at this time Collaboration with [employer] [facility] [place] to request feedback on previous referral  Patient Goals/Self-Care Activities patient will: With the help of her daughter  -  Engage with [employer] mobile meals program  Follow Up Plan:  SW will follow up with the patient over the next month       Follow Up Plan: SW will follow up with patient by phone over the next month      [name], BSW, CDP Social Worker, Certified Dementia Practitioner [facility] Care Management [phone]

## 2020-11-21 ENCOUNTER — Encounter: Payer: Self-pay | Admitting: Physician Assistant

## 2020-11-21 ENCOUNTER — Other Ambulatory Visit: Payer: Self-pay

## 2020-11-21 ENCOUNTER — Ambulatory Visit
Admission: RE | Admit: 2020-11-21 | Discharge: 2020-11-21 | Disposition: A | Discharge status: Home / Self Care | Payer: Medicare Other | Source: Ambulatory Visit | Attending: Physician Assistant | Admitting: Physician Assistant

## 2020-11-21 DIAGNOSIS — R413 Other amnesia: Secondary | ICD-10-CM | POA: Diagnosis not present

## 2020-11-21 DIAGNOSIS — I6782 Cerebral ischemia: Secondary | ICD-10-CM | POA: Diagnosis not present

## 2020-11-21 DIAGNOSIS — G319 Degenerative disease of nervous system, unspecified: Secondary | ICD-10-CM | POA: Diagnosis not present

## 2020-11-22 ENCOUNTER — Telehealth: Payer: Self-pay | Admitting: Physician Assistant

## 2020-11-22 ENCOUNTER — Ambulatory Visit: Payer: Medicare Other

## 2020-11-22 NOTE — Telephone Encounter (Signed)
Pt daughter is calling heather back regarding her mothers MRI results.

## 2020-11-23 ENCOUNTER — Encounter: Payer: Self-pay | Admitting: Internal Medicine

## 2020-11-24 ENCOUNTER — Telehealth: Payer: Self-pay

## 2020-11-24 NOTE — Telephone Encounter (Signed)
-----   Message from Marcos Eke, PA-C sent at 11/22/2020  7:09 AM EDT ----- Please, inform patient that the MRI results show just some aging changes in the vessels and some age related atrophy  Thank you so much!

## 2020-11-24 NOTE — Telephone Encounter (Signed)
See result notes. 

## 2020-11-24 NOTE — Telephone Encounter (Signed)
Spoke with pt son and informed him MRI results show just some aging changes in the vessels and some age related atrophy

## 2020-11-24 NOTE — Patient Instructions (Signed)
Patient Care Plan: Assist with Chronic Care Management and Care Coordination needs  Completed 11/23/2020   Problem Identified: Assist with Chronic Care Management and Care Coordination needs Resolved 11/17/2020  Priority: High     Goal: Assist with Chronic Care Management and Care Cooridnation needs Completed 11/17/2020  Start Date: 09/29/2020  Expected End Date: 11/10/2020  Recent Progress: On track  Note:   Current Barriers:  Chronic Disease Management support, education, chronic care management and care coordination needs related to Type 2 diabetes, Hypertensive nephropathy, Memory deficit with RNCM, SW and Pharmacy Care Management and Care coordination needs Case Manager Clinical Goal(s):  Patient will work with the CCM team to address needs related to chronic care management and care coordination needs related to Type 2 diabetes, Hypertensive nephropathy, Memory deficit with RNCM, SW and Pharmacy Care Management and Care Coordination needs Interventions:  Collaborated with BSW to initiate plan of care to address needs related to chronic care management and care coordination needs related to Type 2 diabetes, Hypertensive nephropathy, Memory deficit with  with RNCM, SW and Pharmacy Care Management and Care Coordination needs Collaboration with Glendale Chard, MD regarding development and update of comprehensive plan of care as evidenced by provider attestation and co-signature Inter-disciplinary care team collaboration (see longitudinal plan of care) Patient Goals/Self-Care Activities patient will:   - Patient will work with the CCM team to address chronic care management and care coordination needs and will continue to work with the clinical team to address health care and disease management related needs.   Follow Up Plan: The care management team will reach out to the patient again over the next 30 days.      Patient Care Plan: Social Work [facility] Care Plan     Problem Identified: Safety -  family concerned pt has dementia      Long-Range Goal: Home and Family Safety Maintained   Start Date: 09/29/2020  Expected End Date: 12/28/2020  This Visit's Progress: On track  Recent Progress: On track  Priority: High  Note:   Current Barriers:  Chronic disease management support and education needs related to HTN and DM  Limited support system Cognitive Decline  Social Worker Clinical Goal(s):  Over the next 90 days the patient and her daughter will work with SW to identify resources to assist with patient care needs Over the next 10 days the patient and her children will follow up on status of Medicaid application as directed by SW -Goal Met Over the next 30 days the patient will schedule an appointment with neurology -Goal Met SW Interventions:  Inter-disciplinary care team collaboration (see longitudinal plan of care) Collaboration with Glendale Chard, MD regarding development and update of comprehensive plan of care as evidenced by provider attestation and co-signature Successful outbound call placed to the patients daughter to assess goal progression Discussed the patient did attend her neurology appointment on 7.1.22 and was referred for an MRI and 4 hour cognitive test - this has been scheduled in October The patient continues to live with her grandson as the family repairs the patients home Determined the families goal is to enroll the patient in [facility] once she returns to her home due to limited ability of the family to assist with patient care needs  Advised PACE enrolls new participants once a month  Discussed plans for SW to follow up over the next 45 days to assess status of PACE enrollment Collaboration with Dr. [facility] and [place] to advise of plan for  patient to enroll in PACE within the next 1-2 months  Patient Goals/Self-Care Activities patient will: with the help of her family  -  Follow up on status of Medicaid application - Discuss options to  en[place] in [facility] [place] -Contact SW as needed prior to next scheduled call  Follow Up Plan:  SW will follow up with the patients daughter over the next 45 days     Problem Identified: Healthy Nutrition (Wellness)      Goal: Place patient on MOW wait list   Start Date: 11/17/2020  Expected End Date: 12/17/2020  This Visit's Progress: On track  Priority: High  Note:   Current Barriers:  Chronic disease management support and education needs related to  DM II, Hypertensive Nephropathy, and Memory Deficit   Inability to prepare own meals  Social Worker Clinical Goal(s):  Patient with the help of her children will work with SW to be placed on mobile meals wait list  SW Interventions:  Inter-disciplinary care team collaboration (see longitudinal plan of care) Collaboration with Glendale Chard, MD regarding development and update of comprehensive plan of care as evidenced by provider attestation and co-signature Collaboration with [place] who indicates patients son requests the patient be referred to mobile meals with his sister as point of contact Referral placed to [employer] [facility] [employer] program with Karie Schwalbe as point of contact Noted patient referral had previously been placed April 2021 - unsure why patient has not received meals at this time Collaboration with [place] to request feedback on previous referral  Patient Goals/Self-Care Activities patient will: With the help of her daughter  -  Engage with [employer] mobile meals program  Follow Up Plan:  SW will follow up with the patient over the next month     Patient Care Plan: Dementia (Adult)     Problem Identified: Harm or Injury   Priority: High     Long-Range Goal: Harm or Injury Prevented   Start Date: 11/17/2020  Expected End Date: 11/17/2021  This Visit's Progress: On track  Priority: High  Note:   Current Barriers:  Ineffective Self Health Maintenance in a  patient with Dementia Clinical Goal(s):  Collaboration with Glendale Chard, MD regarding development and update of comprehensive plan of care as evidenced by provider attestation and co-signature Inter-disciplinary care team collaboration (see longitudinal plan of care) patient will work with care management team to address care coordination and chronic disease management needs related to Disease Management Educational Needs Care Coordination Medication Management and Education Medication Reconciliation Psychosocial Support Dementia and Caregiver Support   Interventions:  11/17/20 completed successful call with patient's son [name] Evaluation of current treatment plan related to Dementia, self-management and patient's adherence to plan as established by provider. Collaboration with Glendale Chard, MD regarding development and update of comprehensive plan of care as evidenced by provider attestation       and co-signature Inter-disciplinary care team collaboration (see longitudinal plan of care) Determined patient is currently living with her grandson while her home is being renovated and cleaned from bed bugs  Discussed patient's adult children live out of state but plan to stay in close communication with their mother once she is back in her home Determined son [name] and daughter [name] are her primary caregivers, however they live out of state and will be monitoring her remotely Discussed they are interested in enrolling their mom in the PACE program and are currently engaged with the embedded  BSW  Provided education to patient about basic disease process related to Dementia and what to expect  Review of patient status, including review of consultant's reports, relevant laboratory and other test results, and medications completed. Reviewed medications with patient and discussed importance of medication adherence Determined the patient misses some of her medications due to forgetting to take  them; Educated on options to help reinforce the patient is taking her medications exactly as prescribed and determined a pill box is needed as well as possible setting alarms for the patient as a reminder, having the family call to instructed her when meds are due and having her grandson who lives local to stop in and supervise the patient taking her medications  Discussed patient completed an initial Neurological consultation with Dr. Shawn Route on 11/11/20 for evaluation of Dementia with MRI results pending  Assessed for nutritional concerns, discussed the family is concerned the patient forgets to eat; Educated on [place] and how this program works Theme park manager with embedded [employer] who will follow up on previously submitted MOW application for patient  Discussed plans with patient for ongoing care management follow up and provided patient with direct contact information for care management team Self Care Activities:  Unable to self administer medications as prescribed Does not contact provider office for questions/concer Patient Goals: - engage with embedded BSW regarding PACE and MOW  - follow up on patient's Medicaid application status  - have a family discussion about how the patient's medication administration will be supervised to ensure medications are taken as prescribed        Follow Up Plan: Telephone follow up appointment with care management team member scheduled for: 12/16/20    Patient Care Plan: Wellness (Adult)     Problem Identified: Medication Adherence (Wellness)   Priority: High     Long-Range Goal: Medication Adherence Maintained   Start Date: 11/17/2020  Expected End Date: 11/17/2021  This Visit's Progress: On track  Priority: High  Note:   Current Barriers:  Ineffective Self Health Maintenance in a patient with Type 2 diabetes, Hypertensive nephropathy, Memory deficit, Vitamin B 12 deficiency  Clinical Goal(s):  Collaboration with Glendale Chard, MD regarding  development and update of comprehensive plan of care as evidenced by provider attestation and co-signature Inter-disciplinary care team collaboration (see longitudinal plan of care) patient will work with care management team to address care coordination and chronic disease management needs related to Disease Management Educational Needs Care Coordination Medication Management and Education Psychosocial Support Dementia and Caregiver Support   Interventions:  11/17/20 completed successful outbound call with son [name] Evaluation of current treatment plan related to Type 2 diabetes, Hypertensive nephropathy, Memory deficit, Vitamin B 12 deficiency ,  self-management and patient's adherence to plan as established by provider. Collaboration with Glendale Chard, MD regarding development and update of comprehensive plan of care as evidenced by provider attestation       and co-signature Inter-disciplinary care team collaboration (see longitudinal plan of care) Reviewed medications with patient and discussed importance of medication adherence Determined the patient misses some of her medications due to forgetting to take them; Educated on options to help reinforce the patient is taking her medications exactly as prescribed and determined a pill box is needed as well as possible setting alarms for the patient as a reminder, having the family call to instructed her when meds are due and having her grandson who lives local to stop in and supervise the patient taking her medications  Educated on how  the PACE program may be able to assist with medication administration/supervision  Discussed plans with patient for ongoing care management follow up and provided patient with direct contact information for care management team Self Care Activities:  Unable to self administer medications as prescribed Does not contact provider office for questions/concerns Patient Goals: - engage with embedded BSW regarding  PACE and MOW  - follow up on patient's Medicaid application status  - have a family discussion about how the patient's medication administration will be supervised to ensure medications are taken as prescribed    Follow Up Plan: Telephone follow up appointment with care management team member scheduled for: 12/16/20     Patient Care Plan: Diabetes Type 2 (Adult)     Problem Identified: Disease Progression (Diabetes, Type 2)   Priority: Medium     Long-Range Goal: Disease Progression Prevented or Minimized   Start Date: 11/17/2020  Expected End Date: 11/17/2021  This Visit's Progress: On track  Priority: Medium  Note:   Objective:  Lab Results  Component Value Date   HGBA1C 6.2 (H) 09/26/2020   Lab Results  Component Value Date   CREATININE 0.71 09/26/2020   CREATININE 0.76 06/23/2020   CREATININE 0.76 03/23/2020   Lab Results  Component Value Date   EGFR 88 09/26/2020   Current Barriers:  Knowledge Deficits related to basic Diabetes pathophysiology and self care/management Knowledge Deficits related to medications used for management of diabetes Cognitive Deficits Does not use cbg meter  Case Manager Clinical Goal(s):  Patient will demonstrate improved adherence to prescribed treatment plan for diabetes self care/management as evidenced by: adherence to ADA/ carb modified diet adherence to prescribed medication regimen contacting provider for new or worsened symptoms or questions Interventions:  11/17/20 completed successful outbound call with son [name]  Collaboration with Glendale Chard, MD regarding development and update of comprehensive plan of care as evidenced by provider attestation and co-signature Inter-disciplinary care team collaboration (see longitudinal plan of care) Provided education to patient about basic DM disease process Review of patient status, including review of consultants reports, relevant laboratory and other test results, and medications  completed. Reviewed medications with patient and discussed importance of medication adherence Provided patient with written educational materials related to hypo and hyperglycemia and importance of correct treatment; Low Carb High Protein Smoothies: Meal Planning using the Plate Methods  Discussed plans with patient for ongoing care management follow up and provided patient with direct contact information for care management team Self-Care Activities - Unable to self administer medications as prescribed - Does not contact provider office for questions/concerns Patient Goals: - keep appointment with eye doctor - schedule appointment with eye doctor - check feet daily for cuts, sores or redness - do heel pump exercise 2 to 3 times each day - keep feet up while sitting - trim toenails straight across - wash and dry feet carefully every day - wear comfortable, cotton socks - wear comfortable, well-fitting shoes  Follow Up Plan: Telephone follow up appointment with care management team member scheduled for: 12/16/20    Patient Care Plan: Hypertension (Adult)     Problem Identified: Hypertension (Hypertension)   Priority: High     Long-Range Goal: Hypertension Monitored   Start Date: 11/17/2020  Expected End Date: 11/17/2021  This Visit's Progress: On track  Priority: High  Note:   Objective:  Last practice recorded BP readings:  BP Readings from Last 3 Encounters:  11/11/20 (!) 199/84  11/01/20 132/80  10/18/20 134/78   Most recent  eGFR/CrCl:  Lab Results  Component Value Date   EGFR 88 09/26/2020    No components found for: CRCL Current Barriers:  Knowledge Deficits related to basic understanding of hypertension pathophysiology and self care management Knowledge Deficits related to understanding of medications prescribed for management of hypertension Cognitive Deficits Case Manager Clinical Goal(s):  patient will demonstrate improved adherence to prescribed treatment plan  for hypertension as evidenced by taking all medications as prescribed, monitoring and recording blood pressure as directed, adhering to low sodium/DASH diet Interventions:  11/17/20 completed successful outbound call with son [name] Collaboration with Glendale Chard, MD regarding development and update of comprehensive plan of care as evidenced by provider attestation and co-signature Inter-disciplinary care team collaboration (see longitudinal plan of care) Evaluation of current treatment plan related to hypertension self management and patient's adherence to plan as established by provider. Provided education to patient re: stroke prevention, s/s of heart attack and stroke, DASH diet, complications of uncontrolled blood pressure Reviewed medications with patient and discussed importance of compliance Advised patient, providing education and rationale, to monitor blood pressure daily and record, calling PCP for findings outside established parameters.  Discussed plans with patient for ongoing care management follow up and provided patient with direct contact information for care management team Self-Care Activities: - Unable to self administer medications as prescribed Does not contact provider office for questions/concerns Patient Goals: - learn about high blood pressure  Follow Up Plan: Telephone follow up appointment with care management team member scheduled for:  12/16/20     Goals Addressed      COMPLETED: Assist with Chronic Care Management and Care Coordination needs       Timeframe:  Short-Term Goal Priority:  High Start Date:  09/29/20                          Expected End Date:  11/10/20   Patient Goals/Self-Care Activities patient will:   - Patient will work with the CCM team to address chronic care management and care coordination needs and will continue to work with the clinical team to address health care and disease management related needs.   Follow Up Plan: The care  management team will reach out to the patient again over the next 30 days.                           Dementia - Harm or Injury Prevented   On track    Timeframe:  Long-Range Goal Priority:  High Start Date:  11/17/20                           Expected End Date:  7/723  Next Scheduled Follow up: 12/16/20    Self Care Activities:  Unable to self administer medications as prescribed Does not contact provider office for questions/concer Patient Goals: - engage with embedded BSW regarding PACE and MOW  - follow up on patient's Medicaid application status         - have a family discussion about how the patient's medication administration will be supervised to ensure medications are taken as prescribed              Diabetes - Disease Progression Prevented or Minimized   On track    Timeframe:  Long-Range Goal Priority:  Medium Start Date:  11/17/20  Expected End Date:  11/17/21  Next Scheduled Follow up date: 12/16/20       Self-Care Activities - Unable to self administer medications as prescribed - Does not contact provider office for questions/concerns Patient Goals: - keep appointment with eye doctor - schedule appointment with eye doctor - check feet daily for cuts, sores or redness - do heel pump exercise 2 to 3 times each day - keep feet up while sitting - trim toenails straight across - wash and dry feet carefully every day - wear comfortable, cotton socks - wear comfortable, well-fitting shoes                    Medication Adherence Maintained   On track    Timeframe:  Long-Range Goal Priority:  High Start Date:  11/17/20                           Expected End Date:  11/17/21  Next Scheduled Follow up: 12/16/20                        Track and Manage My Blood Pressure-Hypertension   On track    Timeframe:  Long-Range Goal Priority:  High Start Date:  11/17/20                           Expected End Date: 11/17/21                      Follow Up Date 12/16/20    Self-Care Activities: - Unable to self administer medications as prescribed - Does not contact provider office for questions/concerns Patient Goals: - learn about high blood pressure   Why is this important?   You won't feel high blood pressure, but it can still hurt your blood vessels.  High blood pressure can cause heart or kidney problems. It can also cause a stroke.  Making lifestyle changes like losing a Kelsy Polack weight or eating less salt will help.  Checking your blood pressure at home and at different times of the day can help to control blood pressure.  If the doctor prescribes medicine remember to take it the way the doctor ordered.  Call the office if you cannot afford the medicine or if there are questions about it.     Notes:

## 2020-11-24 NOTE — Chronic Care Management (AMB) (Signed)
Chronic Care Management   CCM RN Visit Note  11/17/2020 Name: Pamela Key MRN: [id number] DOB: 28-May-1940  Subjective: Pamela Key is a 80 y.o. year old female who is a primary care patient of Pamela Chard, MD. The care management team was consulted for assistance with disease management and care coordination needs.    Engaged with patient by telephone for follow up visit in response to provider referral for case management and/or care coordination services.   Consent to Services:  The patient was given information about Chronic Care Management services, agreed to services, and gave verbal consent prior to initiation of services.  Please see initial visit note for detailed documentation.   Patient agreed to services and verbal consent obtained.   Assessment: Review of patient past medical history, allergies, medications, health status, including review of consultants reports, laboratory and other test data, was performed as part of comprehensive evaluation and provision of chronic care management services.   SDOH (Social Determinants of Health) assessments and interventions performed:  Yes, see care plan   CCM Care Plan  No Known Allergies  Outpatient Encounter Medications as of 11/17/2020  Medication Sig   allopurinol (ZYLOPRIM) 100 MG tablet TAKE 1 TABLET BY MOUTH EVERY DAY   colchicine 0.6 MG tablet Take 1 tablet (0.6 mg total) by mouth daily for 5 days. (Patient not taking: No sig reported)   ferrous sulfate 325 (65 FE) MG tablet Take 325 mg by mouth See admin instructions. Take one tablet (325 mg) by mouth daily Monday thru Friday (skip Saturday and Sunday)   Misc Natural Products (BLOOD SUGAR BALANCE PO) Take 1 capsule by mouth daily.    Multiple Vitamin (MULTIVITAMIN WITH MINERALS) TABS tablet Take 1 tablet by mouth daily with breakfast.   NON FORMULARY daily as needed. CBD capsules Pure organic hemp extract   pravastatin (PRAVACHOL) 40 MG tablet TAKE 1 TABLET BY  MOUTH MONDAY - FRIDAY   valsartan (DIOVAN) 160 MG tablet Take 1 tablet (160 mg total) by mouth daily.   Facility-Administered Encounter Medications as of 11/17/2020  Medication   cyanocobalamin ((VITAMIN B-12)) injection 1,000 mcg    Patient Active Problem List   Diagnosis Date Noted   Pure hypercholesterolemia 06/17/2018   Morbid obesity ([place]) 06/17/2018   Type II diabetes mellitus, uncontrolled ([place])    Benign hypertension with chronic kidney disease, stage II 03/01/2018    Conditions to be addressed/monitored: Type 2 diabetes, Hypertensive nephropathy, Memory deficit, Vitamin B 12 deficiency   Care Plan : Dementia (Adult)  Updates made by Pamela Logan, RN since 11/17/2020 12:00 AM     Problem: Harm or Injury   Priority: High     Long-Range Goal: Harm or Injury Prevented   Start Date: 11/17/2020  Expected End Date: 11/17/2021  This Visit's Progress: On track  Priority: High  Note:   Current Barriers:  Ineffective Self Health Maintenance in a patient with Dementia Clinical Goal(s):  Collaboration with Pamela Chard, MD regarding development and update of comprehensive plan of care as evidenced by provider attestation and co-signature Inter-disciplinary care team collaboration (see longitudinal plan of care) patient will work with care management team to address care coordination and chronic disease management needs related to Disease Management Educational Needs Care Coordination Medication Management and Education Medication Reconciliation Psychosocial Support Dementia and Caregiver Support   Interventions:  11/17/20 completed successful call with patient's son Pamela Key Evaluation of current treatment plan related to Dementia, self-management and patient's adherence to plan as established by  provider. Collaboration with Pamela Chard, MD regarding development and update of comprehensive plan of care as evidenced by provider attestation       and  co-signature Inter-disciplinary care team collaboration (see longitudinal plan of care) Determined patient is currently living with her grandson while her home is being renovated and cleaned from bed bugs  Discussed patient's adult children live out of state but plan to stay in close communication with their mother once she is back in her home Determined son Pamela Key and daughter Pamela Key are her primary caregivers, however they live out of state and will be monitoring her remotely Discussed they are interested in enrolling their mom in the Pamela Key program and are currently engaged with the embedded BSW  Provided education to patient about basic disease process related to Dementia and what to expect  Review of patient status, including review of consultant's reports, relevant laboratory and other test results, and medications completed. Reviewed medications with patient and discussed importance of medication adherence Determined the patient misses some of her medications due to forgetting to take them; Educated on options to help reinforce the patient is taking her medications exactly as prescribed and determined a pill box is needed as well as possible setting alarms for the patient as a reminder, having the family call to instructed her when meds are due and having her grandson who lives local to stop in and supervise the patient taking her medications  Discussed patient completed an initial Neurological consultation with Pamela Key on 11/11/20 for evaluation of Dementia with MRI results pending  Assessed for nutritional concerns, discussed the family is concerned the patient forgets to eat; Educated on Pamela Key and how this program works Theme park manager with embedded Pamela Key who will follow up on previously submitted Pamela Key application for patient  Discussed plans with patient for ongoing care management follow up and provided patient with direct contact information for care management team Self Care  Activities:  Unable to self administer medications as prescribed Does not contact provider office for questions/concer Patient Goals: - engage with embedded BSW regarding Pamela Key and Pamela Key  - follow up on patient's Medicaid application status  - have a family discussion about how the patient's medication administration will be supervised to ensure medications are taken as prescribed        Follow Up Plan: Telephone follow up appointment with care management team member scheduled for: 12/16/20    Care Plan : Wellness (Adult)  Updates made by Pamela Logan, RN since 11/17/2020 12:00 AM     Problem: Medication Adherence (Wellness)   Priority: High     Long-Range Goal: Medication Adherence Maintained   Start Date: 11/17/2020  Expected End Date: 11/17/2021  This Visit's Progress: On track  Priority: High  Note:   Current Barriers:  Ineffective Self Health Maintenance in a patient with Type 2 diabetes, Hypertensive nephropathy, Memory deficit, Vitamin B 12 deficiency  Clinical Goal(s):  Collaboration with Pamela Chard, MD regarding development and update of comprehensive plan of care as evidenced by provider attestation and co-signature Inter-disciplinary care team collaboration (see longitudinal plan of care) patient will work with care management team to address care coordination and chronic disease management needs related to Disease Management Educational Needs Care Coordination Medication Management and Education Psychosocial Support Dementia and Caregiver Support   Interventions:  11/17/20 completed successful outbound call with son Pamela Key Evaluation of current treatment plan related to Type 2 diabetes, Hypertensive nephropathy, Memory deficit, Vitamin B 12 deficiency ,  self-management and  patient's adherence to plan as established by provider. Collaboration with Pamela Chard, MD regarding development and update of comprehensive plan of care as evidenced by provider attestation        and co-signature Inter-disciplinary care team collaboration (see longitudinal plan of care) Reviewed medications with patient and discussed importance of medication adherence Determined the patient misses some of her medications due to forgetting to take them; Educated on options to help reinforce the patient is taking her medications exactly as prescribed and determined a pill box is needed as well as possible setting alarms for the patient as a reminder, having the family call to instructed her when meds are due and having her grandson who lives local to stop in and supervise the patient taking her medications  Educated on how the Pamela Key program may be able to assist with medication administration/supervision  Discussed plans with patient for ongoing care management follow up and provided patient with direct contact information for care management team Self Care Activities:  Unable to self administer medications as prescribed Does not contact provider office for questions/concerns Patient Goals: - engage with embedded BSW regarding Pamela Key and Pamela Key  - follow up on patient's Medicaid application status  - have a family discussion about how the patient's medication administration will be supervised to ensure medications are taken as prescribed    Follow Up Plan: Telephone follow up appointment with care management team member scheduled for: 12/16/20     Care Plan : Diabetes Type 2 (Adult)  Updates made by Pamela Logan, RN since 11/17/2020 12:00 AM     Problem: Disease Progression (Diabetes, Type 2)   Priority: Medium     Long-Range Goal: Disease Progression Prevented or Minimized   Start Date: 11/17/2020  Expected End Date: 11/17/2021  This Visit's Progress: On track  Priority: Medium  Note:   Objective:  Lab Results  Component Value Date   HGBA1C 6.2 (H) 09/26/2020   Lab Results  Component Value Date   CREATININE 0.71 09/26/2020   CREATININE 0.76 06/23/2020   CREATININE 0.76 03/23/2020    Lab Results  Component Value Date   EGFR 88 09/26/2020   Current Barriers:  Knowledge Deficits related to basic Diabetes pathophysiology and self care/management Knowledge Deficits related to medications used for management of diabetes Cognitive Deficits Does not use cbg meter  Case Manager Clinical Goal(s):  Patient will demonstrate improved adherence to prescribed treatment plan for diabetes self care/management as evidenced by: adherence to ADA/ carb modified diet adherence to prescribed medication regimen contacting provider for new or worsened symptoms or questions Interventions:  11/17/20 completed successful outbound call with son Pamela Key  Collaboration with Pamela Chard, MD regarding development and update of comprehensive plan of care as evidenced by provider attestation and co-signature Inter-disciplinary care team collaboration (see longitudinal plan of care) Provided education to patient about basic DM disease process Review of patient status, including review of consultants reports, relevant laboratory and other test results, and medications completed. Reviewed medications with patient and discussed importance of medication adherence Provided patient with written educational materials related to hypo and hyperglycemia and importance of correct treatment; Low Carb High Protein Smoothies: Meal Planning using the Plate Methods  Discussed plans with patient for ongoing care management follow up and provided patient with direct contact information for care management team Self-Care Activities - Unable to self administer medications as prescribed - Does not contact provider office for questions/concerns Patient Goals: - keep appointment with eye doctor - schedule appointment with eye  doctor - check feet daily for cuts, sores or redness - do heel pump exercise 2 to 3 times each day - keep feet up while sitting - trim toenails straight across - wash and dry feet carefully every  day - wear comfortable, cotton socks - wear comfortable, well-fitting shoes  Follow Up Plan: Telephone follow up appointment with care management team member scheduled for: 12/16/20    Care Plan : Hypertension (Adult)  Updates made by Pamela Logan, RN since 11/17/2020 12:00 AM     Problem: Hypertension (Hypertension)   Priority: High     Long-Range Goal: Hypertension Monitored   Start Date: 11/17/2020  Expected End Date: 11/17/2021  This Visit's Progress: On track  Priority: High  Note:   Objective:  Last practice recorded BP readings:  BP Readings from Last 3 Encounters:  11/11/20 (!) 199/84  11/01/20 132/80  10/18/20 134/78   Most recent eGFR/CrCl:  Lab Results  Component Value Date   EGFR 88 09/26/2020    No components found for: CRCL Current Barriers:  Knowledge Deficits related to basic understanding of hypertension pathophysiology and self care management Knowledge Deficits related to understanding of medications prescribed for management of hypertension Cognitive Deficits Case Manager Clinical Goal(s):  patient will demonstrate improved adherence to prescribed treatment plan for hypertension as evidenced by taking all medications as prescribed, monitoring and recording blood pressure as directed, adhering to low sodium/DASH diet Interventions:  11/17/20 completed successful outbound call with son Pamela Key Collaboration with Pamela Chard, MD regarding development and update of comprehensive plan of care as evidenced by provider attestation and co-signature Inter-disciplinary care team collaboration (see longitudinal plan of care) Evaluation of current treatment plan related to hypertension self management and patient's adherence to plan as established by provider. Provided education to patient re: stroke prevention, s/s of heart attack and stroke, DASH diet, complications of uncontrolled blood pressure Reviewed medications with patient and discussed importance of  compliance Advised patient, providing education and rationale, to monitor blood pressure daily and record, calling PCP for findings outside established parameters.  Discussed plans with patient for ongoing care management follow up and provided patient with direct contact information for care management team Self-Care Activities: - Unable to self administer medications as prescribed Does not contact provider office for questions/concerns Patient Goals: - learn about high blood pressure  Follow Up Plan: Telephone follow up appointment with care management team member scheduled for:  12/16/20    Plan:Telephone follow up appointment with care management team member scheduled for:  12/16/20  Barb Merino, RN, BSN, CCM Care Management Coordinator [facility]  Direct Phone: [phone]

## 2020-12-09 ENCOUNTER — Telehealth: Payer: Self-pay

## 2020-12-09 NOTE — Telephone Encounter (Signed)
Transition Care Management Unsuccessful Follow-up Telephone Call  Date of discharge and from where:  12/05/2020  Attempts:  1st Attempt  Reason for unsuccessful TCM follow-up call:  Left voice message

## 2020-12-12 ENCOUNTER — Ambulatory Visit: Payer: Medicare Other | Admitting: Internal Medicine

## 2020-12-13 ENCOUNTER — Encounter: Payer: Self-pay | Admitting: Internal Medicine

## 2020-12-13 ENCOUNTER — Ambulatory Visit (INDEPENDENT_AMBULATORY_CARE_PROVIDER_SITE_OTHER): Payer: Self-pay | Admitting: Internal Medicine

## 2020-12-13 ENCOUNTER — Other Ambulatory Visit: Payer: Self-pay

## 2020-12-13 VITALS — BP 136/72 | HR 63 | Temp 98.4°F | Ht 62.0 in | Wt 182.6 lb

## 2020-12-13 DIAGNOSIS — E6609 Other obesity due to excess calories: Secondary | ICD-10-CM

## 2020-12-13 DIAGNOSIS — Z6833 Body mass index (BMI) 33.0-33.9, adult: Secondary | ICD-10-CM

## 2020-12-13 DIAGNOSIS — N182 Chronic kidney disease, stage 2 (mild): Secondary | ICD-10-CM

## 2020-12-13 DIAGNOSIS — I129 Hypertensive chronic kidney disease with stage 1 through stage 4 chronic kidney disease, or unspecified chronic kidney disease: Secondary | ICD-10-CM

## 2020-12-13 DIAGNOSIS — R413 Other amnesia: Secondary | ICD-10-CM

## 2020-12-13 DIAGNOSIS — E1122 Type 2 diabetes mellitus with diabetic chronic kidney disease: Secondary | ICD-10-CM

## 2020-12-13 DIAGNOSIS — E538 Deficiency of other specified B group vitamins: Secondary | ICD-10-CM

## 2020-12-13 MED ORDER — CYANOCOBALAMIN 1000 MCG/ML IJ SOLN
1000.0000 ug | Freq: Once | INTRAMUSCULAR | Status: AC
  Administered 2020-12-13: 1000 ug via INTRAMUSCULAR

## 2020-12-13 MED ORDER — VALSARTAN 160 MG PO TABS: 160.0000 mg | ORAL_TABLET | Freq: Every day | ORAL | 1 refills | Status: DC

## 2020-12-13 MED ORDER — PRAVASTATIN SODIUM 40 MG PO TABS: ORAL_TABLET | ORAL | 1 refills | Status: DC

## 2020-12-13 NOTE — Patient Instructions (Signed)
Vitamin B12 Deficiency Vitamin B12 deficiency means that your body does not have enough vitamin B12. The body needs this vitamin: To make red blood cells. To make genes (DNA). To help the nerves work. If you do not have enough vitamin B12 in your body, you can have health problems. What are the causes? Not eating enough foods that contain vitamin B12. Not being able to absorb vitamin B12 from the food that you eat. Certain digestive system diseases. A condition in which the body does not make enough of a certain protein, which results in too few red blood cells (pernicious anemia). Having a surgery in which part of the stomach or small intestine is removed. Taking medicines that make it hard for the body to absorb vitamin B12. These medicines include: Heartburn medicines. Some antibiotic medicines. Other medicines that are used to treat certain conditions. What increases the risk? Being older than age 50. Eating a vegetarian or vegan diet, especially while you are pregnant. Eating a poor diet while you are pregnant. Taking certain medicines. Having alcoholism. What are the signs or symptoms? In some cases, there are no symptoms. If the condition leads to too few blood cells or nerve damage, symptoms can occur, such as: Feeling weak. Feeling tired (fatigued). Not being hungry. Weight loss. A loss of feeling (numbness) or tingling in your hands and feet. Redness and burning of the tongue. Being mixed up (confused) or having memory problems. Sadness (depression). Problems with your senses. This can include color blindness, ringing in the ears, or loss of taste. Watery poop (diarrhea) or trouble pooping (constipation). Trouble walking. If anemia is very bad, symptoms can include: Being short of breath. Being dizzy. Having a very fast heartbeat. How is this treated? Changing the way you eat and drink, such as: Eating more foods that contain vitamin B12. Drinking little or no  alcohol. Getting vitamin B12 shots. Taking vitamin B12 supplements. Your doctor will tell you the dose that is best for you. Follow these instructions at home: Eating and drinking  Eat lots of healthy foods that contain vitamin B12. These include: Meats and poultry, such as beef, pork, chicken, turkey, and organ meats, such as liver. Seafood, such as clams, rainbow trout, salmon, tuna, and haddock. Eggs. Cereal and dairy products that have vitamin B12 added to them. Check the label. The items listed above may not be a complete list of what you can eat and drink. Contact a dietitian for more options. General instructions Get any shots as told by your doctor. Take supplements only as told by your doctor. Do not drink alcohol if your doctor tells you not to. In some cases, you may only be asked to limit alcohol use. Keep all follow-up visits as told by your doctor. This is important. Contact a doctor if: Your symptoms come back. Get help right away if: You have trouble breathing. You have a very fast heartbeat. You have chest pain. You get dizzy. You pass out. Summary Vitamin B12 deficiency means that your body is not getting enough vitamin B12. In some cases, there are no symptoms of this condition. Treatment may include making a change in the way you eat and drink, getting vitamin B12 shots, or taking supplements. Eat lots of healthy foods that contain vitamin B12. This information is not intended to replace advice given to you by your health care provider. Make sure you discuss any questions you have with your health care provider. Document Revised: 01/07/2018 Document Reviewed: 01/07/2018 Elsevier Patient Education    2022 [employer]  

## 2020-12-13 NOTE — Progress Notes (Signed)
I,Tianna Badgett,acting as a Education administrator for [name], MD.,have documented all relevant documentation on the behalf of [name], MD,as directed by  [name], MD while in the presence of [name], MD.  This visit occurred during the SARS-CoV-2 public health emergency.  Safety protocols were in place, including screening questions prior to the visit, additional usage of staff PPE, and extensive cleaning of exam room while observing appropriate contact time as indicated for disinfecting solutions.  Subjective:     Patient ID: Pamela Key , female    DOB: 07/23/1940 , 80 y.o.   MRN: [id number]   Chief Complaint  Patient presents with   Hypertension   Diabetes    HPI  She presents today for diabetes/BP check and b12 follow up. She has not come to any nurse visits for a b12 injection  Hypertension This is a chronic problem. The current episode started more than 1 year ago. The problem has been gradually improving since onset. The problem is controlled. Pertinent negatives include no blurred vision or chest pain. Risk factors for coronary artery disease include diabetes mellitus, dyslipidemia, post-menopausal state, obesity and sedentary lifestyle. Past treatments include ACE inhibitors. The current treatment provides moderate improvement. Compliance problems include exercise.  Hypertensive end-organ damage includes kidney disease.  Diabetes She presents for her follow-up diabetic visit. She has type 2 diabetes mellitus. Her disease course has been stable. There are no hypoglycemic associated symptoms. There are no diabetic associated symptoms. Pertinent negatives for diabetes include no blurred vision and no chest pain. There are no hypoglycemic complications. There are no diabetic complications. Risk factors for coronary artery disease include diabetes mellitus, dyslipidemia and hypertension. She is following a generally healthy diet. She participates in exercise  intermittently. Her home blood glucose trend is fluctuating minimally. Her breakfast blood glucose is taken between 8-9 am. Her breakfast blood glucose range is generally 90-110 mg/dl. An ACE inhibitor/angiotensin II receptor blocker is being taken. She does not see a podiatrist.Eye exam is not current.    Past Medical History:  Diagnosis Date   Diabetes mellitus without complication ([place])    Gout    High cholesterol    Hypertension      Family History  Problem Relation Age of Onset   Diabetes Mother    Hypertension Mother    Cancer Father    Hypertension Father      Current Outpatient Medications:    allopurinol (ZYLOPRIM) 100 MG tablet, TAKE 1 TABLET BY MOUTH EVERY DAY, Disp: 90 tablet, Rfl: 1   ferrous sulfate 325 (65 FE) MG tablet, Take 325 mg by mouth See admin instructions. Take one tablet (325 mg) by mouth daily Monday thru Friday (skip Saturday and Sunday), Disp: , Rfl:    Misc Natural Products (BLOOD SUGAR BALANCE PO), Take 1 capsule by mouth daily. , Disp: , Rfl:    Multiple Vitamin (MULTIVITAMIN WITH MINERALS) TABS tablet, Take 1 tablet by mouth daily with breakfast., Disp: , Rfl:    NON FORMULARY, daily as needed. CBD capsules Pure organic hemp extract, Disp: , Rfl:    pravastatin (PRAVACHOL) 40 MG tablet, One tab po M-F and skip Saturday/Sundays, Disp: 75 tablet, Rfl: 1   valsartan (DIOVAN) 160 MG tablet, Take 1 tablet (160 mg total) by mouth daily., Disp: 90 tablet, Rfl: 1  Current Facility-Administered Medications:    cyanocobalamin ((VITAMIN B-12)) injection 1,000 mcg, 1,000 mcg, Intramuscular, Once, Glendale Chard, MD   No Known Allergies   Review of  Systems  Constitutional: Negative.   Eyes:  Negative for blurred vision.  Respiratory: Negative.    Cardiovascular: Negative.  Negative for chest pain.  Gastrointestinal: Negative.   Neurological: Negative.     Today's Vitals   12/13/20 1028  BP: 136/72  Pulse: 63  Temp: 98.4 F (36.9 C)  TempSrc: Oral   Weight: 182 lb 9.6 oz (82.8 kg)  Height: 5' 2"  (1.575 m)   Body mass index is 33.4 kg/m.  Wt Readings from Last 3 Encounters:  12/24/20 182 lb 15.7 oz (83 kg)  12/13/20 182 lb 9.6 oz (82.8 kg)  11/11/20 179 lb 9.6 oz (81.5 kg)    Objective:  Physical Exam Vitals and nursing note reviewed.  Constitutional:      Appearance: Normal appearance.  HENT:     Head: Normocephalic and atraumatic.     Nose:     Comments: Masked     Mouth/Throat:     Comments: Masked  Cardiovascular:     Rate and Rhythm: Normal rate and regular rhythm.     Heart sounds: Normal heart sounds.  Pulmonary:     Effort: Pulmonary effort is normal.     Breath sounds: Normal breath sounds.  Skin:    General: Skin is warm.  Neurological:     Mental Status: She is alert. Mental status is at baseline.  Psychiatric:        Mood and Affect: Mood normal.        Behavior: Behavior normal.        Assessment And Plan:     1. Hypertensive nephropathy Comments: Chronic, fair control. Advised to avoid adding salt to her foods.  I will check renal function.   2. Type 2 diabetes mellitus with stage 2 chronic kidney disease, without long-term current use of insulin ([place]) Comments: Currently off of meds. I will check an a1c today. Encouraged to limit intake of sugary beverages.  - BMP8+eGFR - Hemoglobin A1c  3. B12 deficiency Comments: She was given vitamin B12 IM  x1.  - cyanocobalamin ((VITAMIN B-12)) injection 1,000 mcg  4. Memory deficit - Ambulatory referral to [place]  5. Class 1 obesity due to excess calories with serious comorbidity and body mass index (BMI) of 33.0 to 33.9 in adult  She is encouraged to strive for BMI less than 30 to decrease cardiac risk. Advised to aim for at least 150 minutes of exercise per week.  Patient was given opportunity to ask questions. Patient verbalized understanding of the plan and was able to repeat key elements of the plan. All questions were answered to their  satisfaction.   I, [name], MD, have reviewed all documentation for this visit. The documentation on 12/26/20 for the exam, diagnosis, procedures, and orders are all accurate and complete.   IF YOU HAVE BEEN REFERRED TO A SPECIALIST, IT MAY TAKE 1-2 WEEKS TO SCHEDULE/PROCESS THE REFERRAL. IF YOU HAVE NOT HEARD FROM US/SPECIALIST IN TWO WEEKS, PLEASE GIVE [place] A CALL AT [phone].   THE PATIENT IS ENCOURAGED TO PRACTICE SOCIAL DISTANCING DUE TO THE COVID-19 PANDEMIC.

## 2020-12-14 LAB — BMP8+EGFR
BUN/Creatinine Ratio: 13 (ref 12–28)
BUN: 9 mg/dL (ref 8–27)
CO2: 28 mmol/L (ref 20–29)
Calcium: 9.4 mg/dL (ref 8.7–10.3)
Chloride: 104 mmol/L (ref 96–106)
Creatinine, Ser: 0.72 mg/dL (ref 0.57–1.00)
Glucose: 95 mg/dL (ref 65–99)
Potassium: 4.5 mmol/L (ref 3.5–5.2)
Sodium: 143 mmol/L (ref 134–144)
eGFR: 86 mL/min/{1.73_m2} (ref >59)

## 2020-12-14 LAB — HEMOGLOBIN A1C
Est. average glucose Bld gHb Est-mCnc: 131 mg/dL
Hgb A1c MFr Bld: 6.2 % — ABNORMAL HIGH (ref 4.8–5.6)

## 2020-12-16 ENCOUNTER — Telehealth: Payer: Medicare Other

## 2020-12-22 ENCOUNTER — Ambulatory Visit (INDEPENDENT_AMBULATORY_CARE_PROVIDER_SITE_OTHER): Payer: Medicare Other

## 2020-12-22 DIAGNOSIS — I129 Hypertensive chronic kidney disease with stage 1 through stage 4 chronic kidney disease, or unspecified chronic kidney disease: Secondary | ICD-10-CM

## 2020-12-22 DIAGNOSIS — N182 Chronic kidney disease, stage 2 (mild): Secondary | ICD-10-CM

## 2020-12-22 DIAGNOSIS — R413 Other amnesia: Secondary | ICD-10-CM

## 2020-12-22 DIAGNOSIS — E1122 Type 2 diabetes mellitus with diabetic chronic kidney disease: Secondary | ICD-10-CM | POA: Diagnosis not present

## 2020-12-22 NOTE — Patient Instructions (Signed)
Social Worker Visit Information  Goals we discussed today:   Goals Addressed             This Visit's Progress    Home and Family Safety Maintained       Timeframe:  Long-Range Goal Priority:  High Start Date:  5.19.22                           Expected End Date: 8.17.22                       Next planned outreach: 9.26.22  Patient Goals/Self-Care Activities patient will: with the help of her family - Engage with [facility] regarding enrollment -Contact SW as needed prior to next scheduled call     COMPLETED: Place patient on MOW wait list       Timeframe:  Short-Term Goal Priority:  High Start Date: 7.7.22                            Expected End Date: 8.6.22                       Patient Goals/Self-Care Activities patient will: With the help of her daughter  - Aeronautical engineer with [employer] mobile meals program -Contact SW as needed         Materials Provided: No: Patient declined  Follow Up Plan: SW will follow up with patient by phone over the next 60 days   [name], BSW, CDP Social Worker, Certified Dementia Practitioner [facility] [phone]

## 2020-12-22 NOTE — Chronic Care Management (AMB) (Signed)
Chronic Care Management    Social Work Note  12/22/2020 Name: Pamela Key MRN: [id number] DOB: 05/19/1940  Pamela Key is a 80 y.o. year old female who is a primary care patient of Glendale Chard, MD. The CCM team was consulted to assist the patient with chronic disease management and/or care coordination needs related to: [employer] .   Engaged with patients daughter Karie Schwalbe by phone  for follow up visit in response to provider referral for social work chronic care management and care coordination services.   Consent to Services:  The patient was given information about Chronic Care Management services, agreed to services, and gave verbal consent prior to initiation of services.  Please see initial visit note for detailed documentation.   Patient agreed to services and consent obtained.   Assessment: Review of patient past medical history, allergies, medications, and health status, including review of relevant consultants reports was performed today as part of a comprehensive evaluation and provision of chronic care management and care coordination services.     SDOH (Social Determinants of Health) assessments and interventions performed:    Advanced Directives Status: Not addressed in this encounter.  CCM Care Plan  No Known Allergies  Outpatient Encounter Medications as of 12/22/2020  Medication Sig   allopurinol (ZYLOPRIM) 100 MG tablet TAKE 1 TABLET BY MOUTH EVERY DAY   ferrous sulfate 325 (65 FE) MG tablet Take 325 mg by mouth See admin instructions. Take one tablet (325 mg) by mouth daily Monday thru Friday (skip Saturday and Sunday)   Misc Natural Products (BLOOD SUGAR BALANCE PO) Take 1 capsule by mouth daily.    Multiple Vitamin (MULTIVITAMIN WITH MINERALS) TABS tablet Take 1 tablet by mouth daily with breakfast.   NON FORMULARY daily as needed. CBD capsules Pure organic hemp extract   pravastatin (PRAVACHOL) 40 MG tablet One tab po M-F and skip  Saturday/Sundays   valsartan (DIOVAN) 160 MG tablet Take 1 tablet (160 mg total) by mouth daily.   Facility-Administered Encounter Medications as of 12/22/2020  Medication   cyanocobalamin ((VITAMIN B-12)) injection 1,000 mcg    Patient Active Problem List   Diagnosis Date Noted   Pure hypercholesterolemia 06/17/2018   Type II diabetes mellitus, uncontrolled ([place])    Benign hypertension with chronic kidney disease, stage II 03/01/2018    Conditions to be addressed/monitored:  DM II, Hypertensive Nephropathy, and Memory Deficit ; Limited access to caregiver  Care Plan : Social Work [facility] Care Plan  Updates made by [name] since 12/22/2020 12:00 AM     Problem: Safety - family concerned pt has dementia      Long-Range Goal: Home and Family Safety Maintained   Start Date: 09/29/2020  Expected End Date: 12/28/2020  Recent Progress: On track  Priority: High  Note:   Current Barriers:  Chronic disease management support and education needs related to HTN and DM  Limited support system Cognitive Decline  Social Worker Clinical Goal(s):  Over the next 90 days the patient and her daughter will work with SW to identify resources to assist with patient care needs Over the next 10 days the patient and her children will follow up on status of Medicaid application as directed by SW -Goal Met Over the next 30 days the patient will schedule an appointment with neurology -Goal Met SW Interventions:  Inter-disciplinary care team collaboration (see longitudinal plan of care) Collaboration with Glendale Chard, MD regarding development and update of comprehensive plan of care as evidenced by  provider attestation and co-signature Successful outbound call placed to the patients daughter Pamela Key to assess goal progression Discussed the patient is currently living with her grand-daughter while awaiting on her home to be ready. Pamela Key indicates there were a few set backs with repairs but she is  hoping the patient will be moved back in within the next 3 weeks Determined the patients son is actively working with [facility] to complete enrollment application. The patient will not be assessed for services until she has moved into her home Assessed for acute care coordination needs - no challenges identified at this time Discussed plans for SW to outreach over the next 45-60 days to assess progression of [facility] enrollment  Patient Goals/Self-Care Activities patient will: with the help of her family - Engage with [facility] regarding enrollment -Contact SW as needed prior to next scheduled call  Follow Up Plan:  SW will follow up with the patients daughter over the next 60 days     Problem: Healthy Nutrition (Wellness) Resolved 12/22/2020     Goal: Place patient on MOW wait list Completed 12/22/2020  Start Date: 11/17/2020  Expected End Date: 12/17/2020  Recent Progress: On track  Priority: High  Note:   Current Barriers:  Chronic disease management support and education needs related to  DM II, Hypertensive Nephropathy, and Memory Deficit   Inability to prepare own meals  Social Worker Clinical Goal(s):  Patient with the help of her children will work with SW to be placed on mobile meals wait list  SW Interventions:  Inter-disciplinary care team collaboration (see longitudinal plan of care) Collaboration with Glendale Chard, MD regarding development and update of comprehensive plan of care as evidenced by provider attestation and co-signature Successful outbound call placed to the patients daughter Pamela Key to assess goal progression Determined she has spoken with a representative from mobile meals who indicates patient has been placed on the wait list and will receive meals in a few months Educated [name] on opportunity to purchase [employer] to be delivered to the patients home [place] declined information on [employer] as her daughter, the patients grand-daughter, is assisting with  meals at this time Goal Closed  Patient Goals/Self-Care Activities patient will: With the help of her daughter  -  Museum/gallery conservator with [employer] mobile meals program -Contact SW as needed        Follow Up Plan: SW will follow up with patient by phone over the next 60 days      [name], BSW, CDP Social Worker, Certified Dementia Practitioner [place] [facility] [place] [facility] [phone]

## 2020-12-24 ENCOUNTER — Other Ambulatory Visit: Payer: Self-pay

## 2020-12-24 ENCOUNTER — Emergency Department (HOSPITAL_COMMUNITY)
Admission: EM | Admit: 2020-12-24 | Discharge: 2020-12-24 | Disposition: A | Discharge status: Home / Self Care | Payer: BC Managed Care – PPO | Attending: Emergency Medicine | Admitting: Emergency Medicine

## 2020-12-24 DIAGNOSIS — E1169 Type 2 diabetes mellitus with other specified complication: Secondary | ICD-10-CM | POA: Diagnosis not present

## 2020-12-24 DIAGNOSIS — Z79899 Other long term (current) drug therapy: Secondary | ICD-10-CM | POA: Insufficient documentation

## 2020-12-24 DIAGNOSIS — R55 Syncope and collapse: Secondary | ICD-10-CM | POA: Insufficient documentation

## 2020-12-24 DIAGNOSIS — E7800 Pure hypercholesterolemia, unspecified: Secondary | ICD-10-CM | POA: Diagnosis not present

## 2020-12-24 DIAGNOSIS — I129 Hypertensive chronic kidney disease with stage 1 through stage 4 chronic kidney disease, or unspecified chronic kidney disease: Secondary | ICD-10-CM | POA: Diagnosis not present

## 2020-12-24 DIAGNOSIS — Y900 Blood alcohol level of less than 20 mg/100 ml: Secondary | ICD-10-CM | POA: Diagnosis not present

## 2020-12-24 DIAGNOSIS — Z87891 Personal history of nicotine dependence: Secondary | ICD-10-CM | POA: Insufficient documentation

## 2020-12-24 DIAGNOSIS — E860 Dehydration: Secondary | ICD-10-CM | POA: Diagnosis not present

## 2020-12-24 DIAGNOSIS — N182 Chronic kidney disease, stage 2 (mild): Secondary | ICD-10-CM | POA: Diagnosis not present

## 2020-12-24 DIAGNOSIS — E1122 Type 2 diabetes mellitus with diabetic chronic kidney disease: Secondary | ICD-10-CM | POA: Insufficient documentation

## 2020-12-24 DIAGNOSIS — R42 Dizziness and giddiness: Secondary | ICD-10-CM | POA: Diagnosis not present

## 2020-12-24 LAB — CBC WITH DIFFERENTIAL/PLATELET
Abs Immature Granulocytes: 0.01 10*3/uL (ref 0.00–0.07)
Basophils Absolute: 0 10*3/uL (ref 0.0–0.1)
Basophils Relative: 0 %
Eosinophils Absolute: 0.1 10*3/uL (ref 0.0–0.5)
Eosinophils Relative: 2 %
HCT: 39.1 % (ref 36.0–46.0)
Hemoglobin: 12.5 g/dL (ref 12.0–15.0)
Immature Granulocytes: 0 %
Lymphocytes Relative: 37 %
Lymphs Abs: 2.2 10*3/uL (ref 0.7–4.0)
MCH: 30.9 pg (ref 26.0–34.0)
MCHC: 32 g/dL (ref 30.0–36.0)
MCV: 96.5 fL (ref 80.0–100.0)
Monocytes Absolute: 0.5 10*3/uL (ref 0.1–1.0)
Monocytes Relative: 8 %
Neutro Abs: 3.2 10*3/uL (ref 1.7–7.7)
Neutrophils Relative %: 53 %
Platelets: 183 10*3/uL (ref 150–400)
RBC: 4.05 MIL/uL (ref 3.87–5.11)
RDW: 13.7 % (ref 11.5–15.5)
WBC: 6 10*3/uL (ref 4.0–10.5)
nRBC: 0 % (ref 0.0–0.2)

## 2020-12-24 LAB — COMPREHENSIVE METABOLIC PANEL
ALT: 22 U/L (ref 0–44)
AST: 26 U/L (ref 15–41)
Albumin: 4 g/dL (ref 3.5–5.0)
Alkaline Phosphatase: 52 U/L (ref 38–126)
Anion gap: 7 (ref 5–15)
BUN: 25 mg/dL — ABNORMAL HIGH (ref 8–23)
CO2: 25 mmol/L (ref 22–32)
Calcium: 8.8 mg/dL — ABNORMAL LOW (ref 8.9–10.3)
Chloride: 109 mmol/L (ref 98–111)
Creatinine, Ser: 0.93 mg/dL (ref 0.44–1.00)
GFR, Estimated: >60 mL/min (ref >60)
Glucose, Bld: 116 mg/dL — ABNORMAL HIGH (ref 70–99)
Potassium: 4.3 mmol/L (ref 3.5–5.1)
Sodium: 141 mmol/L (ref 135–145)
Total Bilirubin: 0.4 mg/dL (ref 0.3–1.2)
Total Protein: 7.4 g/dL (ref 6.5–8.1)

## 2020-12-24 LAB — URINALYSIS, ROUTINE W REFLEX MICROSCOPIC
Bilirubin Urine: NEGATIVE
Glucose, UA: NEGATIVE mg/dL
Ketones, ur: NEGATIVE mg/dL
Leukocytes,Ua: NEGATIVE
Nitrite: NEGATIVE
Protein, ur: NEGATIVE mg/dL
Specific Gravity, Urine: 1.015 (ref 1.005–1.030)
pH: 6 (ref 5.0–8.0)

## 2020-12-24 LAB — CK: Total CK: 333 U/L — ABNORMAL HIGH (ref 38–234)

## 2020-12-24 LAB — RAPID URINE DRUG SCREEN, HOSP PERFORMED
Amphetamines: NOT DETECTED
Barbiturates: NOT DETECTED
Benzodiazepines: NOT DETECTED
Cocaine: NOT DETECTED
Opiates: NOT DETECTED
Tetrahydrocannabinol: NOT DETECTED

## 2020-12-24 LAB — TROPONIN I (HIGH SENSITIVITY): Troponin I (High Sensitivity): 6 ng/L (ref <18)

## 2020-12-24 LAB — CBG MONITORING, ED: Glucose-Capillary: 117 mg/dL — ABNORMAL HIGH (ref 70–99)

## 2020-12-24 LAB — ETHANOL: Alcohol, Ethyl (B): <10 mg/dL (ref <10)

## 2020-12-24 MED ORDER — SODIUM CHLORIDE 0.9 % IV BOLUS
1000.0000 mL | Freq: Once | INTRAVENOUS | Status: AC
  Administered 2020-12-24: 1000 mL via INTRAVENOUS

## 2020-12-24 NOTE — ED Notes (Signed)
Pts family requesting a call from MD regarding patients care and discharge dispo. Family expressed to RN that pt has recently did drug and this is not normal for the pt. Family expressed they are concerned about the patients behavior and doesn't feel she needs to be discharge. MD Silverio Lay made aware.

## 2020-12-24 NOTE — Discharge Instructions (Addendum)
You likely passed out from dehydration and alcohol use  Please stay hydrated  See your doctor for follow-up  Return to ER if you pass out, chest pain, trouble breathing, palpitations

## 2020-12-24 NOTE — ED Triage Notes (Signed)
Pt BIB EMS from home c/o syncopal episode. Pt reports she was outside all day with her family, with minimal eating and drinking. Pt fell back from seated position. Denies pain and pt did not hit head. Initially hypotensive, systolic in 80s. 500 cc given by EMS PTA. BP now 118/60. 18G LAC.

## 2020-12-24 NOTE — ED Notes (Signed)
Dr. Silverio Lay allowed pt to walk to bathroom unassisted.

## 2020-12-24 NOTE — ED Provider Notes (Addendum)
[facility]-EMERGENCY DEPT Provider Note   CSN: [id number] Arrival date & time: 12/24/20  1958     History Chief Complaint  Patient presents with   Loss of Consciousness    CLEON THOMA is a 80 y.o. female hx of DM, HTN, HL, here presenting with syncope.  Patient was outside and was not eating and drinking much but drank half a pint of alcohol. She states that she felt lightheaded and dizzy and apparently fell back from a seated position.  No head injury.  Denies any chest pain or shortness of breath .   The history is provided by the patient.      Past Medical History:  Diagnosis Date   Diabetes mellitus without complication (H[facility])    Gout    High cholesterol    Hypertension     Patient Active Problem List   Diagnosis Date Noted   Pure hypercholesterolemia 06/17/2018   Type II diabetes mellitus, uncontrolled ([facility])    Benign hypertension with chronic kidney disease, stage II 03/01/2018    Past Surgical History:  Procedure Laterality Date   TUBAL LIGATION  1976     OB History   No obstetric history on file.     Family History  Problem Relation Age of Onset   Diabetes Mother    Hypertension Mother    Cancer Father    Hypertension Father     Social History   Tobacco Use   Smoking status: Former    Packs/day: 0.75    Years: 15.00    Pack years: 11.25    Types: Cigarettes    Start date: 1961    Quit date: 1976    Years since quitting: 46.6   Smokeless tobacco: Never   Tobacco comments:    smoked at least 10 years, can't remember exact years  Vaping Use   Vaping Use: Never used  Substance Use Topics   Alcohol use: Yes    Comment: on occassion wine   Drug use: No    Home Medications Prior to Admission medications   Medication Sig Start Date End Date Taking? Authorizing Provider  allopurinol (ZYLOPRIM) 100 MG tablet TAKE 1 TABLET BY MOUTH EVERY DAY 05/12/20   [name], MD  ferrous sulfate 325 (65 FE) MG tablet Take  325 mg by mouth See admin instructions. Take one tablet (325 mg) by mouth daily Monday thru Friday (skip Saturday and Sunday)    [provider]  Misc Natural Products (BLOOD SUGAR BALANCE PO) Take 1 capsule by mouth daily.     [provider]  Multiple Vitamin (MULTIVITAMIN WITH MINERALS) TABS tablet Take 1 tablet by mouth daily with breakfast.    [provider]  NON FORMULARY daily as needed. CBD capsules Pure organic hemp extract    [provider]  pravastatin (PRAVACHOL) 40 MG tablet One tab po M-F and skip Saturday/Sundays 12/13/20   [name], MD  valsartan (DIOVAN) 160 MG tablet Take 1 tablet (160 mg total) by mouth daily. 12/13/20 12/13/21  [name], MD    Allergies    Patient has no known allergies.  Review of Systems   Review of Systems  Neurological:  Positive for syncope.  All other systems reviewed and are negative.  Physical Exam Updated Vital Signs BP (!) 185/78   Pulse 86   Resp 14   Ht 5\' 2"  (1.575 m)   Wt 83 kg   SpO2 99%   BMI 33.47 kg/m   Physical  Exam Vitals and nursing note reviewed.  Constitutional:      Comments: Slightly dehydrated  HENT:     Head: Normocephalic.     Nose: Nose normal.     Mouth/Throat:     Mouth: Mucous membranes are dry.  Eyes:     Extraocular Movements: Extraocular movements intact.     Pupils: Pupils are equal, round, and reactive to light.  Cardiovascular:     Rate and Rhythm: Normal rate and regular rhythm.     Pulses: Normal pulses.     Heart sounds: Normal heart sounds.  Pulmonary:     Effort: Pulmonary effort is normal.     Breath sounds: Normal breath sounds.  Abdominal:     General: Abdomen is flat.     Palpations: Abdomen is soft.  Musculoskeletal:        General: Normal range of motion.     Cervical back: Normal range of motion and neck supple.  Skin:    General: Skin is warm.     Capillary Refill: Capillary refill takes less than 2 seconds.  Neurological:      General: No focal deficit present.     Mental Status: She is oriented to person, place, and time.  Psychiatric:        Mood and Affect: Mood normal.        Behavior: Behavior normal.    ED Results / Procedures / Treatments   Labs (all labs ordered are listed, but only abnormal results are displayed) Labs Reviewed  URINALYSIS, ROUTINE W REFLEX MICROSCOPIC - Abnormal; Notable for the following components:      Result Value   Hgb urine dipstick SMALL (*)    Bacteria, UA RARE (*)    All other components within normal limits  COMPREHENSIVE METABOLIC PANEL - Abnormal; Notable for the following components:   Glucose, Bld 116 (*)    BUN 25 (*)    Calcium 8.8 (*)    All other components within normal limits  CK - Abnormal; Notable for the following components:   Total CK 333 (*)    All other components within normal limits  CBG MONITORING, ED - Abnormal; Notable for the following components:   Glucose-Capillary 117 (*)    All other components within normal limits  CBC WITH DIFFERENTIAL/PLATELET  ETHANOL  TROPONIN I (HIGH SENSITIVITY)  TROPONIN I (HIGH SENSITIVITY)    EKG EKG Interpretation  Date/Time:  Saturday December 24 2020 20:23:25 EDT Ventricular Rate:  78 PR Interval:  168 QRS Duration: 91 QT Interval:  381 QTC Calculation: 434 R Axis:   55 Text Interpretation: Sinus rhythm No significant change since last tracing Confirmed by Richardean Canal [id number]) on 12/24/2020 8:41:06 PM  Radiology No results found.  Procedures Procedures   Medications Ordered in ED Medications  sodium chloride 0.9 % bolus 1,000 mL (1,000 mLs Intravenous New Bag/Given (Non-Interop) 12/24/20 2030)    ED Course  I have reviewed the triage vital signs and the nursing notes.  Pertinent labs & imaging results that were available during my care of the patient were reviewed by me and considered in my medical decision making (see chart for details).    MDM Rules/Calculators/A&P                           LAURALEI CLOUSE is a 80 y.o. female here with near syncope. She drank alcohol and didn't eat and drink much. Consider alcohol intoxication vs dehydration  vs heat exhaustion. Non focal neuro exam now. Will get labs,  ETOH, hydrate patient.  10:34 PM Labs unremarkable. ETOH negative. CK only 333. Felt better after IVF. Stable for discharge.   11:13 PM After discharge, family called.  She apparently has been slowly declining.  She is being evaluated for dementia.  Her MRI about a month ago was unremarkable.  She has no new neurodeficits right now.  She has been ambulating to the bathroom.  I think this is a slowly decline.  Her alcohol level was negative even though she thought she drank half a pint of vodka.  Now she is telling family that she may have taken some cocaine.  UDS is pending.  However her troponin is negative her EKG is unremarkable.  I told him that I will contact case management to contact on Monday and set up home health.  Stable for discharge.    Final Clinical Impression(s) / ED Diagnoses Final diagnoses:  None    Rx / DC Orders ED Discharge Orders     None        Charlynne Pander, MD 12/24/20 2238    Charlynne Pander, MD 12/24/20 2314

## 2020-12-26 ENCOUNTER — Telehealth: Payer: Self-pay

## 2020-12-26 ENCOUNTER — Encounter: Payer: Self-pay | Admitting: Internal Medicine

## 2020-12-26 NOTE — Telephone Encounter (Signed)
Called pt to sch a ED f/u, vm left for her to give the office a call back.

## 2020-12-27 ENCOUNTER — Other Ambulatory Visit: Payer: Self-pay

## 2020-12-27 ENCOUNTER — Ambulatory Visit (INDEPENDENT_AMBULATORY_CARE_PROVIDER_SITE_OTHER): Payer: BC Managed Care – PPO | Admitting: Nurse Practitioner

## 2020-12-27 ENCOUNTER — Ambulatory Visit: Payer: Medicare Other

## 2020-12-27 ENCOUNTER — Encounter: Payer: Self-pay | Admitting: Nurse Practitioner

## 2020-12-27 ENCOUNTER — Telehealth: Payer: Self-pay

## 2020-12-27 VITALS — BP 146/88 | Temp 98.6°F | Ht 62.0 in | Wt 184.8 lb

## 2020-12-27 DIAGNOSIS — I129 Hypertensive chronic kidney disease with stage 1 through stage 4 chronic kidney disease, or unspecified chronic kidney disease: Secondary | ICD-10-CM

## 2020-12-27 DIAGNOSIS — N182 Chronic kidney disease, stage 2 (mild): Secondary | ICD-10-CM | POA: Diagnosis not present

## 2020-12-27 DIAGNOSIS — E7800 Pure hypercholesterolemia, unspecified: Secondary | ICD-10-CM

## 2020-12-27 DIAGNOSIS — E538 Deficiency of other specified B group vitamins: Secondary | ICD-10-CM

## 2020-12-27 DIAGNOSIS — R7303 Prediabetes: Secondary | ICD-10-CM

## 2020-12-27 MED ORDER — PRAVASTATIN SODIUM 40 MG PO TABS: ORAL_TABLET | ORAL | 1 refills | Status: DC

## 2020-12-27 MED ORDER — VALSARTAN 160 MG PO TABS: 160.0000 mg | ORAL_TABLET | Freq: Every day | ORAL | 1 refills | Status: DC

## 2020-12-27 MED ORDER — CYANOCOBALAMIN 1000 MCG/ML IJ SOLN
1000.0000 ug | Freq: Once | INTRAMUSCULAR | Status: AC
Start: 2020-12-27 — End: 2020-12-27
  Administered 2020-12-27: 1000 ug via INTRAMUSCULAR

## 2020-12-27 NOTE — Patient Instructions (Signed)

## 2020-12-27 NOTE — Telephone Encounter (Signed)
The pt was notified that her appt is at 4 this afternoon and not at 11 this morning.

## 2020-12-27 NOTE — Progress Notes (Signed)
I,Pamela Key,acting as a Neurosurgeon for [employer], NP.,have documented all relevant documentation on the behalf of [employer], NP,as directed by  Pamela Ivory, NP while in the presence of Pamela Ivory, NP.  This visit occurred during the SARS-CoV-2 public health emergency.  Safety protocols were in place, including screening questions prior to the visit, additional usage of staff PPE, and extensive cleaning of exam room while observing appropriate contact time as indicated for disinfecting solutions.  Subjective:     Patient ID: Pamela Key , Pamela Key    DOB: Jan 18, 1941 , 80 y.o.   MRN: [id number]   Chief Complaint  Patient presents with   B12 Injection     HPI  The patient is here today for hospital and vitamin b12 f/u. She smoked some marajuana with a family member. She wasn't alert after smoking. They took some blood work and discharged her after ED visit. She was admitted because she wasn't feeling her self after smoking the mariajuana.    She drinks wine and smokes mariajuana occasionally.  She has not been taking her hyperlipidemia and HTN medication. She wants refill today.  Her BP is elevated at 146/88 today because she has not been taking her meds. Otherwise she has no concern.   Hypertension This is a chronic problem. The current episode started more than 1 year ago. The problem has been gradually improving since onset. The problem is controlled. Pertinent negatives include no chest pain, headaches, palpitations or shortness of breath. Risk factors for coronary artery disease include diabetes mellitus, dyslipidemia, post-menopausal state, obesity and sedentary lifestyle. Past treatments include ACE inhibitors. The current treatment provides moderate improvement. Compliance problems include exercise.  Hypertensive end-organ damage includes kidney disease.    Past Medical History:  Diagnosis Date   Diabetes mellitus without complication (HCC)    Gout     High cholesterol    Hypertension      Family History  Problem Relation Age of Onset   Diabetes Mother    Hypertension Mother    Cancer Father    Hypertension Father      Current Outpatient Medications:    ferrous sulfate 325 (65 FE) MG tablet, Take 325 mg by mouth See admin instructions. Take one tablet (325 mg) by mouth daily Monday thru Friday (skip Saturday and Sunday), Disp: , Rfl:    Misc Natural Products (BLOOD SUGAR BALANCE PO), Take 1 capsule by mouth daily. , Disp: , Rfl:    Multiple Vitamin (MULTIVITAMIN WITH MINERALS) TABS tablet, Take 1 tablet by mouth daily with breakfast., Disp: , Rfl:    NON FORMULARY, daily as needed. CBD capsules Pure organic hemp extract, Disp: , Rfl:    allopurinol (ZYLOPRIM) 100 MG tablet, TAKE 1 TABLET BY MOUTH EVERY DAY (Patient not taking: Reported on 12/27/2020), Disp: 90 tablet, Rfl: 1   pravastatin (PRAVACHOL) 40 MG tablet, One tab po M-F and skip Saturday/Sundays, Disp: 75 tablet, Rfl: 1   valsartan (DIOVAN) 160 MG tablet, Take 1 tablet (160 mg total) by mouth daily., Disp: 90 tablet, Rfl: 1   No Known Allergies   Review of Systems  Constitutional: Negative.  Negative for chills and fever.  HENT:  Negative for congestion, sinus pain and sneezing.   Respiratory: Negative.  Negative for cough, shortness of breath and wheezing.   Cardiovascular: Negative.  Negative for chest pain and palpitations.  Gastrointestinal: Negative.  Negative for constipation, diarrhea, nausea and vomiting.  Musculoskeletal:  Negative for arthralgias and myalgias.  Neurological:  Negative  for headaches.  Psychiatric/Behavioral: Negative.    All other systems reviewed and are negative.   Today's Vitals   12/27/20 1522  BP: (!) 146/88  Temp: 98.6 F (37 C)  TempSrc: Oral  Weight: 184 lb 12.8 oz (83.8 kg)  Height: 5\' 2"  (1.575 m)  PainSc: 0-No pain   Body mass index is 33.8 kg/m.  Wt Readings from Last 3 Encounters:  12/27/20 184 lb 12.8 oz (83.8 kg)   12/24/20 182 lb 15.7 oz (83 kg)  12/13/20 182 lb 9.6 oz (82.8 kg)    BP Readings from Last 3 Encounters:  12/27/20 (!) 146/88  12/24/20 (!) 173/79  12/13/20 136/72    Objective:  Physical Exam Constitutional:      Appearance: Normal appearance. She is obese.  HENT:     Head: Normocephalic and atraumatic.  Cardiovascular:     Rate and Rhythm: Normal rate and regular rhythm.     Pulses: Normal pulses.     Heart sounds: Normal heart sounds. No murmur heard. Pulmonary:     Effort: Pulmonary effort is normal. No respiratory distress.     Breath sounds: Normal breath sounds. No wheezing.  Skin:    General: Skin is warm and dry.     Capillary Refill: Capillary refill takes less than 2 seconds.  Neurological:     Mental Status: She is alert.        Assessment And Plan:     1. B12 deficiency - Vitamin B12 - cyanocobalamin ((VITAMIN B-12)) injection 1,000 mcg  2. Prediabetes -Reviewed meds with patient.  -Her last Hgb A1c on 8/2 was 6.2  --Discussed with patient the importance of glycemic control and long term complications from uncontrolled diabetes. Discussed with the patient the importance of compliance with home glucose monitoring, diet which includes decrease amount of sugary drinks and foods. Importance of exercise was also discussed with the patient. Importance of eye exams, self foot care and compliance to office visits was also discussed with the patient.   3. Pure hypercholesterolemia -Chronic, reviewed meds with patient  -Refilled pravastatin 40 mg once daily  - Lipid panel --Educated patient about a diet that is low in fat and high fatty foods including dairy products. Increase in take of fish and fiber. Decrease intake of red meats and fast foods. Exercise for atleast 4-5 times a week or atleast 30-45 min. Drink a lot of water.    4. Benign hypertension with chronic kidney disease, stage II -Chronic, patient will start taking meds and keep her BP at home   -Refilled Valsartan  -Limit the intake of processed foods and salt intake. You should increase your intake of green vegetables and fruits. Limit the use of alcohol. Limit fast foods and fried foods. Avoid high fatty saturated and trans fat foods. Keep yourself hydrated with drinking water. Avoid red meats. Eat lean meats instead. Exercise for atleast 30-45 min for atleast 4-5 times a week.  -Patient will keep log of her BP at home and send mychart msg   Side effects and appropriate use of all the medication(s) were discussed with the patient today. Patient advised to use the medication(s) as directed by their healthcare provider. The patient was encouraged to read, review, and understand all associated package inserts and contact our office with any questions or concerns. The patient accepts the risks of the treatment plan and had an opportunity to ask questions.   Follow up: 3 months   The patient was encouraged to call or send  a message through MyChart for any questions or concerns.   Staying healthy and adopting a healthy lifestyle for your overall health is important. You should eat 7 or more servings of fruits and vegetables per day. You should drink plenty of water to keep yourself hydrated and your kidneys healthy. This includes about 65-80+ fluid ounces of water. Limit your intake of animal fats especially for elevated cholesterol. Avoid highly processed food and limit your salt intake if you have hypertension. Avoid foods high in saturated/Trans fats. Along with a healthy diet it is also very important to maintain time for yourself to maintain a healthy mental health with low stress levels. You should get atleast 150 min of moderate intensity exercise weekly for a healthy heart. Along with eating right and exercising, aim for at least 7-9 hours of sleep daily.  Eat more whole grains which includes barley, wheat berries, oats, brown rice and whole wheat pasta. Use healthy plant oils which include  olive, soy, corn, sunflower and peanut. Limit your caffeine and sugary drinks. Limit your intake of fast foods. Limit milk and dairy products to one or two daily servings.     Patient was given opportunity to ask questions. Patient verbalized understanding of the plan and was able to repeat key elements of the plan. All questions were answered to their satisfaction.  Pamela Dona Klemann, Pamela Key   I, Pamela  Key have reviewed all documentation for this visit. The documentation on 12/27/20 for the exam, diagnosis, procedures, and orders are all accurate and complete.    IF YOU HAVE BEEN REFERRED TO A SPECIALIST, IT MAY TAKE 1-2 WEEKS TO SCHEDULE/PROCESS THE REFERRAL. IF YOU HAVE NOT HEARD FROM US/SPECIALIST IN TWO WEEKS, PLEASE GIVE [place] A CALL AT [phone].   THE PATIENT IS ENCOURAGED TO PRACTICE SOCIAL DISTANCING DUE TO THE COVID-19 PANDEMIC.

## 2020-12-28 LAB — LIPID PANEL
Chol/HDL Ratio: 3.6 ratio (ref 0.0–4.4)
Cholesterol, Total: 198 mg/dL (ref 100–199)
HDL: 55 mg/dL (ref >39)
LDL Chol Calc (NIH): 123 mg/dL — ABNORMAL HIGH (ref 0–99)
Triglycerides: 110 mg/dL (ref 0–149)
VLDL Cholesterol Cal: 20 mg/dL (ref 5–40)

## 2020-12-28 LAB — VITAMIN B12: Vitamin B-12: 734 pg/mL (ref 232–1245)

## 2021-01-06 ENCOUNTER — Encounter: Payer: Self-pay | Admitting: Internal Medicine

## 2021-01-10 ENCOUNTER — Ambulatory Visit: Payer: Medicare Other

## 2021-01-11 ENCOUNTER — Telehealth: Payer: Medicare Other | Admitting: Nurse Practitioner

## 2021-01-11 ENCOUNTER — Ambulatory Visit: Payer: Medicare HMO | Admitting: Internal Medicine

## 2021-01-12 DIAGNOSIS — M21611 Bunion of right foot: Secondary | ICD-10-CM

## 2021-01-12 HISTORY — DX: Bunion of right foot: M21.611

## 2021-01-19 ENCOUNTER — Telehealth: Payer: Medicare Other

## 2021-01-24 ENCOUNTER — Ambulatory Visit (INDEPENDENT_AMBULATORY_CARE_PROVIDER_SITE_OTHER): Payer: BC Managed Care – PPO

## 2021-01-24 ENCOUNTER — Other Ambulatory Visit: Payer: Self-pay

## 2021-01-24 VITALS — BP 136/84 | HR 81 | Temp 97.8°F | Ht 62.0 in | Wt 190.2 lb

## 2021-01-24 DIAGNOSIS — R413 Other amnesia: Secondary | ICD-10-CM

## 2021-01-24 MED ORDER — CYANOCOBALAMIN 1000 MCG/ML IJ SOLN
1000.0000 ug | Freq: Once | INTRAMUSCULAR | Status: AC
  Administered 2021-01-24: 1000 ug via INTRAMUSCULAR

## 2021-01-24 NOTE — Progress Notes (Signed)
Patient presents today for a vitamin B1 injection. YL,RMA

## 2021-01-27 ENCOUNTER — Ambulatory Visit: Payer: Medicare Other | Admitting: Physician Assistant

## 2021-01-29 ENCOUNTER — Other Ambulatory Visit: Payer: Self-pay | Admitting: Internal Medicine

## 2021-01-30 ENCOUNTER — Other Ambulatory Visit: Payer: Self-pay

## 2021-01-30 MED ORDER — GLUCOSE BLOOD VI STRP: ORAL_STRIP | 2 refills | Status: DC

## 2021-02-06 ENCOUNTER — Other Ambulatory Visit: Payer: Self-pay

## 2021-02-06 ENCOUNTER — Telehealth: Payer: Self-pay

## 2021-02-06 ENCOUNTER — Telehealth: Payer: Medicare Other

## 2021-02-06 DIAGNOSIS — N182 Chronic kidney disease, stage 2 (mild): Secondary | ICD-10-CM

## 2021-02-06 DIAGNOSIS — E1122 Type 2 diabetes mellitus with diabetic chronic kidney disease: Secondary | ICD-10-CM

## 2021-02-06 MED ORDER — ACCU-CHEK GUIDE W/DEVICE KIT: PACK | 1 refills | Status: AC

## 2021-02-06 NOTE — Telephone Encounter (Signed)
April nurse with [facility] health called stating patient needs a accu check meter to go along with her test strips she has. She is also requesting a couple more visits with patient. One this week and one next week. [phone].   I returned her call and advised her that I have sent in the prescription and also the verbal order is ok with Dr.Sanders [facility]

## 2021-02-06 NOTE — Telephone Encounter (Signed)
  Care Management   Follow Up Note   02/06/2021 Name: Pamela Key MRN: [id number] DOB: 01-28-1941   Referred by: [name], MD Reason for referral : Chronic Care Management (Unsuccessful call)   An unsuccessful telephone outreach was attempted today. The patient was referred to the case management team for assistance with care management and care coordination. SW left a HIPAA compliant voice message for the patients daughter requesting a return call.  Follow Up Plan: The care management team will reach out to the patient again over the next 14 days.   [name], BSW, CDP Social Worker, Certified Dementia Practitioner [facility] Care Management [phone]    '

## 2021-02-07 ENCOUNTER — Ambulatory Visit (INDEPENDENT_AMBULATORY_CARE_PROVIDER_SITE_OTHER): Payer: BC Managed Care – PPO

## 2021-02-07 ENCOUNTER — Other Ambulatory Visit: Payer: Self-pay

## 2021-02-07 VITALS — BP 120/84 | HR 65 | Temp 98.2°F | Ht 62.0 in

## 2021-02-07 DIAGNOSIS — E538 Deficiency of other specified B group vitamins: Secondary | ICD-10-CM

## 2021-02-07 MED ORDER — CYANOCOBALAMIN 1000 MCG/ML IJ SOLN
1000.0000 ug | Freq: Once | INTRAMUSCULAR | Status: AC
  Administered 2021-02-07: 1000 ug via INTRAMUSCULAR

## 2021-02-13 ENCOUNTER — Ambulatory Visit (INDEPENDENT_AMBULATORY_CARE_PROVIDER_SITE_OTHER): Payer: Medicare Other

## 2021-02-13 ENCOUNTER — Telehealth: Payer: Medicare Other

## 2021-02-13 DIAGNOSIS — E538 Deficiency of other specified B group vitamins: Secondary | ICD-10-CM

## 2021-02-13 DIAGNOSIS — E1122 Type 2 diabetes mellitus with diabetic chronic kidney disease: Secondary | ICD-10-CM

## 2021-02-13 DIAGNOSIS — I129 Hypertensive chronic kidney disease with stage 1 through stage 4 chronic kidney disease, or unspecified chronic kidney disease: Secondary | ICD-10-CM

## 2021-02-13 DIAGNOSIS — N182 Chronic kidney disease, stage 2 (mild): Secondary | ICD-10-CM

## 2021-02-13 DIAGNOSIS — R413 Other amnesia: Secondary | ICD-10-CM

## 2021-02-14 ENCOUNTER — Other Ambulatory Visit: Payer: Self-pay

## 2021-02-14 MED ORDER — GLUCOSE BLOOD VI STRP: ORAL_STRIP | 2 refills | Status: AC

## 2021-02-16 ENCOUNTER — Other Ambulatory Visit: Payer: Self-pay

## 2021-02-16 ENCOUNTER — Ambulatory Visit: Payer: Medicare Other

## 2021-02-16 DIAGNOSIS — I129 Hypertensive chronic kidney disease with stage 1 through stage 4 chronic kidney disease, or unspecified chronic kidney disease: Secondary | ICD-10-CM

## 2021-02-16 DIAGNOSIS — E1122 Type 2 diabetes mellitus with diabetic chronic kidney disease: Secondary | ICD-10-CM

## 2021-02-16 DIAGNOSIS — N182 Chronic kidney disease, stage 2 (mild): Secondary | ICD-10-CM

## 2021-02-16 DIAGNOSIS — R413 Other amnesia: Secondary | ICD-10-CM

## 2021-02-16 MED ORDER — ACCU-CHEK GUIDE VI STRP: ORAL_STRIP | 2 refills | Status: AC

## 2021-02-16 NOTE — Patient Instructions (Signed)
Social Worker Visit Information  Goals we discussed today:   Goals Addressed             This Visit's Progress    Home and Family Safety Maintained       Timeframe:  Long-Range Goal Priority:  High Start Date:  5.19.22                           Expected End Date: 8.17.22                       Next planned outreach: 10.20.22  Patient Goals/Self-Care Activities patient will: with the help of her family - Attend Neuropsychology appointment -Contact SW as needed prior to next scheduled call         Patient verbalizes understanding of instructions provided today and agrees to view in [place].   Follow Up Plan: SW will follow up with patient by phone over the next 21 days   [name], BSW, CDP Social Worker, Certified Dementia Practitioner [facility] Care Management [phone]

## 2021-02-16 NOTE — Chronic Care Management (AMB) (Signed)
Chronic Care Management    Social Work Note  02/16/2021 Name: Pamela Key MRN: [id number] DOB: 1940/08/02  Pamela Key is a 80 y.o. year old female who is a primary care patient of Glendale Chard, MD. The CCM team was consulted to assist the patient with chronic disease management and/or care coordination needs related to:  DM II, Hypertensive Nephropathy, Memory Deficit .   Engaged with patients son by phone  for follow up visit in response to provider referral for social work chronic care management and care coordination services.   Consent to Services:  The patient was given information about Chronic Care Management services, agreed to services, and gave verbal consent prior to initiation of services.  Please see initial visit note for detailed documentation.   Patient agreed to services and consent obtained.   Assessment: Review of patient past medical history, allergies, medications, and health status, including review of relevant consultants reports was performed today as part of a comprehensive evaluation and provision of chronic care management and care coordination services.     SDOH (Social Determinants of Health) assessments and interventions performed:    Advanced Directives Status: Not addressed in this encounter.  CCM Care Plan  No Known Allergies  Outpatient Encounter Medications as of 02/16/2021  Medication Sig   allopurinol (ZYLOPRIM) 100 MG tablet TAKE 1 TABLET BY MOUTH EVERY DAY (Patient not taking: Reported on 12/27/2020)   Blood Glucose Monitoring Suppl (ACCU-CHEK GUIDE) w/Device KIT Use to check blood sugars twice daily E11.65   ferrous sulfate 325 (65 FE) MG tablet Take 325 mg by mouth See admin instructions. Take one tablet (325 mg) by mouth daily Monday thru Friday (skip Saturday and Sunday)   glucose blood test strip Use as instructed to check blood sugars twice daily   Misc Natural Products (BLOOD SUGAR BALANCE PO) Take 1 capsule by mouth daily.     Multiple Vitamin (MULTIVITAMIN WITH MINERALS) TABS tablet Take 1 tablet by mouth daily with breakfast.   NON FORMULARY daily as needed. CBD capsules Pure organic hemp extract   pravastatin (PRAVACHOL) 40 MG tablet One tab po M-F and skip Saturday/Sundays   valsartan (DIOVAN) 160 MG tablet Take 1 tablet (160 mg total) by mouth daily.   No facility-administered encounter medications on file as of 02/16/2021.    Patient Active Problem List   Diagnosis Date Noted   Pure hypercholesterolemia 06/17/2018   Type II diabetes mellitus, uncontrolled    Benign hypertension with chronic kidney disease, stage II 03/01/2018    Conditions to be addressed/monitored:  DM II, Hypertensive Nephropathy, Memory Deficit ; Limited access to caregiver  Care Plan : Social Work [facility] Care Plan  Updates made by [name] since 02/16/2021 12:00 AM     Problem: Safety - family concerned pt has dementia      Long-Range Goal: Home and Family Safety Maintained   Start Date: 09/29/2020  Expected End Date: 12/28/2020  Recent Progress: On track  Priority: High  Note:   Current Barriers:  Chronic disease management support and education needs related to HTN and DM  Limited support system Cognitive Decline  Social Worker Clinical Goal(s):  Over the next 90 days the patient and her daughter will work with SW to identify resources to assist with patient care needs Over the next 10 days the patient and her children will follow up on status of Medicaid application as directed by SW -Goal Met Over the next 30 days the patient will schedule an appointment  with neurology -Goal Met SW Interventions:  Inter-disciplinary care team collaboration (see longitudinal plan of care) Collaboration with Sanders, Robyn, MD regarding development and update of comprehensive plan of care as evidenced by provider attestation and co-signature Successful outbound call placed to the patients son [name] Washington to follow up on care  coordination needs Discussed the patients home has been fumigated and repaired - patient has moved back into her home with her nephew checking on her each day Determined the copay amount the patient would be responsible for to enroll in PACE is over budget Mr. Washington reports there is a chance if the patient is diagnosed with dementia she may qualify for PACE as the income threshold is higher with dementia patients Performed chart review to note upcoming neuropsychology appointment scheduled later this month The patients family is aware of this appointment and will assist with patient transportation needs Discussed pending the outcome of that appointment they may revisit PACE enrollment Mr. Washington reports the patient remains active with home health and receives visits twice weekly to assist with medication management - patient has had difficulty following instructions of nurse and often forgets instructions in between visits Discussed the family would like a referral for podiatry to address a bunion on right foot - Mr. Washington reports the patient is having to wear a size larger to accommodate size of bunion Discussed the family would also like a referral for counseling for the patient to participate in once weekly to assist with coping of memory loss- Mr. Washington advised SW he feels the patient is depressed and drinking more due to loss of memory Advised Mr. Washington SW would collaborate with the patients provider regarding desired referrals Discussed the patient may need to be seen in the office prior to referrals being placed Scheduled follow up call over the next 21 days Collaboration with Dr. Sanders and Angel Little RN Care Manager to provide a patient update; requested feedback from Dr. Sanders regarding requested referrals  Patient Goals/Self-Care Activities patient will: with the help of her family -Attend Neuropsychology appointment -Contact SW as needed prior to next  scheduled call  Follow Up Plan:  SW will follow up with the patients daughter over the next 21 days       Follow Up Plan: SW will follow up with patient by phone over the next 21 days       , BSW, CDP Social Worker, Certified Dementia Practitioner [facility] Care Management [phone]         

## 2021-02-27 ENCOUNTER — Encounter: Payer: Self-pay | Admitting: Counselor

## 2021-02-27 ENCOUNTER — Other Ambulatory Visit: Payer: Self-pay

## 2021-02-27 ENCOUNTER — Ambulatory Visit: Payer: BC Managed Care – PPO

## 2021-02-27 ENCOUNTER — Ambulatory Visit (INDEPENDENT_AMBULATORY_CARE_PROVIDER_SITE_OTHER): Payer: BC Managed Care – PPO | Admitting: Counselor

## 2021-02-27 DIAGNOSIS — R419 Unspecified symptoms and signs involving cognitive functions and awareness: Secondary | ICD-10-CM

## 2021-02-27 DIAGNOSIS — R413 Other amnesia: Secondary | ICD-10-CM

## 2021-02-27 DIAGNOSIS — F09 Unspecified mental disorder due to known physiological condition: Secondary | ICD-10-CM

## 2021-02-27 DIAGNOSIS — F0280 Dementia in other diseases classified elsewhere without behavioral disturbance: Secondary | ICD-10-CM | POA: Insufficient documentation

## 2021-02-27 HISTORY — DX: Dementia in other diseases classified elsewhere, unspecified severity, without behavioral disturbance, psychotic disturbance, mood disturbance, and anxiety: F02.80

## 2021-02-27 NOTE — Progress Notes (Signed)
Psychometrist Note   Cognitive testing was administered to Pamela Key by Lamar Benes, B.S. (Technician) under the supervision of Alphonzo Severance, [name], ABN. Ms. Coggin was able to tolerate all test procedures. Dr. Nicole Kindred met with the patient as needed to manage any emotional reactions to the testing procedures. Rest breaks were offered.    The battery of tests administered was selected by Dr. Nicole Kindred with consideration to the patient's current level of functioning, the nature of her symptoms, emotional and behavioral responses during the interview, level of literacy, observed level of motivation/effort, and the nature of the referral question. This battery was communicated to the psychometrist. Communication between Dr. Nicole Kindred and the psychometrist was ongoing throughout the evaluation and Dr. Nicole Kindred was immediately accessible at all times. Dr. Nicole Kindred provided supervision to the technician on the date of this service, to the extent necessary to assure the quality of all services provided.    Ms. Dejarnett will return in approximately one week for an interactive feedback session with Dr. Nicole Kindred, at which time test performance, clinical impressions, and treatment recommendations will be reviewed in detail. The patient understands she can contact our office should she require our assistance before this time.   A total of 110 minutes of billable time were spent with Pamela Key by the technician, including test administration and scoring time. Billing for these services is reflected in Dr. Les Pou note.   This note reflects time spent with the psychometrician and does not include test scores, clinical history, or any interpretations made by Dr. Nicole Kindred. The full report will follow in a separate note.
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
## 2021-02-27 NOTE — Progress Notes (Signed)
NEUROPSYCHOLOGICAL TEST SCORES [facility]  Patient Name: Pamela Key MRN: [id number] Date of Birth: 07-02-1940 Age: 80 y.o. Education: 18 years  Measurement properties of test scores: IQ, Index, and Standard Scores (SS): Mean = 100; Standard Deviation = 15 Scaled Scores (Ss): Mean = 10; Standard Deviation = 3 Z scores (Z): Mean = 0; Standard Deviation = 1 T scores (T); Mean = 50; Standard Deviation = 10  TEST SCORES:    Note: This summary of test scores accompanies the interpretive report and should not be interpreted by unqualified individuals or in isolation without reference to the report. Test scores are relative to age, gender, and educational history as available and appropriate.   Performance Validity        "A" Random Letter Test Raw  Descriptor      Errors 0 Within Expectation  The Dot Counting Test: 11 Within Expectation  NAB Effort Index 4 Below Expectation      Mental Status Screening     Total Score Descriptor  MoCA 19 Mild Dementia      Expected Functioning        Wide Range Achievement Test: Standard/Scaled Score Percentile      Word Reading 100 50      Reynolds Intellectual Screening Test Standard/T-score Percentile      Guess What 37 9      Odd Item Out 72 99  RIST Index 108 70      Attention/Processing Speed        Neuropsychological Assessment Battery (Attention Module, Form 1): T-score Percentile      Digits Forward 38 12      Digits Backwards 40 16      Repeatable Battery for the Assessment of Neuropsychological Status (Form A): Scaled Score Percentile      Coding 8 25      Language        Neuropsychological Assessment Battery (Language Module, Form 1): T-score Percentile      Naming   (25) 29 2      Verbal Fluency: T-score Percentile      Controlled Oral Word Association (F-A-S) 44 27      Semantic Fluency (Animals) 50 50      Memory:        Neuropsychological Assessment Battery (Memory Module, Form 1): T-score  Percentile      List Learning           List A Immediate Recall   (1, 5, 4) 19 <1         List B Immediate Recall   (1) 26 1         List A Short Delayed Recall   (0) 19 <1         List A Long Delayed Recall   (0) 22 <1         List A Percent Retention   (0 %) --- <1         List A Long Delayed Yes/No Recognition Hits   (4) --- <1         List A Long Delayed Yes/No Recognition False Alarms   (5) --- 50         List A Recognition Discriminability Index --- 2      Story Learning           Immediate Recall   (7, 10) 19 <1         Delayed Recall   (0) 24 <1  Percent Retention   (0 %) --- <1      Daily Living Memory            Immediate Recall   (16, 9) 25 1          Delayed Recall   (0, 0) 19 <1          Percent Retention (0 %) --- <1          Recognition Hits    (4) --- <1      Repeatable Battery for the Assessment of Neuropsychological Status (Form A): Scaled Score Percentile         Figure Recall   (0) 1 <1      Visuospatial/Constructional Functioning        Repeatable Battery for the Assessment of Neuropsychological Status (Form A): Standard/Scaled Score Percentile     Visuospatial/Constructional Index 102 55         Figure Copy   (19) 12 75         Judgment of Line Orientation   (16) --- 26-50      Executive Functioning        Modified [employer] Test (MWCST): Standard/T-Score Percentile      Number of Categories Correct 57 75      Number of Perseverative Errors 63 91      Number of Total Errors 53 62      Percent Perseverative Errors 62 88  Executive Function Composite 117 87      Trail Making Test: T-Score Percentile      Part A 43 25      Part B 43 25      Boston Diagnostic Aphasia Exam: Raw Score Scaled Score      Complex Ideational Material 10 7      Clock Drawing Raw Score Descriptor      Command 9 WNL      Rating Scales            Clinical Dementia Rating Raw Score Descriptor      Sum of Boxes 4.5 Mild Dementia      Global Score 0.5 MCI       Quick Dementia Rating System Raw Score Descriptor      Sum of Boxes 3.5 Very Mild Dementia      Total Score 7 Mild Dementia  Geriatric Depression Scale - Short Form 1 Negative   Sherrelle Prochazka V. Roseanne Reno PsyD, ABN Clinical Neuropsychologist

## 2021-03-01 NOTE — Chronic Care Management (AMB) (Signed)
Chronic Care Management   CCM RN Visit Note  02/13/2021 Name: Pamela Key MRN: [id number] DOB: [birth year]  Subjective: Pamela Key is a 80 y.o. year old female who is a primary care patient of Glendale Chard, MD. The care management team was consulted for assistance with disease management and care coordination needs.    Collaboration with [name] BSW  for  Case Collaboration   in response to provider referral for case management and/or care coordination services.   Consent to Services:  The patient was given information about Chronic Care Management services, agreed to services, and gave verbal consent prior to initiation of services.  Please see initial visit note for detailed documentation.   Patient agreed to services and verbal consent obtained.   Assessment: Review of patient past medical history, allergies, medications, health status, including review of consultants reports, laboratory and other test data, was performed as part of comprehensive evaluation and provision of chronic care management services.   SDOH (Social Determinants of Health) assessments and interventions performed:    CCM Care Plan  No Known Allergies  Outpatient Encounter Medications as of 02/13/2021  Medication Sig   allopurinol (ZYLOPRIM) 100 MG tablet TAKE 1 TABLET BY MOUTH EVERY DAY (Patient not taking: Reported on 12/27/2020)   Blood Glucose Monitoring Suppl (ACCU-CHEK GUIDE) w/Device KIT Use to check blood sugars twice daily E11.65   ferrous sulfate 325 (65 FE) MG tablet Take 325 mg by mouth See admin instructions. Take one tablet (325 mg) by mouth daily Monday thru Friday (skip Saturday and Sunday)   Misc Natural Products (BLOOD SUGAR BALANCE PO) Take 1 capsule by mouth daily.    Multiple Vitamin (MULTIVITAMIN WITH MINERALS) TABS tablet Take 1 tablet by mouth daily with breakfast.   NON FORMULARY daily as needed. CBD capsules Pure organic hemp extract   pravastatin (PRAVACHOL) 40 MG  tablet One tab po M-F and skip Saturday/Sundays   valsartan (DIOVAN) 160 MG tablet Take 1 tablet (160 mg total) by mouth daily.   [DISCONTINUED] glucose blood test strip Use as instructed to check blood sugars twice daily   No facility-administered encounter medications on file as of 02/13/2021.    Patient Active Problem List   Diagnosis Date Noted   Pure hypercholesterolemia 06/17/2018   Type II diabetes mellitus, uncontrolled    Benign hypertension with chronic kidney disease, stage II 03/01/2018    Conditions to be addressed/monitored: Type 2 diabetes, Hypertensive nephropathy, Memory deficit, Vitamin B 12 deficiency   Care Plan : Dementia (Adult)  Updates made by Lynne Logan, RN since 02/13/2021 12:00 AM     Problem: Harm or Injury   Priority: High     Long-Range Goal: Harm or Injury Prevented   Start Date: 11/17/2020  Expected End Date: 11/17/2021  Recent Progress: On track  Priority: High  Note:   Current Barriers:  Ineffective Self Health Maintenance in a patient with Dementia Clinical Goal(s):  Collaboration with Glendale Chard, MD regarding development and update of comprehensive plan of care as evidenced by provider attestation and co-signature Inter-disciplinary care team collaboration (see longitudinal plan of care) patient will work with care management team to address care coordination and chronic disease management needs related to Disease Management Educational Needs Care Coordination Medication Management and Education Medication Reconciliation Psychosocial Support Dementia and Caregiver Support   Interventions:  02/13/21 Case Collaboration  Evaluation of current treatment plan related to Dementia, self-management and patient's adherence to plan as established by provider. Collaboration with Glendale Chard,  MD regarding development and update of comprehensive plan of care as evidenced by provider attestation       and co-signature Inter-disciplinary care  team collaboration (see longitudinal plan of care) Collaborated with embedded BSW [name] for patient status update: Mr. Pamela Key reports the patient remains active with home health and receives visits twice weekly to assist with medication management - patient has had difficulty following instructions of nurse and often forgets instructions in between visits Discussed the family would like a referral for podiatry to address a bunion on right foot - [name] reports the patient is having to wear a size larger to accommodate size of bunion Discussed the family would also like a referral for counseling for the patient to participate in once weekly to assist with coping of memory loss- Mr. Pamela Key advised SW he feels the patient is depressed and drinking more due to loss of memory BSW informed Mr. [place] SW would collaborate with the patients provider regarding desired referrals but she may need to complete and in office visit in order to have the requested referrals approved Discussed plans with patient for ongoing care management follow up and provided patient with direct contact information for care management team Self Care Activities:  Unable to self administer medications as prescribed Does not contact provider office for questions/concer Patient Goals: - engage with embedded BSW regarding PACE and MOW  - follow up on patient's Medicaid application status  - have a family discussion about how the patient's medication administration will be supervised to ensure medications are taken as prescribed        Follow Up Plan: Telephone follow up appointment with care management team member scheduled for: 04/13/21     Plan:Telephone follow up appointment with care management team member scheduled for:  04/13/21  Barb Merino, RN, BSN, CCM Care Management Coordinator [facility]  Direct Phone: [phone]

## 2021-03-01 NOTE — Patient Instructions (Signed)
Visit Information  PATIENT GOALS:  Goals Addressed      Dementia - Harm or Injury Prevented   On track    Timeframe:  Long-Range Goal Priority:  High Start Date:  11/17/20                           Expected End Date:  7/723  Next Scheduled Follow up: 04/13/21    Self Care Activities:  Unable to self administer medications as prescribed Does not contact provider office for questions/concer Patient Goals: - engage with embedded BSW regarding PACE and MOW  - follow up on patient's Medicaid application status         - have a family discussion about how the patient's medication administration will be supervised to ensure medications are taken as prescribed              The patient verbalized understanding of instructions, educational materials, and care plan provided today and declined offer to receive copy of patient instructions, educational materials, and care plan.   Telephone follow up appointment with care management team member scheduled for: 04/13/21  Delsa Sale, RN, BSN, CCM Care Management Coordinator [facility]  Direct Phone: [phone]

## 2021-03-02 ENCOUNTER — Telehealth: Payer: Medicare Other

## 2021-03-04 ENCOUNTER — Other Ambulatory Visit: Payer: Self-pay | Admitting: Internal Medicine

## 2021-03-04 DIAGNOSIS — N182 Chronic kidney disease, stage 2 (mild): Secondary | ICD-10-CM

## 2021-03-04 DIAGNOSIS — E1122 Type 2 diabetes mellitus with diabetic chronic kidney disease: Secondary | ICD-10-CM

## 2021-03-07 ENCOUNTER — Ambulatory Visit: Payer: Medicare Other | Admitting: Internal Medicine

## 2021-03-07 ENCOUNTER — Encounter: Payer: Self-pay | Admitting: Counselor

## 2021-03-07 ENCOUNTER — Other Ambulatory Visit: Payer: Self-pay

## 2021-03-07 ENCOUNTER — Ambulatory Visit (INDEPENDENT_AMBULATORY_CARE_PROVIDER_SITE_OTHER): Payer: BC Managed Care – PPO | Admitting: Counselor

## 2021-03-07 DIAGNOSIS — F02A18 Dementia in other diseases classified elsewhere, mild, with other behavioral disturbance: Secondary | ICD-10-CM

## 2021-03-07 DIAGNOSIS — G301 Alzheimer's disease with late onset: Secondary | ICD-10-CM

## 2021-03-07 NOTE — Progress Notes (Signed)
   NEUROPSYCHOLOGY FEEDBACK NOTE Pamela Key  Feedback Note: I met with Pamela Key to review the findings resulting from her neuropsychological evaluation. Since the last appointment, she has been well. Unfortunately, she presented to the visit alone. I asked if she would like to involve her children but she did not want to and thought they were at work. Time was spent reviewing the impressions and recommendations that are detailed in the evaluation report. We discussed impression of mild stage demntia, as reflected in the patient instructions. I was candid with her that she needs to accept help from her children. Went over cognitive strengths and weaknesses. I provided her with her diagnosis and recommendations on the AVS, so she can share with her family. I took time to explain the findings and answer all the patient's questions. I encouraged Pamela Key to contact me should she have any further questions or if further follow up is desired.   Current Medications and Medical History   Current Outpatient Medications  Medication Sig Dispense Refill   allopurinol (ZYLOPRIM) 100 MG tablet TAKE 1 TABLET BY MOUTH EVERY DAY (Patient not taking: Reported on 12/27/2020) 90 tablet 1   Blood Glucose Monitoring Suppl (ACCU-CHEK GUIDE) w/Device KIT Use to check blood sugars twice daily E11.65 1 kit 1   ferrous sulfate 325 (65 FE) MG tablet Take 325 mg by mouth See admin instructions. Take one tablet (325 mg) by mouth daily Monday thru Friday (skip Saturday and Sunday)     glucose blood (ACCU-CHEK GUIDE) test strip Use to check blood sugars twice daily E11.65 100 each 2   glucose blood test strip Use as instructed to check blood sugars twice daily 100 each 2   Misc Natural Products (BLOOD SUGAR BALANCE PO) Take 1 capsule by mouth daily.      Multiple Vitamin (MULTIVITAMIN WITH MINERALS) TABS tablet Take 1 tablet by mouth daily with breakfast.     NON FORMULARY daily as needed. CBD capsules Pure  organic hemp extract     pravastatin (PRAVACHOL) 40 MG tablet One tab po M-F and skip Saturday/Sundays 75 tablet 1   valsartan (DIOVAN) 160 MG tablet Take 1 tablet (160 mg total) by mouth daily. 90 tablet 1   No current facility-administered medications for this visit.    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 Speech was normal in rate, rhythm, volume, and prosody. Self-reported mood was "good" and affect was irritable at times. Thought process was logical and goal oriented, although there was notable forgetting of information and thought content was appropriate. There were no safety concerns identified at today's encounter, such as thoughts of harming self or others.   Plan  Feedback provided regarding the patient's neuropsychological evaluation. 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 Pamela Key was encouraged to contact me if any questions arise or if further follow up is desired.   Viviano Simas Nicole Kindred, PsyD, ABN Clinical Neuropsychologist  Service(s) Provided at This Encounter: 30 minutes [phone]; Psychotherapy with patient/family)
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
## 2021-03-07 NOTE — Patient Instructions (Signed)
Your performance and presentation on assessment were consistent with some difficulties in different areas, including on measures of memory and visual object confrontation naming. While your cognitive difficulties are not very severe, they are significant, and your family reported that you are having difficulties functioning in terms of reliably taking medications and the like. I think the term dementia is therefore the best descriptor of your current severity level.    Dementia refers to a group of syndromes where multiple areas of ability are damaged in the brain, such as memory, thinking, judgment, and behavior, and most commonly refers to age related causes of dementia that cause worsening in these abilities over time. Alzheimer's disease is the most common form of dementia in people over the age of 74. Not all dementias are due to Alzheimer's disease, but all Alzheimer's disease eventually results in dementia on a long enough timeline. When dementia is due to an underlying condition affecting the brain such as Alzheimer's disease, there is progression over time, which typically proceeds gradually over many years.    In your case, I think that your dementia is most likely due to Alzheimer's disease. This means there will likely be progression over time.    First, you could discuss anti dementia medications with [name], PA-C. These are medications that can potentially ameliorate symptoms in dementia even though they are not disease modifying.    Second, I think it is appropriate that you are getting help with medications and finances from your family. Often times, these are the first things people begin to have difficulty with in dementia, and therefore I think it is good you have some oversight. I would also encourage you to bring family with you to medical appointments, and perhaps also have them help in decision making.    Having dementia puts you at risk for adverse health outcomes, financial  problems, and other issues that could threaten your independence, many of which can be avoided entirely with appropriate support. The best way to maintain your independence is therefore to accept the help that you need to stop problems before they occur. I would recommend that you accept help from your family as needed with things like keeping track of your finances, transportation, making complex medical decisions and the like. At this point it is not so much because you cannot do those things yourself but as a safeguard to make sure that nothing is being overlooked.    I would recommend that you consult with an elder care attorney regarding advanced directives if you have not, such as a healthcare power of attorney and living will. Consultation with an elder care attorney can help you protect your estate and communicate your preferences to your loved ones formally, in the event you are not able to do so yourself. I can provide my strong recommendation for the [employer], who are located at [address] in [place], [address]. They can be reached at [phone]. They also have a website [email or web address].    Dementia is (somewhat arbitrarily) divided up into three different stages: Mild, Moderate, and Severe. These stages are not so much discrete stages as they are points on a continuum of severity. These stages govern the types of behavior you can expect, the level of care someone needs, and necessary supports. I think you have a mild dementia level of function. In the mild stages, difficulties are typically limited to cognitive problems, such as forgetfulness or difficulties coming up with the right  word. Individuals are often able to remain at home, safely operate a motor vehicle, and changes may not be obvious to the casual observer. They may have difficulties with more complex things such as managing finances, with driving, and with work if they are still working. These individuals are  often capable of living independently with minimal assistance, but do require some supervision with more complex tasks such as managing finances, arranging and remembering to take medications, and reporting symptoms at doctors appointments.    Often times, individuals with dementia are the last to be aware of their problems. The medical term for this lack of insight is called "anosognosia," which means absence of knowledge of illness. While their failure to recognize what seems obvious to everyone else can be frustrating, it is not their fault and is part of the disease itself. Trying to point out problems to the unaware individual is usually not helpful and is frequently harmful. What is helpful is to provide reassurance, redirect them, and avoid confronting them directly with the cognitive difficulties they are experiencing.  It is particularly important for individuals with anosognosia to get help planning for medical and other needs because they often are unaware of what their needs are.    Instead of medications, I recommend behavioral strategies for dealing with the behavioral and psychological issues that can accompany dementia. Things like agitation, wandering, and anxiety can often be improved or eliminated using the "three R's." Redirection (help distract your loved one by focusing their attention on something else, moving them to a new environment, or otherwise engaging them in something other than what is distressing to them), Reassurance (reassure them that you are there to take care of them and that there is nothing they need to be worried about), and Reconsidering (consider the situation from their perspective and try to identify if there is something about the situation or environment that may be triggering their reaction).   You can return for updated evaluation in 1 to 2 years as needed. I do not think you need to return for reevaluation unless you notice changes or find yourself in need of  further guidance that requires a neuropsychological opinion.

## 2021-03-08 ENCOUNTER — Ambulatory Visit: Payer: BC Managed Care – PPO | Admitting: Internal Medicine

## 2021-03-09 ENCOUNTER — Ambulatory Visit (INDEPENDENT_AMBULATORY_CARE_PROVIDER_SITE_OTHER): Payer: BC Managed Care – PPO | Admitting: Nurse Practitioner

## 2021-03-09 ENCOUNTER — Encounter: Payer: Self-pay | Admitting: Nurse Practitioner

## 2021-03-09 ENCOUNTER — Other Ambulatory Visit: Payer: Self-pay

## 2021-03-09 VITALS — BP 122/60 | HR 64 | Temp 98.7°F | Ht 62.0 in | Wt 190.0 lb

## 2021-03-09 DIAGNOSIS — M21611 Bunion of right foot: Secondary | ICD-10-CM

## 2021-03-09 DIAGNOSIS — N182 Chronic kidney disease, stage 2 (mild): Secondary | ICD-10-CM | POA: Diagnosis not present

## 2021-03-09 DIAGNOSIS — I129 Hypertensive chronic kidney disease with stage 1 through stage 4 chronic kidney disease, or unspecified chronic kidney disease: Secondary | ICD-10-CM | POA: Diagnosis not present

## 2021-03-09 DIAGNOSIS — E1122 Type 2 diabetes mellitus with diabetic chronic kidney disease: Secondary | ICD-10-CM | POA: Diagnosis not present

## 2021-03-09 DIAGNOSIS — F3289 Other specified depressive episodes: Secondary | ICD-10-CM

## 2021-03-09 DIAGNOSIS — M21619 Bunion of unspecified foot: Secondary | ICD-10-CM

## 2021-03-09 NOTE — Progress Notes (Signed)
I,Pamela Key,acting as a Education administrator for [employer], FNP.,have documented all relevant documentation on the behalf of Pamela Brine, FNP,as directed by  Pamela Brine, FNP while in the presence of Pamela Key, Minor.   This visit occurred during the SARS-CoV-2 public health emergency.  Safety protocols were in place, including screening questions prior to the visit, additional usage of staff PPE, and extensive cleaning of exam room while observing appropriate contact time as indicated for disinfecting solutions.  Subjective:     Patient ID: Pamela Key , female    DOB: 06-13-1940 , 80 y.o.   MRN: [id number]   Chief Complaint  Patient presents with   Bunions   Depression    HPI  The patient is here today for a referral to podiatry for bunions.  She has pain to her right foot bunions. They have been flaring more regularly in the last few days. No problem with walking.     Past Medical History:  Diagnosis Date   Diabetes mellitus without complication (HCC)    Gout    High cholesterol    Hypertension      Family History  Problem Relation Age of Onset   Diabetes Mother    Hypertension Mother    Cancer Father    Hypertension Father      Current Outpatient Medications:    Blood Glucose Monitoring Suppl (ACCU-CHEK GUIDE) w/Device KIT, Use to check blood sugars twice daily E11.65, Disp: 1 kit, Rfl: 1   ferrous sulfate 325 (65 FE) MG tablet, Take 325 mg by mouth See admin instructions. Take one tablet (325 mg) by mouth daily Monday thru Friday (skip Saturday and Sunday), Disp: , Rfl:    glucose blood (ACCU-CHEK GUIDE) test strip, Use to check blood sugars twice daily E11.65, Disp: 100 each, Rfl: 2   glucose blood test strip, Use as instructed to check blood sugars twice daily, Disp: 100 each, Rfl: 2   Misc Natural Products (BLOOD SUGAR BALANCE PO), Take 1 capsule by mouth daily. , Disp: , Rfl:    Multiple Vitamin (MULTIVITAMIN WITH MINERALS) TABS tablet, Take 1 tablet by mouth  daily with breakfast., Disp: , Rfl:    NON FORMULARY, daily as needed. CBD capsules Pure organic hemp extract, Disp: , Rfl:    pravastatin (PRAVACHOL) 40 MG tablet, One tab po M-F and skip Saturday/Sundays, Disp: 75 tablet, Rfl: 1   valsartan (DIOVAN) 160 MG tablet, Take 1 tablet (160 mg total) by mouth daily., Disp: 90 tablet, Rfl: 1   allopurinol (ZYLOPRIM) 100 MG tablet, TAKE 1 TABLET BY MOUTH EVERY DAY, Disp: 90 tablet, Rfl: 1   donepezil (ARICEPT) 10 MG tablet, Take half tablet (5 mg) daily for 2 weeks, then increase to the full tablet at 10 mg daily, Disp: 30 tablet, Rfl: 11   No Known Allergies   Review of Systems  Constitutional: Negative.   Respiratory: Negative.    Cardiovascular: Negative.   Gastrointestinal: Negative.   Psychiatric/Behavioral: Negative.    All other systems reviewed and are negative.   Today's Vitals   03/09/21 1506  BP: 122/60  Pulse: 64  Temp: 98.7 F (37.1 C)  Weight: 190 lb (86.2 kg)  Height: 5' 2"  (1.575 m)  PainSc: 4   PainLoc: Foot   Body mass index is 34.75 kg/m.  Wt Readings from Last 3 Encounters:  03/15/21 185 lb 6.4 oz (84.1 kg)  03/09/21 190 lb (86.2 kg)  01/24/21 190 lb 3.2 oz (86.3 kg)    BP Readings  from Last 3 Encounters:  03/15/21 (!) 211/78  03/09/21 122/60  02/07/21 120/84    Objective:  Physical Exam Vitals reviewed.  Constitutional:      Appearance: Normal appearance.  Cardiovascular:     Rate and Rhythm: Normal rate and regular rhythm.     Pulses: Normal pulses.     Heart sounds: Normal heart sounds. No murmur heard. Pulmonary:     Effort: Pulmonary effort is normal. No respiratory distress.     Breath sounds: Normal breath sounds. No wheezing.  Musculoskeletal:        General: No swelling, tenderness, deformity or signs of injury.     Right lower leg: No edema.     Left lower leg: No edema.     Comments: Right medial great toe with tender bunion present. Right 2nd metatarsal with bunion  Skin:    General:  Skin is warm and dry.  Neurological:     General: No focal deficit present.     Mental Status: She is alert and oriented to person, place, and time.     Cranial Nerves: No cranial nerve deficit.     Motor: No weakness.  Psychiatric:        Mood and Affect: Mood normal.        Behavior: Behavior normal.        Thought Content: Thought content normal.        Judgment: Judgment normal.        Assessment And Plan:     1. Type 2 diabetes mellitus with stage 2 chronic kidney disease, without long-term current use of insulin (HCC) Comments: No complaints, will check HgbA1c Encouraged to take medications as directed - BMP8+eGFR - Hemoglobin A1c  2. Hypertensive nephropathy Comments: Excellent control Continue current medications - BMP8+eGFR  3. Bunion of great toe Comments: She already has a referral made to Podiatry, has not been at this time  4. Other depression Comments: Referral made to psychology for her depression.   - Ambulatory referral to Psychology    Patient was given opportunity to ask questions. Patient verbalized understanding of the plan and was able to repeat key elements of the plan. All questions were answered to their satisfaction.  Pamela Brine, FNP     I, Pamela Brine, FNP, have reviewed all documentation for this visit. The documentation on 03/09/21 for the exam, diagnosis, procedures, and orders are all accurate and complete.  IF YOU HAVE BEEN REFERRED TO A SPECIALIST, IT MAY TAKE 1-2 WEEKS TO SCHEDULE/PROCESS THE REFERRAL. IF YOU HAVE NOT HEARD FROM US/SPECIALIST IN TWO WEEKS, PLEASE GIVE [place] A CALL AT [phone] X [phone].   THE PATIENT IS ENCOURAGED TO PRACTICE SOCIAL DISTANCING DUE TO THE COVID-19 PANDEMIC.

## 2021-03-09 NOTE — Patient Instructions (Signed)

## 2021-03-10 ENCOUNTER — Ambulatory Visit: Payer: Medicare Other

## 2021-03-10 DIAGNOSIS — N182 Chronic kidney disease, stage 2 (mild): Secondary | ICD-10-CM

## 2021-03-10 DIAGNOSIS — I129 Hypertensive chronic kidney disease with stage 1 through stage 4 chronic kidney disease, or unspecified chronic kidney disease: Secondary | ICD-10-CM

## 2021-03-10 DIAGNOSIS — R413 Other amnesia: Secondary | ICD-10-CM

## 2021-03-10 DIAGNOSIS — E1122 Type 2 diabetes mellitus with diabetic chronic kidney disease: Secondary | ICD-10-CM

## 2021-03-10 LAB — BMP8+EGFR
BUN/Creatinine Ratio: 20 (ref 12–28)
BUN: 16 mg/dL (ref 8–27)
CO2: 25 mmol/L (ref 20–29)
Calcium: 9.4 mg/dL (ref 8.7–10.3)
Chloride: 107 mmol/L — ABNORMAL HIGH (ref 96–106)
Creatinine, Ser: 0.79 mg/dL (ref 0.57–1.00)
Glucose: 89 mg/dL (ref 70–99)
Potassium: 4.2 mmol/L (ref 3.5–5.2)
Sodium: 145 mmol/L — ABNORMAL HIGH (ref 134–144)
eGFR: 77 mL/min/{1.73_m2} (ref >59)

## 2021-03-10 LAB — HEMOGLOBIN A1C
Est. average glucose Bld gHb Est-mCnc: 131 mg/dL
Hgb A1c MFr Bld: 6.2 % — ABNORMAL HIGH (ref 4.8–5.6)

## 2021-03-10 NOTE — Chronic Care Management (AMB) (Signed)
Chronic Care Management    Social Work Note  03/10/2021 Name: Pamela Key MRN: [id number] DOB: May 18, 1940  Pamela Key is a 80 y.o. year old female who is a primary care patient of Glendale Chard, MD. The CCM team was consulted to assist the patient with chronic disease management and/or care coordination needs related to:  DM II, Hypertensive Nephropathy, Mild Late Onset Alzheimer's Dementia .   Engaged with patients son Pamela Key by phone  for follow up visit in response to provider referral for social work chronic care management and care coordination services.   Consent to Services:  The patient was given information about Chronic Care Management services, agreed to services, and gave verbal consent prior to initiation of services.  Please see initial visit note for detailed documentation.   Patient agreed to services and consent obtained.   Assessment: Review of patient past medical history, allergies, medications, and health status, including review of relevant consultants reports was performed today as part of a comprehensive evaluation and provision of chronic care management and care coordination services.     SDOH (Social Determinants of Health) assessments and interventions performed:    Advanced Directives Status: Not addressed in this encounter.  CCM Care Plan  No Known Allergies  Outpatient Encounter Medications as of 03/10/2021  Medication Sig   allopurinol (ZYLOPRIM) 100 MG tablet TAKE 1 TABLET BY MOUTH EVERY DAY (Patient not taking: No sig reported)   Blood Glucose Monitoring Suppl (ACCU-CHEK GUIDE) w/Device KIT Use to check blood sugars twice daily E11.65   ferrous sulfate 325 (65 FE) MG tablet Take 325 mg by mouth See admin instructions. Take one tablet (325 mg) by mouth daily Monday thru Friday (skip Saturday and Sunday)   glucose blood (ACCU-CHEK GUIDE) test strip Use to check blood sugars twice daily E11.65   glucose blood test strip Use as  instructed to check blood sugars twice daily   Misc Natural Products (BLOOD SUGAR BALANCE PO) Take 1 capsule by mouth daily.    Multiple Vitamin (MULTIVITAMIN WITH MINERALS) TABS tablet Take 1 tablet by mouth daily with breakfast.   NON FORMULARY daily as needed. CBD capsules Pure organic hemp extract   pravastatin (PRAVACHOL) 40 MG tablet One tab po M-F and skip Saturday/Sundays   valsartan (DIOVAN) 160 MG tablet Take 1 tablet (160 mg total) by mouth daily.   No facility-administered encounter medications on file as of 03/10/2021.    Patient Active Problem List   Diagnosis Date Noted   Pure hypercholesterolemia 06/17/2018   Type II diabetes mellitus, uncontrolled    Benign hypertension with chronic kidney disease, stage II 03/01/2018    Conditions to be addressed/monitored:  DM II, Hypertensive Nephropathy, Mild Late Onset Alzheimer's Disease ; Limited access to caregiver and Memory Deficits  Care Plan : Social Work [facility] Care Plan  Updates made by [name] since 03/10/2021 12:00 AM     Problem: Safety - family concerned pt has dementia      Long-Range Goal: Home and Family Safety Maintained   Start Date: 09/29/2020  This Visit's Progress: On track  Recent Progress: On track  Priority: High  Note:   Current Barriers:  Chronic disease management support and education needs related to HTN and DM  Limited support system Cognitive Decline  Social Worker Clinical Goal(s):  Over the next 90 days the patient and her daughter will work with SW to identify resources to assist with patient care needs Over the next 10 days the patient  and her children will follow up on status of Medicaid application as directed by SW -Goal Met Over the next 30 days the patient will schedule an appointment with neurology -Goal Met SW Interventions:  Inter-disciplinary care team collaboration (see longitudinal plan of care) Collaboration with Glendale Chard, MD regarding development and update of  comprehensive plan of care as evidenced by provider attestation and co-signature Performed chart review to note new diagnosis of mild Alzheimer's Disease Successful outbound call placed to the patients son Pamela Key to follow up on care coordination needs Discussed patients son is frustrated as the patients god-daughter transported her to recent neuro psych appointment but patient refused to allow her to enter the exam room during appointment - Mr. [name] is able to access patients MyChart and did review note that patient has been diagnosed with Mild Alzheimer's Disease Provided verbal education surrounding the difference in mild, moderate, and severe Alzheimer's disease - discussed family needs to discuss plans for long-term care options for the patient as this is a progressive disease Advised the patient will need a plan in place as disease progresses as moderate Alzheimer's may involve the patient getting lost, not recognizing her own home, and forgetting the stove is on or if she eaten that day Discussed plans for SW to collaborate with the [facility] in home aid program to determine if patient is on this wait list. If not, SW will add the patient Discussed patient son will follow up with [facility] to determine if patient qualifies for this program given new diagnosis Reviewed family concern patient may need counseling to process new diagnosis - collaboration with [name] FNP who saw patient in clinic on 10.27 to review family request and determine if a referral is warranted at this time  Patient Goals/Self-Care Activities patient will: with the help of her family -Attend upcomming neurology appointment with Sharene Butters on 11/2 -Attend upcomming primary care appointments with Dr Baird Cancer on 11/16 and 11/29 -Engage with RN Care Manager during scheduled telephonic appointment on 12/1 -Follow up with [facility] regarding eligibility -Review provided materials on  stages of Alzheimer's Disease -Contact SW as needed prior to next scheduled call  Follow Up Plan:  SW will follow up with the patients son over the next 45 days       Follow Up Plan: SW will follow up with patient by phone over the next 45 days      [name], Arita Miss, CDP Social Worker, Certified Dementia Practitioner [place] [facility] [phone]

## 2021-03-10 NOTE — Patient Instructions (Addendum)
Social Worker Visit Information  Goals we discussed today:   Goals Addressed             This Visit's Progress    Home and Family Safety Maintained   On track    Timeframe:  Long-Range Goal Priority:  High Start Date:  5.19.22                                          Next planned outreach: 12.12.22  Patient Goals/Self-Care Activities patient will: with the help of her family -Attend upcomming neurology appointment with [name] on 11/2 -Attend upcomming primary care appointments with Dr Allyne Gee on 11/16 and 11/29 -Engage with RN Care Manager during scheduled telephonic appointment on 12/1 -Follow up with [facility] regarding eligibility -Review provided materials on stages of Alzheimer's Disease -Contact SW as needed prior to next scheduled call         Materials Provided: Yes: see attached educational materials on Alzheimer's Disease  Online educations resources for caregivers: [facility] [place] [email or web address]-watson.[email or web address]/ [place] [facility] [email or web address]  Stages of Alzheimer's Dementia (obtained from [employer] for educational purposes)  Early-stage Alzheimer's (mild) In the early stage of Alzheimer's, a person may function independently. He or she may still drive, work and be part of social activities. Despite this, the person may feel as if he or she is having memory lapses, such as forgetting familiar words or the location of everyday objects. Symptoms may not be widely apparent at this stage, but family and close friends may take notice and a doctor would be able to identify symptoms using certain diagnostic tools.  Common difficulties include: Coming up with the right word or name. Remembering names when introduced to new people. Having difficulty performing tasks in social or work settings. Forgetting material that was just read. Losing or misplacing a valuable object. Experiencing increased trouble with planning or  organizing.  Middle-stage Alzheimer's (moderate) Middle-stage Alzheimer's is typically the longest stage and can last for many years. As the disease progresses, the person with Alzheimer's will require a greater level of care.  During the middle stage of Alzheimer's, the dementia symptoms are more pronounced. the person may confuse words, get frustrated or angry, and act in unexpected ways, such as refusing to bathe. Damage to nerve cells in the brain can also make it difficult for the person to express thoughts and perform routine tasks without assistance. Symptoms, which vary from person to person, may include: Being forgetful of events or personal history. Feeling moody or withdrawn, especially in socially or mentally challenging situations. Being unable to recall information about themselves like their address or telephone number, and the high school or college they attended. Experiencing confusion about where they are or what day it is. Requiring help choosing proper clothing for the season or the occasion. Having trouble controlling their bladder and bowels. Experiencing changes in sleep patterns, such as sleeping during the day and becoming restless at night. Showing an increased tendency to wander and become lost. Demonstrating personality and behavioral changes, including suspiciousness and delusions or compulsive, repetitive behavior like hand-wringing or tissue shredding. In the middle stage, the person living with Alzheimer's can still participate in daily activities with assistance. It's important to find out what the person can still do or find ways to simplify tasks. As the need for more intensive care increases, caregivers may want to consider  respite care or an adult day center so they can have a temporary break from caregiving while the person living with Alzheimer's continues to receive care in a safe environment.  Late-stage Alzheimer's (severe) In the final stage of the  disease, dementia symptoms are severe. Individuals lose the ability to respond to their environment, to carry on a conversation and, eventually, to control movement. They may still say words or phrases, but communicating pain becomes difficult. As memory and cognitive skills continue to worsen, significant personality changes may take place and individuals need extensive care.  At this stage, individuals may: Require around-the-clock assistance with daily personal care. Lose awareness of recent experiences as well as of their surroundings. Experience changes in physical abilities, including walking, sitting and, eventually, swallowing Have difficulty communicating. Become vulnerable to infections, especially pneumonia. The person living with Alzheimer's may not be able to initiate engagement as much during the late stage, but he or she can still benefit from interaction in ways that are appropriate, like listening to relaxing music or receiving reassurance through gentle touch. During this stage, caregivers may want to use support services, such as hospice care, which focus on providing comfort and dignity at the end of life. Hospice can be of great benefit to people in the final stages of Alzheimer's and other dementias and their families.  Patient verbalizes understanding of instructions provided today and agrees to view in MyChart.   Follow Up Plan: SW will follow up with patient by phone over the next 45 days  [name], BSW, CDP Social Worker, Certified Dementia Practitioner [facility] Management [phone]

## 2021-03-13 DIAGNOSIS — E1122 Type 2 diabetes mellitus with diabetic chronic kidney disease: Secondary | ICD-10-CM | POA: Diagnosis not present

## 2021-03-13 DIAGNOSIS — N182 Chronic kidney disease, stage 2 (mild): Secondary | ICD-10-CM

## 2021-03-13 DIAGNOSIS — I129 Hypertensive chronic kidney disease with stage 1 through stage 4 chronic kidney disease, or unspecified chronic kidney disease: Secondary | ICD-10-CM | POA: Diagnosis not present
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
## 2021-03-15 ENCOUNTER — Ambulatory Visit (INDEPENDENT_AMBULATORY_CARE_PROVIDER_SITE_OTHER): Payer: BC Managed Care – PPO | Admitting: Physician Assistant

## 2021-03-15 ENCOUNTER — Encounter: Payer: Self-pay | Admitting: Physician Assistant

## 2021-03-15 ENCOUNTER — Other Ambulatory Visit: Payer: Self-pay

## 2021-03-15 VITALS — BP 211/78 | HR 71 | Ht 63.0 in | Wt 185.4 lb

## 2021-03-15 DIAGNOSIS — F02A18 Dementia in other diseases classified elsewhere, mild, with other behavioral disturbance: Secondary | ICD-10-CM

## 2021-03-15 DIAGNOSIS — G301 Alzheimer's disease with late onset: Secondary | ICD-10-CM | POA: Diagnosis not present

## 2021-03-15 MED ORDER — DONEPEZIL HCL 10 MG PO TABS: ORAL_TABLET | ORAL | 11 refills | Status: DC

## 2021-03-15 NOTE — Patient Instructions (Signed)
It was a pleasure to see you today at our office.   Recommendations:  Meds: Follow up in  6 months We will start donepezil half tablet (5mg) daily for 2  weeks.  If you are tolerating the medication, then after 2 weeks, we will increase the dose to a full tablet of 10 mg daily.  Side effects include nausea, vomiting, diarrhea, vivid dreams, and muscle cramps.  Please call the clinic if you experience any of these symptoms.    RECOMMENDATIONS FOR ALL PATIENTS WITH MEMORY PROBLEMS: 1. Continue to exercise (Recommend 30 minutes of walking everyday, or 3 hours every week) 2. Increase social interactions - continue going to Church and enjoy social gatherings with friends and family 3. Eat healthy, avoid fried foods and eat more fruits and vegetables 4. Maintain adequate blood pressure, blood sugar, and blood cholesterol level. Reducing the risk of stroke and cardiovascular disease also helps promoting better memory. 5. Avoid stressful situations. Live a simple life and avoid aggravations. Organize your time and prepare for the next day in anticipation. 6. Sleep well, avoid any interruptions of sleep and avoid any distractions in the bedroom that may interfere with adequate sleep quality 7. Avoid sugar, avoid sweets as there is a strong link between excessive sugar intake, diabetes, and cognitive impairment We discussed the Mediterranean diet, which has been shown to help patients reduce the risk of progressive memory disorders and reduces cardiovascular risk. This includes eating fish, eat fruits and green leafy vegetables, nuts like almonds and hazelnuts, walnuts, and also use olive oil. Avoid fast foods and fried foods as much as possible. Avoid sweets and sugar as sugar use has been linked to worsening of memory function.  There is always a concern of gradual progression of memory problems. If this is the case, then we may need to adjust level of care according to patient needs. Support, both to the  patient and caregiver, should then be put into place.    The Alzheimer's Association is here all day, every day for people facing Alzheimer's disease through our free 24/7 Helpline: [phone]. The Helpline provides reliable information and support to all those who need assistance, such as individuals living with memory loss, Alzheimer's or other dementia, caregivers, health care professionals and the public.  Our highly trained and knowledgeable staff can help you with: Understanding memory loss, dementia and Alzheimer's  Medications and other treatment options  General information about aging and brain health  Skills to provide quality care and to find the best care from professionals  Legal, financial and living-arrangement decisions Our Helpline also features: Confidential care consultation provided by master's level clinicians who can help with decision-making support, crisis assistance and education on issues families face every day  Help in a caller's preferred language using our translation service that features more than 200 languages and dialects  Referrals to local community programs, services and ongoing support     FALL PRECAUTIONS: Be cautious when walking. Scan the area for obstacles that may increase the risk of trips and falls. When getting up in the mornings, sit up at the edge of the bed for a few minutes before getting out of bed. Consider elevating the bed at the head end to avoid drop of blood pressure when getting up. Walk always in a well-lit room (use night lights in the walls). Avoid area rugs or power cords from appliances in the middle of the walkways. Use a walker or a cane if necessary and consider physical therapy   for balance exercise. Get your eyesight checked regularly.  FINANCIAL OVERSIGHT: Supervision, especially oversight when making financial decisions or transactions is also recommended.  HOME SAFETY: Consider the safety of the kitchen when operating  appliances like stoves, microwave oven, and blender. Consider having supervision and share cooking responsibilities until no longer able to participate in those. Accidents with firearms and other hazards in the house should be identified and addressed as well.   ABILITY TO BE LEFT ALONE: If patient is unable to contact 911 operator, consider using LifeLine, or when the need is there, arrange for someone to stay with patients. Smoking is a fire hazard, consider supervision or cessation. Risk of wandering should be assessed by caregiver and if detected at any point, supervision and safe proof recommendations should be instituted.  MEDICATION SUPERVISION: Inability to self-administer medication needs to be constantly addressed. Implement a mechanism to ensure safe administration of the medications.       Mediterranean Diet A Mediterranean diet refers to food and lifestyle choices that are based on the traditions of countries located on the [place] Sea. This way of eating has been shown to help prevent certain conditions and improve outcomes for people who have chronic diseases, like kidney disease and heart disease. What are tips for following this plan? Lifestyle  Cook and eat meals together with your family, when possible. Drink enough fluid to keep your urine clear or pale yellow. Be physically active every day. This includes: Aerobic exercise like running or swimming. Leisure activities like gardening, walking, or housework. Get 7-8 hours of sleep each night. If recommended by your health care provider, drink red wine in moderation. This means 1 glass a day for nonpregnant women and 2 glasses a day for men. A glass of wine equals 5 oz (150 mL). Reading food labels  Check the serving size of packaged foods. For foods such as rice and pasta, the serving size refers to the amount of cooked product, not dry. Check the total fat in packaged foods. Avoid foods that have saturated fat or  trans fats. Check the ingredients list for added sugars, such as corn syrup. Shopping  At the grocery store, buy most of your food from the areas near the walls of the store. This includes: Fresh fruits and vegetables (produce). Grains, beans, nuts, and seeds. Some of these may be available in unpackaged forms or large amounts (in bulk). Fresh seafood. Poultry and eggs. Low-fat dairy products. Buy whole ingredients instead of prepackaged foods. Buy fresh fruits and vegetables in-season from local farmers markets. Buy frozen fruits and vegetables in resealable bags. If you do not have access to quality fresh seafood, buy precooked frozen shrimp or canned fish, such as tuna, salmon, or sardines. Buy small amounts of raw or cooked vegetables, salads, or olives from the deli or salad bar at your store. Stock your pantry so you always have certain foods on hand, such as olive oil, canned tuna, canned tomatoes, rice, pasta, and beans. Cooking  Cook foods with extra-virgin olive oil instead of using butter or other vegetable oils. Have meat as a side dish, and have vegetables or grains as your main dish. This means having meat in small portions or adding small amounts of meat to foods like pasta or stew. Use beans or vegetables instead of meat in common dishes like chili or lasagna. Experiment with different cooking methods. Try roasting or broiling vegetables instead of steaming or sauteing them. Add frozen vegetables to soups, stews, pasta, or rice.   Add nuts or seeds for added healthy fat at each meal. You can add these to yogurt, salads, or vegetable dishes. Marinate fish or vegetables using olive oil, lemon juice, garlic, and fresh herbs. Meal planning  Plan to eat 1 vegetarian meal one day each week. Try to work up to 2 vegetarian meals, if possible. Eat seafood 2 or more times a week. Have healthy snacks readily available, such as: Vegetable sticks with hummus. Greek yogurt. Fruit and  nut trail mix. Eat balanced meals throughout the week. This includes: Fruit: 2-3 servings a day Vegetables: 4-5 servings a day Low-fat dairy: 2 servings a day Fish, poultry, or lean meat: 1 serving a day Beans and legumes: 2 or more servings a week Nuts and seeds: 1-2 servings a day Whole grains: 6-8 servings a day Extra-virgin olive oil: 3-4 servings a day Limit red meat and sweets to only a few servings a month What are my food choices? Mediterranean diet Recommended Grains: Whole-grain pasta. Brown rice. Bulgar wheat. Polenta. Couscous. Whole-wheat bread. Oatmeal. Quinoa. Vegetables: Artichokes. Beets. Broccoli. Cabbage. Carrots. Eggplant. Green beans. Chard. Kale. Spinach. Onions. Leeks. Peas. Squash. Tomatoes. Peppers. Radishes. Fruits: Apples. Apricots. Avocado. Berries. Bananas. Cherries. Dates. Figs. Grapes. Lemons. Melon. Oranges. Peaches. Plums. Pomegranate. Meats and other protein foods: Beans. Almonds. Sunflower seeds. Pine nuts. Peanuts. Cod. Salmon. Scallops. Shrimp. Tuna. Tilapia. Clams. Oysters. Eggs. Dairy: Low-fat milk. Cheese. Greek yogurt. Beverages: Water. Red wine. Herbal tea. Fats and oils: Extra virgin olive oil. Avocado oil. Grape seed oil. Sweets and desserts: Greek yogurt with honey. Baked apples. Poached pears. Trail mix. Seasoning and other foods: Basil. Cilantro. Coriander. Cumin. Mint. Parsley. Sage. Rosemary. Tarragon. Garlic. Oregano. Thyme. Pepper. Balsalmic vinegar. Tahini. Hummus. Tomato sauce. Olives. Mushrooms. Limit these Grains: Prepackaged pasta or rice dishes. Prepackaged cereal with added sugar. Vegetables: Deep fried potatoes (french fries). Fruits: Fruit canned in syrup. Meats and other protein foods: Beef. Pork. Lamb. Poultry with skin. Hot dogs. Bacon. Dairy: Ice cream. Sour cream. Whole milk. Beverages: Juice. Sugar-sweetened soft drinks. Beer. Liquor and spirits. Fats and oils: Butter. Canola oil. Vegetable oil. Beef fat (tallow).  Lard. Sweets and desserts: Cookies. Cakes. Pies. Candy. Seasoning and other foods: Mayonnaise. Premade sauces and marinades. The items listed may not be a complete list. Talk with your dietitian about what dietary choices are right for you. Summary The Mediterranean diet includes both food and lifestyle choices. Eat a variety of fresh fruits and vegetables, beans, nuts, seeds, and whole grains. Limit the amount of red meat and sweets that you eat. Talk with your health care provider about whether it is safe for you to drink red wine in moderation. This means 1 glass a day for nonpregnant women and 2 glasses a day for men. A glass of wine equals 5 oz (150 mL). This information is not intended to replace advice given to you by your health care provider. Make sure you discuss any questions you have with your health care provider. Document Released: 12/22/2015 Document Revised: 01/24/2016 Document Reviewed: 12/22/2015 Elsevier Interactive Patient Education  2017 [employer]     

## 2021-03-23 ENCOUNTER — Encounter: Payer: Self-pay | Admitting: Nurse Practitioner

## 2021-03-24 ENCOUNTER — Telehealth: Payer: Self-pay

## 2021-03-24 NOTE — Telephone Encounter (Signed)
  Care Management   Follow Up Note   03/24/2021 Name: Pamela Key MRN: [id number] DOB: December 10, 1940   Referred by: [name], MD Reason for referral : Chronic Care Management (Unsuccessful call)   An unsuccessful telephone outreach was attempted today. The patient was referred to the case management team for assistance with care management and care coordination. SW left a HIPAA compliant voice message requesting a return call.  Follow Up Plan: The care management team will reach out to the patient again over the next 21 days.   [name], BSW, CDP Social Worker, Certified Dementia Practitioner [facility] Care Management [phone]

## 2021-03-29 ENCOUNTER — Ambulatory Visit: Payer: Medicare Other | Admitting: Internal Medicine

## 2021-03-30 ENCOUNTER — Ambulatory Visit (INDEPENDENT_AMBULATORY_CARE_PROVIDER_SITE_OTHER): Payer: BC Managed Care – PPO

## 2021-03-30 ENCOUNTER — Ambulatory Visit (INDEPENDENT_AMBULATORY_CARE_PROVIDER_SITE_OTHER): Payer: BC Managed Care – PPO | Admitting: Sports Medicine

## 2021-03-30 ENCOUNTER — Other Ambulatory Visit: Payer: Self-pay

## 2021-03-30 DIAGNOSIS — M19079 Primary osteoarthritis, unspecified ankle and foot: Secondary | ICD-10-CM | POA: Diagnosis not present

## 2021-03-30 DIAGNOSIS — M21611 Bunion of right foot: Secondary | ICD-10-CM | POA: Diagnosis not present

## 2021-03-30 DIAGNOSIS — M2040 Other hammer toe(s) (acquired), unspecified foot: Secondary | ICD-10-CM

## 2021-03-30 DIAGNOSIS — E1165 Type 2 diabetes mellitus with hyperglycemia: Secondary | ICD-10-CM | POA: Diagnosis not present

## 2021-03-30 NOTE — Progress Notes (Signed)
Subjective: Pamela Key is a 80 y.o. female patient who presents to office for evaluation of Right> Left bunion pain. Patient complains of progressive pain especially over the last year in the Right>Left foot that starts as pain over the bump with direct pressure and range of motion; patient states that she has had these for years. Admits to a family history of bunions as well. Has tried soaking and changing shoes with some help. No other issues noted.   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    Current Outpatient Medications on File Prior to Visit  Medication Sig Dispense Refill   allopurinol (ZYLOPRIM) 100 MG tablet TAKE 1 TABLET BY MOUTH EVERY DAY 90 tablet 1   Blood Glucose Monitoring Suppl (ACCU-CHEK GUIDE) w/Device KIT Use to check blood sugars twice daily E11.65 1 kit 1   donepezil (ARICEPT) 10 MG tablet Take half tablet (5 mg) daily for 2 weeks, then increase to the full tablet at 10 mg daily 30 tablet 11   ferrous sulfate 325 (65 FE) MG tablet Take 325 mg by mouth See admin instructions. Take one tablet (325 mg) by mouth daily Monday thru Friday (skip Saturday and Sunday)     glucose blood (ACCU-CHEK GUIDE) test strip Use to check blood sugars twice daily E11.65 100 each 2   glucose blood test strip Use as instructed to check blood sugars twice daily 100 each 2   Misc Natural Products (BLOOD SUGAR BALANCE PO) Take 1 capsule by mouth daily.      Multiple Vitamin (MULTIVITAMIN WITH MINERALS) TABS tablet Take 1 tablet by mouth daily with breakfast.     NON FORMULARY daily as needed. CBD capsules Pure organic hemp extract     pravastatin (PRAVACHOL) 40 MG tablet One tab po M-F and skip Saturday/Sundays 75 tablet 1   valsartan (DIOVAN) 160 MG tablet Take 1 tablet (160 mg total) by mouth daily. 90 tablet 1    No current facility-administered medications on file prior to visit.    No Known Allergies  Objective:  General: Alert and oriented x3 in no acute distress  Dermatology: No open lesions bilateral lower extremities, no webspace macerations, no ecchymosis bilateral, all nails x 10 are well manicured.  Vascular: Dorsalis Pedis and Posterior Tibial pedal pulses 1/4, Capillary Fill Time 3 seconds, (+) pedal hair growth bilateral, no edema bilateral lower extremities, Temperature gradient within normal limits.  Neurology: Johney Maine sensation intact via light touch bilateral.   Musculoskeletal: Mild tenderness with palpation right>left bunion deformity, end stage bunion with restricted motion at 1st MTPJ on right>left. Hammertoe.   Xrays  Right Foot    Impression: Intermetatarsal angle above normal limits arthritis at 1st MTPJ, lesser digital contracture.       Assessment and Plan: Problem List Items Addressed This Visit       Endocrine   Uncontrolled diabetes mellitus with hyperglycemia ([place])   Other Visit Diagnoses     Bunion of great toe of right foot    -  Primary   Relevant Orders   DG Foot Complete Right   Arthritis of big toe       Hammer toe, unspecified laterality             -Complete examination performed -Xrays reviewed -Discussed treatement options; discussed HAV and hammertoe deformity;conservative and  Surgical management; risks,  benefits, alternatives discussed. All patient's questions answered. 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  Landis Martins, DPM

## 2021-03-31 ENCOUNTER — Ambulatory Visit (INDEPENDENT_AMBULATORY_CARE_PROVIDER_SITE_OTHER): Payer: BC Managed Care – PPO

## 2021-03-31 DIAGNOSIS — I129 Hypertensive chronic kidney disease with stage 1 through stage 4 chronic kidney disease, or unspecified chronic kidney disease: Secondary | ICD-10-CM

## 2021-03-31 DIAGNOSIS — N182 Chronic kidney disease, stage 2 (mild): Secondary | ICD-10-CM

## 2021-03-31 DIAGNOSIS — R413 Other amnesia: Secondary | ICD-10-CM

## 2021-03-31 NOTE — Chronic Care Management (AMB) (Signed)
Chronic Care Management    Social Work Note  03/31/2021 Name: Pamela Key MRN: [id number] DOB: 02/17/1941  Pamela Key is a 80 y.o. year old female who is a primary care patient of Glendale Chard, MD. The CCM team was consulted to assist the patient with chronic disease management and/or care coordination needs related to:  DM II, Hypertensive Nephropathy, Mild Late Onset Alzheimer's Dementia .   Engaged with patient by telephone for follow up visit in response to provider referral for social work chronic care management and care coordination services.   Consent to Services:  The patient was given information about Chronic Care Management services, agreed to services, and gave verbal consent prior to initiation of services.  Please see initial visit note for detailed documentation.   Patient agreed to services and consent obtained.   Assessment: Review of patient past medical history, allergies, medications, and health status, including review of relevant consultants reports was performed today as part of a comprehensive evaluation and provision of chronic care management and care coordination services.     SDOH (Social Determinants of Health) assessments and interventions performed:  SDOH Interventions    Flowsheet Row Most Recent Value  SDOH Interventions   Food Insecurity Interventions Intervention Not Indicated  Housing Interventions Intervention Not Indicated  Transportation Interventions Intervention Not Indicated        Advanced Directives Status: Not addressed in this encounter.  CCM Care Plan  No Known Allergies  Outpatient Encounter Medications as of 03/31/2021  Medication Sig   allopurinol (ZYLOPRIM) 100 MG tablet TAKE 1 TABLET BY MOUTH EVERY DAY   Blood Glucose Monitoring Suppl (ACCU-CHEK GUIDE) w/Device KIT Use to check blood sugars twice daily E11.65   donepezil (ARICEPT) 10 MG tablet Take half tablet (5 mg) daily for 2 weeks, then increase to the  full tablet at 10 mg daily   ferrous sulfate 325 (65 FE) MG tablet Take 325 mg by mouth See admin instructions. Take one tablet (325 mg) by mouth daily Monday thru Friday (skip Saturday and Sunday)   glucose blood (ACCU-CHEK GUIDE) test strip Use to check blood sugars twice daily E11.65   glucose blood test strip Use as instructed to check blood sugars twice daily   Misc Natural Products (BLOOD SUGAR BALANCE PO) Take 1 capsule by mouth daily.    Multiple Vitamin (MULTIVITAMIN WITH MINERALS) TABS tablet Take 1 tablet by mouth daily with breakfast.   NON FORMULARY daily as needed. CBD capsules Pure organic hemp extract   pravastatin (PRAVACHOL) 40 MG tablet One tab po M-F and skip Saturday/Sundays   valsartan (DIOVAN) 160 MG tablet Take 1 tablet (160 mg total) by mouth daily.   No facility-administered encounter medications on file as of 03/31/2021.    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    Conditions to be addressed/monitored:  DM II, Hypertensive Nephropathy, Mild Late Onset Alzheimer's Dementia ; Limited access to caregiver  Care Plan : Social Work [place]  Updates made by [name] since 03/31/2021 12:00 AM     Problem: Safety - family concerned pt has dementia      Long-Range Goal: Home and Family Safety Maintained   Start Date: 09/29/2020  This Visit's Progress: On track  Recent Progress: On track  Priority: High  Note:   Current Barriers:  Chronic disease  management support and education needs related to HTN and DM  Limited support system Cognitive Decline  Social Worker Clinical Goal(s):  Over the next 90 days the patient and her daughter will work with SW to identify resources to assist with patient care needs Over the next 10 days the patient and her children will follow up  on status of Medicaid application as directed by SW -Goal Met Over the next 30 days the patient will schedule an appointment with neurology -Goal Met SW Interventions:  Inter-disciplinary care team collaboration (see longitudinal plan of care) Collaboration with Glendale Chard, MD regarding development and update of comprehensive plan of care as evidenced by provider attestation and co-signature Telephonic visit completed with the patient today SDoH screening performed - no acute challenges identified at this time. The patient reports her grandson is staying in her spare room and assists with groceries. Her grand-daughter assists with transportation to most appointments. Patient is aware she is approved to access [employer] for transportation but notes her family prefers to provide transportation rather than her using the bus Performed chart review to note patient has an [place] appointment with her primary care provider on 11/29 - patient reports she is aware of this appointment and has it on her calendar Discussed the patient has implemented using a calendar in her home so she does not forget about appointments Encouraged patient to continue using a calendar as a reminder of planned events and appointments Discussed the patient stays in contact with her son daily - patient gives this SW verbal permission to contact her son Pamela Key to discuss care coordination needs Successful outbound call to the patients son to follow up on care coordination needs Advised SW received a response from the [facility] in home aid program advising patient is currently 32 on the waiting list Discussed the office specialist Inda Merlin is requesting an updated contact number to contact the patient for an assessment when it is her turn to receive services Determined Pamela Key would like his contact number to be primary contact on behalf of the patient Advised Pamela Key the patient recently updated  her DPR to only list Duncan Dull which in turn voided her previous DPR which had both the patients son and daughter listed Mr. Chatham reports his mother was transported to her recent neurology appointment by Duncan Dull and wanted to add her to the [facility] so she could attend the appointment with the patient  Advised Mr. Nederland that if the patient wants her children as well as Mrs. Lawrence on her DPR she could update a DPR during her 11/29 appointment to list all three persons Collaboration with Inda Merlin providing Mr. Washington's contact number to schedule an assessment for the in home aid wait list  Patient Goals/Self-Care Activities patient will: with the help of her family -Attend upcomming primary care appointments with Dr Baird Cancer on 11/29 -Update DPR as desired during 11/29 appointment -Engage with [place] during scheduled telephonic appointment on 12/1 -Follow up with [facility] regarding eligibility -Review provided materials on stages of Alzheimer's Disease -Contact SW as needed prior to next scheduled call  Follow Up Plan:  SW will follow up with the patients son over the next 45 days       Follow Up Plan: SW will follow up with patient by phone over the next 45 days      [name], BSW, CDP Social Worker, Certified Dementia Practitioner [place] [facility] [place] [facility] [phone]

## 2021-03-31 NOTE — Patient Instructions (Signed)
Social Worker Visit Information  Goals we discussed today:   Goals Addressed             This Visit's Progress    Home and Family Safety Maintained   On track    Timeframe:  Long-Range Goal Priority:  High Start Date:  5.19.22                                          Next planned outreach: 12.12.22  Patient Goals/Self-Care Activities patient will: with the help of her family -Attend upcomming primary care appointments with Dr Allyne Gee on 11/29 -Update DPR as desired during 11/29 appointment -Engage with RN Care Manager during scheduled telephonic appointment on 12/1 -Follow up with [facility] regarding eligibility -Review provided materials on stages of Alzheimer's Disease -Contact SW as needed prior to next scheduled call         Materials Provided: Verbal education about community resource provided by phone  Patient verbalizes understanding of instructions provided today and agrees to view in [place].   Follow Up Plan: SW will follow up with patient by phone over the next 45 days   [name], BSW, CDP Social Worker, Certified Dementia Practitioner [facility] Care Management [phone]

## 2021-04-07 ENCOUNTER — Encounter: Payer: Self-pay | Admitting: Internal Medicine

## 2021-04-10 ENCOUNTER — Ambulatory Visit: Payer: Medicare Other

## 2021-04-10 DIAGNOSIS — I129 Hypertensive chronic kidney disease with stage 1 through stage 4 chronic kidney disease, or unspecified chronic kidney disease: Secondary | ICD-10-CM

## 2021-04-10 DIAGNOSIS — N182 Chronic kidney disease, stage 2 (mild): Secondary | ICD-10-CM

## 2021-04-10 DIAGNOSIS — R413 Other amnesia: Secondary | ICD-10-CM

## 2021-04-10 NOTE — Chronic Care Management (AMB) (Signed)
Chronic Care Management    Social Work Note  04/10/2021 Name: Pamela Key MRN: [id number] DOB: 27-Dec-1940  Pamela Key is a 80 y.o. year old female who is a primary care patient of Glendale Chard, MD. The CCM team was consulted to assist the patient with chronic disease management and/or care coordination needs related to:  DM II, Hypertensive Nephropathy, Mild Late Onset Alzheimer's Dementia .   Engaged with patients son [name] by phone  for follow up visit in response to provider referral for social work chronic care management and care coordination services.   Consent to Services:  The patient was given information about Chronic Care Management services, agreed to services, and gave verbal consent prior to initiation of services.  Please see initial visit note for detailed documentation.   Patient agreed to services and consent obtained.   Assessment: Review of patient past medical history, allergies, medications, and health status, including review of relevant consultants reports was performed today as part of a comprehensive evaluation and provision of chronic care management and care coordination services.     SDOH (Social Determinants of Health) assessments and interventions performed:    Advanced Directives Status: Not addressed in this encounter.  CCM Care Plan  No Known Allergies  Outpatient Encounter Medications as of 04/10/2021  Medication Sig   allopurinol (ZYLOPRIM) 100 MG tablet TAKE 1 TABLET BY MOUTH EVERY DAY   Blood Glucose Monitoring Suppl (ACCU-CHEK GUIDE) w/Device KIT Use to check blood sugars twice daily E11.65   donepezil (ARICEPT) 10 MG tablet Take half tablet (5 mg) daily for 2 weeks, then increase to the full tablet at 10 mg daily   ferrous sulfate 325 (65 FE) MG tablet Take 325 mg by mouth See admin instructions. Take one tablet (325 mg) by mouth daily Monday thru Friday (skip Saturday and Sunday)   glucose blood (ACCU-CHEK GUIDE)  test strip Use to check blood sugars twice daily E11.65   glucose blood test strip Use as instructed to check blood sugars twice daily   Misc Natural Products (BLOOD SUGAR BALANCE PO) Take 1 capsule by mouth daily.    Multiple Vitamin (MULTIVITAMIN WITH MINERALS) TABS tablet Take 1 tablet by mouth daily with breakfast.   NON FORMULARY daily as needed. CBD capsules Pure organic hemp extract   pravastatin (PRAVACHOL) 40 MG tablet One tab po M-F and skip Saturday/Sundays   valsartan (DIOVAN) 160 MG tablet Take 1 tablet (160 mg total) by mouth daily.   No facility-administered encounter medications on file as of 04/10/2021.    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    Conditions to be addressed/monitored:  DM II, Hypertensive Nephropathy, Mild Late Onset Alzheimer's Dementia  Care Plan : Social Work [place]  Updates made by [name] since 04/10/2021 12:00 AM     Problem: Safety - family concerned pt has dementia      Long-Range Goal: Home and Family Safety Maintained   Start Date: 09/29/2020  This Visit's Progress: On track  Recent Progress: On track  Priority: High  Note:   Current Barriers:  Chronic disease management support and education needs related to HTN and DM  Limited support system Cognitive Decline  Social Worker Clinical Goal(s):  Over the next 90 days the patient and her daughter will work with SW to identify resources  to assist with patient care needs Over the next 10 days the patient and her children will follow up on status of Medicaid application as directed by SW -Goal Met Over the next 30 days the patient will schedule an appointment with neurology -Goal Met SW Interventions:  Inter-disciplinary care team collaboration (see longitudinal plan of care) Collaboration  with Glendale Chard, MD regarding development and update of comprehensive plan of care as evidenced by provider attestation and co-signature Inbound call received from patients son [facility] who the patient has given verbal permission to this SW to speak with [name] contacted SW to follow up on status of patients referral to counseling Advised the patients referral was placed on 11/10 but when the patient was contacted she declined services stating she did not have transportation. Patient also reported she could not participate in a virtual visit due to lack of a computer Discussed the patient has access to [employer] transportation as well as local family who assist with transporting her to appointments. [name] also advises patient has a smart phone and is aware she can participate in virtual visits on this device Advised SW will plan to contact the patient this week to assess interest in participating with counseling Noted patient has an office visit scheduled on 11/29 with her primary provider - Mr. [place] reports the patient has both a medical and health care power of attorney but they may also be interested in completed an advance directive Provided verbal education on what an advance directive is and when it is used Discussed the patient may ask her provider for a blank copy if she is interested in completing one  Patient Goals/Self-Care Activities patient will: with the help of her family -Attend upcomming primary care appointments with Dr Baird Cancer on 11/29 -Update DPR as desired during 11/29 appointment -Engage with RN Care Manager during scheduled telephonic appointment on 12/1 -Follow up with [facility] regarding eligibility -Review provided materials on stages of Alzheimer's Disease -Contact SW as needed prior to next scheduled call  Follow Up Plan:  SW will follow up with the patient on 12.1.22       Follow Up Plan: SW will follow up with patient by  phone over the next week.      [name], BSW, CDP Social Worker, Certified Dementia Practitioner [place] [facility] [place] [facility] [phone]

## 2021-04-10 NOTE — Patient Instructions (Signed)
Social Worker Visit Information  Goals we discussed today:   Goals Addressed             This Visit's Progress    Home and Family Safety Maintained   On track    Timeframe:  Long-Range Goal Priority:  High Start Date:  5.19.22                                          Next planned outreach: 12.1.22  Patient Goals/Self-Care Activities patient will: with the help of her family -Attend upcomming primary care appointments with Dr Allyne Gee on 11/29 -Update DPR as desired during 11/29 appointment -Engage with RN Care Manager during scheduled telephonic appointment on 12/1 -Follow up with [facility] regarding eligibility -Review provided materials on stages of Alzheimer's Disease -Contact SW as needed prior to next scheduled call         Patient verbalizes understanding of instructions provided today and agrees to view in MyChart.   Follow Up Plan: SW will follow up with patient by phone over the next week.  [name], BSW, CDP Social Worker, Certified Dementia Practitioner [facility] Care Management [phone]

## 2021-04-11 ENCOUNTER — Other Ambulatory Visit: Payer: Self-pay

## 2021-04-11 ENCOUNTER — Ambulatory Visit (INDEPENDENT_AMBULATORY_CARE_PROVIDER_SITE_OTHER): Payer: BC Managed Care – PPO | Admitting: Internal Medicine

## 2021-04-11 ENCOUNTER — Encounter: Payer: Self-pay | Admitting: Internal Medicine

## 2021-04-11 VITALS — BP 120/70 | HR 68 | Temp 97.8°F | Ht 63.0 in | Wt 189.8 lb

## 2021-04-11 DIAGNOSIS — Z6833 Body mass index (BMI) 33.0-33.9, adult: Secondary | ICD-10-CM

## 2021-04-11 DIAGNOSIS — I129 Hypertensive chronic kidney disease with stage 1 through stage 4 chronic kidney disease, or unspecified chronic kidney disease: Secondary | ICD-10-CM | POA: Diagnosis not present

## 2021-04-11 DIAGNOSIS — E1122 Type 2 diabetes mellitus with diabetic chronic kidney disease: Secondary | ICD-10-CM | POA: Diagnosis not present

## 2021-04-11 DIAGNOSIS — E6609 Other obesity due to excess calories: Secondary | ICD-10-CM

## 2021-04-11 DIAGNOSIS — M1A3720 Chronic gout due to renal impairment, left ankle and foot, without tophus (tophi): Secondary | ICD-10-CM | POA: Diagnosis not present

## 2021-04-11 DIAGNOSIS — Z0000 Encounter for general adult medical examination without abnormal findings: Secondary | ICD-10-CM

## 2021-04-11 DIAGNOSIS — Z23 Encounter for immunization: Secondary | ICD-10-CM | POA: Diagnosis not present

## 2021-04-11 DIAGNOSIS — N182 Chronic kidney disease, stage 2 (mild): Secondary | ICD-10-CM

## 2021-04-11 LAB — POCT URINALYSIS DIPSTICK
Bilirubin, UA: NEGATIVE
Glucose, UA: NEGATIVE
Ketones, UA: NEGATIVE
Leukocytes, UA: NEGATIVE
Nitrite, UA: NEGATIVE
Protein, UA: NEGATIVE
Spec Grav, UA: 1.02 (ref 1.010–1.025)
Urobilinogen, UA: 0.2 E.U./dL
pH, UA: 6 (ref 5.0–8.0)

## 2021-04-11 LAB — POCT UA - MICROALBUMIN
Albumin/Creatinine Ratio, Urine, POC: <30
Creatinine, POC: 100 mg/dL
Microalbumin Ur, POC: 10 mg/L

## 2021-04-11 NOTE — Patient Instructions (Signed)

## 2021-04-11 NOTE — Progress Notes (Signed)
I,Tianna Badgett,acting as a Education administrator for [name], MD.,have documented all relevant documentation on the behalf of [name], MD,as directed by  [name], MD while in the presence of [name], MD.  This visit occurred during the SARS-CoV-2 public health emergency.  Safety protocols were in place, including screening questions prior to the visit, additional usage of staff PPE, and extensive cleaning of exam room while observing appropriate contact time as indicated for disinfecting solutions.  Subjective:     Patient ID: Pamela Key , female    DOB: [birth year] , 80 y.o.   MRN: [id number]   Chief Complaint  Patient presents with   Annual Exam   Diabetes   Hypertension    HPI  She presents today for physical exam. She denies having any specific concerns/complaints at this time. She reports compliance with meds. She was advised her insurance does not cover PE, she does not wish to have CPE performed.   Hypertension This is a chronic problem. The current episode started more than 1 year ago. The problem has been gradually improving since onset. The problem is controlled. Pertinent negatives include no blurred vision or chest pain. Risk factors for coronary artery disease include diabetes mellitus, dyslipidemia, post-menopausal state, obesity and sedentary lifestyle. Past treatments include ACE inhibitors. The current treatment provides moderate improvement. Compliance problems include exercise.  Hypertensive end-organ damage includes kidney disease.  Diabetes She presents for her follow-up diabetic visit. She has type 2 diabetes mellitus. Her disease course has been stable. There are no hypoglycemic associated symptoms. There are no diabetic associated symptoms. Pertinent negatives for diabetes include no blurred vision and no chest pain. There are no hypoglycemic complications. There are no diabetic complications. Risk factors for coronary artery disease include  diabetes mellitus, dyslipidemia and hypertension. She is following a generally healthy diet. She participates in exercise intermittently. Her home blood glucose trend is fluctuating minimally. Her breakfast blood glucose is taken between 8-9 am. Her breakfast blood glucose range is generally 90-110 mg/dl. An ACE inhibitor/angiotensin II receptor blocker is being taken. She does not see a podiatrist.Eye exam is not current.    Past Medical History:  Diagnosis Date   Diabetes mellitus without complication ([place])    Gout    High cholesterol    Hypertension      Family History  Problem Relation Age of Onset   Diabetes Mother    Hypertension Mother    Cancer Father    Hypertension Father      Current Outpatient Medications:    allopurinol (ZYLOPRIM) 100 MG tablet, TAKE 1 TABLET BY MOUTH EVERY DAY, Disp: 90 tablet, Rfl: 1   Blood Glucose Monitoring Suppl (ACCU-CHEK GUIDE) w/Device KIT, Use to check blood sugars twice daily E11.65, Disp: 1 kit, Rfl: 1   donepezil (ARICEPT) 10 MG tablet, Take half tablet (5 mg) daily for 2 weeks, then increase to the full tablet at 10 mg daily, Disp: 30 tablet, Rfl: 11   ferrous sulfate 325 (65 FE) MG tablet, Take 325 mg by mouth See admin instructions. Take one tablet (325 mg) by mouth daily Monday thru Friday (skip Saturday and Sunday), Disp: , Rfl:    glucose blood (ACCU-CHEK GUIDE) test strip, Use to check blood sugars twice daily E11.65, Disp: 100 each, Rfl: 2   glucose blood test strip, Use as instructed to check blood sugars twice daily, Disp: 100 each, Rfl: 2   Misc Natural Products (BLOOD SUGAR BALANCE PO), Take 1 capsule  by mouth daily. , Disp: , Rfl:    Multiple Vitamin (MULTIVITAMIN WITH MINERALS) TABS tablet, Take 1 tablet by mouth daily with breakfast., Disp: , Rfl:    NON FORMULARY, daily as needed. CBD capsules Pure organic hemp extract, Disp: , Rfl:    pravastatin (PRAVACHOL) 40 MG tablet, One tab po M-F and skip Saturday/Sundays, Disp: 75  tablet, Rfl: 1   valsartan (DIOVAN) 160 MG tablet, Take 1 tablet (160 mg total) by mouth daily., Disp: 90 tablet, Rfl: 1   No Known Allergies    The patient states she uses post menopausal status for birth control. Last LMP was No LMP recorded. Patient is postmenopausal.. Negative for Dysmenorrhea. Negative for: breast discharge, breast lump(s), breast pain and breast self exam. Associated symptoms include abnormal vaginal bleeding. Pertinent negatives include abnormal bleeding (hematology), anxiety, decreased libido, depression, difficulty falling sleep, dyspareunia, history of infertility, nocturia, sexual dysfunction, sleep disturbances, urinary incontinence, urinary urgency, vaginal discharge and vaginal itching. Diet regular.The patient states her exercise level is    . The patient's tobacco use is:  Social History   Tobacco Use  Smoking Status Former   Packs/day: 0.75   Years: 15.00   Pack years: 11.25   Types: Cigarettes   Start date: 1961   Quit date: 1976   Years since quitting: 46.9  Smokeless Tobacco Never  Tobacco Comments   smoked at least 10 years, can't remember exact years  . She has been exposed to passive smoke. The patient's alcohol use is:  Social History   Substance and Sexual Activity  Alcohol Use Yes   Comment: on occassion wine   Review of Systems  Constitutional: Negative.   HENT: Negative.    Eyes: Negative.  Negative for blurred vision.  Respiratory: Negative.    Cardiovascular: Negative.  Negative for chest pain.  Gastrointestinal: Negative.   Endocrine: Negative.   Genitourinary: Negative.   Musculoskeletal: Negative.   Skin: Negative.   Allergic/Immunologic: Negative.   Neurological: Negative.   Hematological: Negative.   Psychiatric/Behavioral: Negative.      Today's Vitals   04/11/21 1408  BP: 120/70  Pulse: 68  Temp: 97.8 F (36.6 C)  TempSrc: Oral  Weight: 189 lb 12.8 oz (86.1 kg)  Height: 5' 3"  (1.6 m)   Body mass index is  33.62 kg/m.  Wt Readings from Last 3 Encounters:  04/11/21 189 lb 12.8 oz (86.1 kg)  03/15/21 185 lb 6.4 oz (84.1 kg)  03/09/21 190 lb (86.2 kg)    Objective:  Physical Exam Vitals and nursing note reviewed.  Constitutional:      Appearance: Normal appearance.  HENT:     Head: Normocephalic and atraumatic.     Right Ear: Tympanic membrane, ear canal and external ear normal.     Left Ear: Tympanic membrane, ear canal and external ear normal.     Nose: Nose normal.     Mouth/Throat:     Mouth: Mucous membranes are moist.     Pharynx: Oropharynx is clear.  Eyes:     Extraocular Movements: Extraocular movements intact.     Conjunctiva/sclera: Conjunctivae normal.     Pupils: Pupils are equal, round, and reactive to light.  Cardiovascular:     Rate and Rhythm: Normal rate and regular rhythm.     Pulses: Normal pulses.          Dorsalis pedis pulses are 2+ on the right side and 2+ on the left side.     Heart sounds: Normal heart  sounds.  Pulmonary:     Effort: Pulmonary effort is normal.     Breath sounds: Normal breath sounds.  Abdominal:     General: Abdomen is flat. Bowel sounds are normal.     Palpations: Abdomen is soft.  Genitourinary:    Comments: deferred Musculoskeletal:        General: Normal range of motion.     Cervical back: Normal range of motion and neck supple.  Feet:     Right foot:     Protective Sensation: 5 sites tested.  5 sites sensed.     Skin integrity: Dry skin present.     Toenail Condition: Right toenails are long.     Left foot:     Protective Sensation: 5 sites tested.  5 sites sensed.     Skin integrity: Callus and dry skin present.     Toenail Condition: Left toenails are long.  Skin:    General: Skin is warm and dry.  Neurological:     General: No focal deficit present.     Mental Status: She is alert and oriented to person, place, and time.  Psychiatric:        Mood and Affect: Mood normal.        Behavior: Behavior normal.         Assessment And Plan:     1. Type 2 diabetes mellitus with stage 2 chronic kidney disease, without long-term current use of insulin (HCC) Comments: Diabetic foot exam was performed. I will check labs and adjust meds as needed. She will rto in 3-4 months for re-evaluation. - POCT Urinalysis Dipstick (81002) - POCT UA - Microalbumin - Lipid panel - Ambulatory referral to Optometry  2. Hypertensive nephropathy Comments: Chronic, well controlled. EKG performed, NSR w/o acute changes. NO med changes. She is encouraged to follow low sodium diet.  - EKG 12-Lead  3. 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 She is encouraged to stay well hydrated and avoid triggers.  - Uric acid  4. Class 1 obesity due to excess calories with serious comorbidity and body mass index (BMI) of 33.0 to 33.9 in adult Comments: She is encouraged to strive for BMI less than 30 to decrease cardiac risk. Advised to aim for 150 minutes of exercise per week.   5. Need for influenza vaccination Comments: She was given high dose flu vaccine.  - Flu Vaccine QUAD High Dose(Fluad)  Patient was given opportunity to ask questions. Patient verbalized understanding of the plan and was able to repeat key elements of the plan. All questions were answered to their satisfaction.   I, [name], MD, have reviewed all documentation for this visit. The documentation on 04/17/21 for the exam, diagnosis, procedures, and orders are all accurate and complete.   THE PATIENT IS ENCOURAGED TO PRACTICE SOCIAL DISTANCING DUE TO THE COVID-19 PANDEMIC.

## 2021-04-12 LAB — LIPID PANEL
Chol/HDL Ratio: 3.2 ratio (ref 0.0–4.4)
Cholesterol, Total: 187 mg/dL (ref 100–199)
HDL: 58 mg/dL (ref >39)
LDL Chol Calc (NIH): 102 mg/dL — ABNORMAL HIGH (ref 0–99)
Triglycerides: 154 mg/dL — ABNORMAL HIGH (ref 0–149)
VLDL Cholesterol Cal: 27 mg/dL (ref 5–40)

## 2021-04-12 LAB — URIC ACID: Uric Acid: 7.3 mg/dL (ref 3.1–7.9)

## 2021-04-13 ENCOUNTER — Telehealth: Payer: Medicare Other

## 2021-04-13 ENCOUNTER — Ambulatory Visit (INDEPENDENT_AMBULATORY_CARE_PROVIDER_SITE_OTHER): Payer: Medicare Other

## 2021-04-13 ENCOUNTER — Telehealth: Payer: Self-pay

## 2021-04-13 DIAGNOSIS — E1122 Type 2 diabetes mellitus with diabetic chronic kidney disease: Secondary | ICD-10-CM

## 2021-04-13 DIAGNOSIS — R413 Other amnesia: Secondary | ICD-10-CM

## 2021-04-13 DIAGNOSIS — I129 Hypertensive chronic kidney disease with stage 1 through stage 4 chronic kidney disease, or unspecified chronic kidney disease: Secondary | ICD-10-CM

## 2021-04-13 DIAGNOSIS — N182 Chronic kidney disease, stage 2 (mild): Secondary | ICD-10-CM

## 2021-04-13 DIAGNOSIS — E538 Deficiency of other specified B group vitamins: Secondary | ICD-10-CM

## 2021-04-13 NOTE — Telephone Encounter (Signed)
  Care Management   Follow Up Note   04/13/2021 Name: Pamela Key MRN: [id number] DOB: 01-16-1941   Referred by: [name], MD Reason for referral : Chronic Care Management (Unsuccessful call)   An unsuccessful telephone outreach was attempted today. The patient was referred to the case management team for assistance with care management and care coordination. SW left a HIPAA compliant voice message requesting a return call.  Follow Up Plan: The care management team will reach out to the patient again over the next 21 days.   [name], BSW, CDP Social Worker, Certified Dementia Practitioner [facility] Care Management [phone]

## 2021-04-14 NOTE — Chronic Care Management (AMB) (Signed)
Chronic Care Management   CCM RN Visit Note  04/13/2021 Name: Pamela Key MRN: [id number] DOB: [birth year]  Subjective: Pamela Key is a 80 y.o. year old female who is a primary care patient of Glendale Chard, MD. The care management team was consulted for assistance with disease management and care coordination needs.    Engaged with patient by telephone for follow up visit in response to provider referral for case management and/or care coordination services.   Consent to Services:  The patient was given information about Chronic Care Management services, agreed to services, and gave verbal consent prior to initiation of services.  Please see initial visit note for detailed documentation.   Patient agreed to services and verbal consent obtained.   Assessment: Review of patient past medical history, allergies, medications, health status, including review of consultants reports, laboratory and other test data, was performed as part of comprehensive evaluation and provision of chronic care management services.   SDOH (Social Determinants of Health) assessments and interventions performed:  Yes, see care plan   CCM Care Plan  No Known Allergies  Outpatient Encounter Medications as of 04/13/2021  Medication Sig   allopurinol (ZYLOPRIM) 100 MG tablet TAKE 1 TABLET BY MOUTH EVERY DAY   Blood Glucose Monitoring Suppl (ACCU-CHEK GUIDE) w/Device KIT Use to check blood sugars twice daily E11.65   donepezil (ARICEPT) 10 MG tablet Take half tablet (5 mg) daily for 2 weeks, then increase to the full tablet at 10 mg daily   ferrous sulfate 325 (65 FE) MG tablet Take 325 mg by mouth See admin instructions. Take one tablet (325 mg) by mouth daily Monday thru Friday (skip Saturday and Sunday)   glucose blood (ACCU-CHEK GUIDE) test strip Use to check blood sugars twice daily E11.65   glucose blood test strip Use as instructed to check blood sugars twice daily   Misc Natural Products (BLOOD  SUGAR BALANCE PO) Take 1 capsule by mouth daily.    Multiple Vitamin (MULTIVITAMIN WITH MINERALS) TABS tablet Take 1 tablet by mouth daily with breakfast.   NON FORMULARY daily as needed. CBD capsules Pure organic hemp extract   pravastatin (PRAVACHOL) 40 MG tablet One tab po M-F and skip Saturday/Sundays   valsartan (DIOVAN) 160 MG tablet Take 1 tablet (160 mg total) by mouth daily.   No facility-administered encounter medications on file as of 04/13/2021.    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    Conditions to be addressed/monitored: Type 2 diabetes, Hypertensive nephropathy, Memory deficit, Vitamin B 12 deficiency   Care Plan : RN Care Manager Plan of Care  Updates made by Lynne Logan, RN since 04/13/2021 12:00 AM     Problem: No plan of care established for management of chronic disease states (Type 2 diabetes, Hypertensive nephropathy, Memory deficit, Vitamin B 12 deficiency)   Priority: High     Long-Range Goal: Development of plan of care for chronic disease management (Type 2 diabetes, Hypertensive nephropathy, Memory deficit, Vitamin B 12 deficiency)   Start Date: 04/13/2021  Expected End Date: 04/13/2022  This Visit's Progress: On track  Priority: High  Note:   Current Barriers:  Knowledge Deficits related to plan of care for management of Type 2 diabetes, Hypertensive nephropathy, Memory deficit, Vitamin B 12 deficiency   Chronic Disease Management support and education needs related  to Type 2 diabetes, Hypertensive nephropathy, Memory deficit, Vitamin B 12 deficiency   Cognitive Deficits  RNCM Clinical Goal(s):  Patient will verbalize basic understanding of  Type 2 diabetes, Hypertensive nephropathy, Memory deficit, Vitamin B 12 deficiency  disease process and self health management  plan as evidenced by patient will experience no disease exacerbations related to the disease states listed above take all medications exactly as prescribed and will call provider for medication related questions as evidenced by patient/caregiver will establish a plan of care to improve patient's medication adherence continue to work with RN Care Manager to address care management and care coordination needs related to  Type 2 diabetes, Hypertensive nephropathy, Memory deficit, Vitamin B 12 deficiency  as evidenced by adherence to CM Team Scheduled appointments demonstrate ongoing self health care management ability   as evidenced by    through collaboration with RN Care manager, provider, and care team.   Interventions: 1:1 collaboration with primary care provider regarding development and update of comprehensive plan of care as evidenced by provider attestation and co-signature Inter-disciplinary care team collaboration (see longitudinal plan of care) Evaluation of current treatment plan related to  self management and patient's adherence to plan as established by provider   Diabetes Interventions:  (Status:  Goal on track:  Yes.) Long Term Goal Assessed patient's understanding of A1c goal:  <5.7 Completed successful outbound call with son [facility]  Provided education to patient about basic DM disease process Reviewed medications with patient and discussed importance of medication adherence Counseled on importance of regular laboratory monitoring as prescribed Provided patient with written educational materials related to hypo and hyperglycemia and importance of correct treatment Advised patient, providing education and rationale, to check cbg daily before meals and record, calling PCP and or RN CM for findings outside established parameters Review of patient status, including review of consultants reports, relevant laboratory and other test results, and medications completed Assessed  social determinant of health barriers Determined patient is not self monitoring cbg's secondary to having dementia and forgetting to check sugars Discussed plans with patient for ongoing care management follow up and provided patient with direct contact information for care management team Lab Results  Component Value Date   HGBA1C 6.2 (H) 03/09/2021   Dementia Interventions:  (Status:  Goal on track:  Yes.)  Long Term Goal Evaluation of current treatment plan related to Dementia, self-management and patient's adherence to plan as established by provider Completed successful outbound call with son [facility]  Reviewed and discussed recent Neuro evaluation of Dementia, discussed recommendations  Determined PCP ordered counseling, patient declined services Educated son with rationale basic disease process related to Dementia and what to expect as condition progresses Discussed patient's current living situation and intermittent short term assistance that is available, but his is limited due patient's adult children live out of state Discussed plans for long term plan of care, determined the family has not established a long term plan of care but are considering several options  Discussed importance of ongoing engagement with embedded BSW and PCP for assistance as needed Assessed for home safety concerns, educated son with rationale regarding ways to make patient's home more safe Determined patient is forgetting to take her medications, reviewed medication profile with son providing indication, dosage and frequency of prescribed medications Discussed concerns for missed doses of medications and or accidental overdosing of medications Sent in basket message to embedded Pharm D requesting outreach to son [name] to offer assistance with medication adherence and concerns,  son is aware and will work with embedded Pharm D  Collaboration with embedded BSW [name] regarding home safety  concerns, ongoing level of care concerns and need for ongoing BSW engagement, requested BSW provide education to son regarding process for legal guardianship at next follow up call  Discussed plans with patient for ongoing care management follow up and provided patient with direct contact information for care management team   Hypertension Interventions:  (Status:  Goal on track:  Yes.) Long Term Goal Last practice recorded BP readings:  BP Readings from Last 3 Encounters:  04/11/21 120/70  03/15/21 (!) 211/78  03/09/21 122/60  Most recent eGFR/CrCl:  Lab Results  Component Value Date   EGFR 77 03/09/2021    No components found for: CRCL  Evaluation of current treatment plan related to hypertension self management and patient's adherence to plan as established by provider Provided education to patient re: stroke prevention, s/s of heart attack and stroke Reviewed medications with patient and discussed importance of compliance Provided education on prescribed diet low Sodium Discussed complications of poorly controlled blood pressure such as heart disease, stroke, circulatory complications, vision complications, kidney impairment, sexual dysfunction Assessed social determinant of health barriers Discussed plans with patient for ongoing care management follow up and provided patient with direct contact information for care management team  Patient Goals/Self-Care Activities: Take all medications as prescribed Attend all scheduled provider appointments Call pharmacy for medication refills 3-7 days in advance of running out of medications Call provider office for new concerns or questions  Work with the social worker to address care coordination needs and will continue to work with the clinical team to address health care and disease management related needs drink 6 to 8 glasses of water each day manage portion size take medications for blood pressure exactly as prescribed  Follow Up  Plan:  Telephone follow up appointment with care management team member scheduled for:  06/23/21     Plan:Telephone follow up appointment with care management team member scheduled for:  06/23/21  Barb Merino, RN, BSN, CCM Care Management Coordinator [facility]  Direct Phone: [phone]

## 2021-04-14 NOTE — Patient Instructions (Signed)
Visit Information   Thank you for taking time to visit with me today. Please don't hesitate to contact me if I can be of assistance to you before our next scheduled telephone appointment.  Following are the goals we discussed today:  (Copy and paste patient goals from clinical care plan here)  Our next appointment is by telephone on 06/23/21 at 10:20 AM  Please call the care guide team at [phone] if you need to cancel or reschedule your appointment.   Following is a copy of your full care plan:   Care Plan : [place] of Care  Updates made by Lynne Logan, RN since 04/13/2021 12:00 AM     Problem: No plan of care established for management of chronic disease states (Type 2 diabetes, Hypertensive nephropathy, Memory deficit, Vitamin B 12 deficiency)   Priority: High     Long-Range Goal: Development of plan of care for chronic disease management (Type 2 diabetes, Hypertensive nephropathy, Memory deficit, Vitamin B 12 deficiency)   Start Date: 04/13/2021  Expected End Date: 04/13/2022  This Visit's Progress: On track  Priority: High  Note:   Current Barriers:  Knowledge Deficits related to plan of care for management of Type 2 diabetes, Hypertensive nephropathy, Memory deficit, Vitamin B 12 deficiency   Chronic Disease Management support and education needs related to Type 2 diabetes, Hypertensive nephropathy, Memory deficit, Vitamin B 12 deficiency   Cognitive Deficits  RNCM Clinical Goal(s):  Patient will verbalize basic understanding of  Type 2 diabetes, Hypertensive nephropathy, Memory deficit, Vitamin B 12 deficiency  disease process and self health management plan as evidenced by patient will experience no disease exacerbations related to the disease states listed above take all medications exactly as prescribed and will call provider for medication related questions as evidenced by patient/caregiver will establish a plan of care to improve patient's medication  adherence continue to work with RN Care Manager to address care management and care coordination needs related to  Type 2 diabetes, Hypertensive nephropathy, Memory deficit, Vitamin B 12 deficiency  as evidenced by adherence to CM Team Scheduled appointments demonstrate ongoing self health care management ability   as evidenced by    through collaboration with RN Care manager, provider, and care team.   Interventions: 1:1 collaboration with primary care provider regarding development and update of comprehensive plan of care as evidenced by provider attestation and co-signature Inter-disciplinary care team collaboration (see longitudinal plan of care) Evaluation of current treatment plan related to  self management and patient's adherence to plan as established by provider   Diabetes Interventions:  (Status:  Goal on track:  Yes.) Long Term Goal Assessed patient's understanding of A1c goal:  <5.7 Completed successful outbound call with son [facility]  Provided education to patient about basic DM disease process Reviewed medications with patient and discussed importance of medication adherence Counseled on importance of regular laboratory monitoring as prescribed Provided patient with written educational materials related to hypo and hyperglycemia and importance of correct treatment Advised patient, providing education and rationale, to check cbg daily before meals and record, calling PCP and or RN CM for findings outside established parameters Review of patient status, including review of consultants reports, relevant laboratory and other test results, and medications completed Assessed social determinant of health barriers Determined patient is not self monitoring cbg's secondary to having dementia and forgetting to check sugars Discussed plans with patient for ongoing care management follow up and provided patient with direct contact information for  care management team Lab Results   Component Value Date   HGBA1C 6.2 (H) 03/09/2021   Dementia Interventions:  (Status:  Goal on track:  Yes.)  Long Term Goal Evaluation of current treatment plan related to Dementia, self-management and patient's adherence to plan as established by provider Completed successful outbound call with son [facility]  Reviewed and discussed recent Neuro evaluation of Dementia, discussed recommendations  Determined PCP ordered counseling, patient declined services Educated son with rationale basic disease process related to Dementia and what to expect as condition progresses Discussed patient's current living situation and intermittent short term assistance that is available, but his is limited due patient's adult children live out of state Discussed plans for long term plan of care, determined the family has not established a long term plan of care but are considering several options  Discussed importance of ongoing engagement with embedded BSW and PCP for assistance as needed Assessed for home safety concerns, educated son with rationale regarding ways to make patient's home more safe Determined patient is forgetting to take her medications, reviewed medication profile with son providing indication, dosage and frequency of prescribed medications Discussed concerns for missed doses of medications and or accidental overdosing of medications Sent in basket message to embedded Pharm D requesting outreach to son [name] to offer assistance with medication adherence and concerns, son is aware and will work with embedded Pharm D  Collaboration with embedded BSW [employer] regarding home safety concerns, ongoing level of care concerns and need for ongoing BSW engagement, requested BSW provide education to son regarding process for legal guardianship at next follow up call  Discussed plans with patient for ongoing care management follow up and provided patient with direct contact information for care  management team   Hypertension Interventions:  (Status:  Goal on track:  Yes.) Long Term Goal Last practice recorded BP readings:  BP Readings from Last 3 Encounters:  04/11/21 120/70  03/15/21 (!) 211/78  03/09/21 122/60  Most recent eGFR/CrCl:  Lab Results  Component Value Date   EGFR 77 03/09/2021    No components found for: CRCL  Evaluation of current treatment plan related to hypertension self management and patient's adherence to plan as established by provider Provided education to patient re: stroke prevention, s/s of heart attack and stroke Reviewed medications with patient and discussed importance of compliance Provided education on prescribed diet low Sodium Discussed complications of poorly controlled blood pressure such as heart disease, stroke, circulatory complications, vision complications, kidney impairment, sexual dysfunction Assessed social determinant of health barriers Discussed plans with patient for ongoing care management follow up and provided patient with direct contact information for care management team  Patient Goals/Self-Care Activities: Take all medications as prescribed Attend all scheduled provider appointments Call pharmacy for medication refills 3-7 days in advance of running out of medications Call provider office for new concerns or questions  Work with the social worker to address care coordination needs and will continue to work with the clinical team to address health care and disease management related needs drink 6 to 8 glasses of water each day manage portion size take medications for blood pressure exactly as prescribed  Follow Up Plan:  Telephone follow up appointment with care management team member scheduled for:  06/23/21      Consent to CCM Services: Ms. Omeara was given information about Chronic Care Management services including:  CCM service includes personalized support from designated clinical staff supervised by her  physician, including individualized plan  of care and coordination with other care providers 24/7 contact phone numbers for assistance for urgent and routine care needs. Service will only be billed when office clinical staff spend 20 minutes or more in a month to coordinate care. Only one practitioner may furnish and bill the service in a calendar month. The patient may stop CCM services at any time (effective at the end of the month) by phone call to the office staff. The patient will be responsible for cost sharing (co-pay) of up to 20% of the service fee (after annual deductible is met).  Patient agreed to services and verbal consent obtained.   Patient verbalizes understanding of instructions provided today and agrees to view in [place].   Telephone follow up appointment with care management team member scheduled for: 06/23/21

## 2021-04-24 ENCOUNTER — Ambulatory Visit: Payer: Medicare Other

## 2021-04-24 DIAGNOSIS — E1122 Type 2 diabetes mellitus with diabetic chronic kidney disease: Secondary | ICD-10-CM

## 2021-04-24 DIAGNOSIS — I129 Hypertensive chronic kidney disease with stage 1 through stage 4 chronic kidney disease, or unspecified chronic kidney disease: Secondary | ICD-10-CM

## 2021-04-24 NOTE — Patient Instructions (Signed)
Social Worker Visit Information  Goals we discussed today:   Goals Addressed             This Visit's Progress    Home and Family Safety Maintained   On track    Timeframe:  Long-Range Goal Priority:  High Start Date:  5.19.22                                          Next planned outreach: 1.12.23  Patient Goals/Self-Care Activities patient will: with the help of her family -Review interest in participating in a therapeutic relationship to address new dementia diagnosis -Contact SW as needed prior to next scheduled call         Materials Provided: Verbal education about resources provided by phone  Patient verbalizes understanding of instructions provided today and agrees to view in [place].   Follow Up Plan: SW will follow up with patient by phone over the next month   [name], BSW, CDP Social Worker, Certified Dementia Practitioner [facility] Care Management [phone]

## 2021-04-24 NOTE — Chronic Care Management (AMB) (Signed)
Chronic Care Management    Social Work Note  04/24/2021 Name: Pamela Key MRN: [id number] DOB: July 10, 1940  Pamela Key is a 80 y.o. year old female who is a primary care patient of Glendale Chard, MD. The CCM team was consulted to assist the patient with chronic disease management and/or care coordination needs related to:  DM II, Hypertensive Nephropathy, Mild Late onset Alzheimer's Dementia .   Engaged with patients son by phone  for follow up visit in response to provider referral for social work chronic care management and care coordination services.   Consent to Services:  The patient was given information about Chronic Care Management services, agreed to services, and gave verbal consent prior to initiation of services.  Please see initial visit note for detailed documentation.   Patient agreed to services and consent obtained.   Assessment: Review of patient past medical history, allergies, medications, and health status, including review of relevant consultants reports was performed today as part of a comprehensive evaluation and provision of chronic care management and care coordination services.     SDOH (Social Determinants of Health) assessments and interventions performed:    Advanced Directives Status: Not addressed in this encounter.  CCM Care Plan  No Known Allergies  Outpatient Encounter Medications as of 04/24/2021  Medication Sig   allopurinol (ZYLOPRIM) 100 MG tablet TAKE 1 TABLET BY MOUTH EVERY DAY   Blood Glucose Monitoring Suppl (ACCU-CHEK GUIDE) w/Device KIT Use to check blood sugars twice daily E11.65   donepezil (ARICEPT) 10 MG tablet Take half tablet (5 mg) daily for 2 weeks, then increase to the full tablet at 10 mg daily   ferrous sulfate 325 (65 FE) MG tablet Take 325 mg by mouth See admin instructions. Take one tablet (325 mg) by mouth daily Monday thru Friday (skip Saturday and Sunday)   glucose blood (ACCU-CHEK GUIDE) test strip Use to  check blood sugars twice daily E11.65   glucose blood test strip Use as instructed to check blood sugars twice daily   Misc Natural Products (BLOOD SUGAR BALANCE PO) Take 1 capsule by mouth daily.    Multiple Vitamin (MULTIVITAMIN WITH MINERALS) TABS tablet Take 1 tablet by mouth daily with breakfast.   NON FORMULARY daily as needed. CBD capsules Pure organic hemp extract   pravastatin (PRAVACHOL) 40 MG tablet One tab po M-F and skip Saturday/Sundays   valsartan (DIOVAN) 160 MG tablet Take 1 tablet (160 mg total) by mouth daily.   No facility-administered encounter medications on file as of 04/24/2021.    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    Conditions to be addressed/monitored:  DM II, Hypertensive Nephropathy, Mild late onset Alzheimer's Dementia  Care Plan : Social Work [place]  Updates made by [name] since 04/24/2021 12:00 AM     Problem: Safety - family concerned pt has dementia      Long-Range Goal: Home and Family Safety Maintained   Start Date: 09/29/2020  This Visit's Progress: On track  Recent Progress: On track  Priority: High  Note:   Current Barriers:  Chronic disease management support and education needs related to HTN and DM  Limited support system Cognitive Decline  Social Worker Clinical Goal(s):  Over the next 90 days the patient and her daughter will work with SW to identify resources to assist  with patient care needs Over the next 10 days the patient and her children will follow up on status of Medicaid application as directed by SW -Goal Met Over the next 30 days the patient will schedule an appointment with neurology -Goal Met SW Interventions:  Inter-disciplinary care team collaboration (see longitudinal plan of care) Collaboration with Glendale Chard,  MD regarding development and update of comprehensive plan of care as evidenced by provider attestation and co-signature Second unsuccessful outbound call placed to the patient to assess for interest in counseling services, voice message left requesting a return call Successful outbound call placed to the patients son [place] to advise SW is unable to get in touch with the patient to explore her interest in participating with a counselor regarding recent diagnosis of dementia Discussed Mr. [place] would like to discuss this referral with his mom to determine if she is agreeable to attending an appointment Advised Mr. [place] that if the patient is agreeable he may assist her in calling [facility] to set up an appointment Mr. [place] requests SW assistance with obtaining meals on wheels for the patient - advised SW placed the patient on the wait list for this program in August 2022 and the wait list is normally 6 months long Scheduled follow up call over the next month  Patient Goals/Self-Care Activities patient will: with the help of her family -Review interest in participating in a therapeutic relationship to address new dementia diagnosis -Contact SW as needed prior to next scheduled call  Follow Up Plan:  SW will follow up with the patient on 1.12.23       Follow Up Plan: SW will follow up with patient by phone over the next month.      [name], BSW, CDP Social Worker, Certified Dementia Practitioner [place] / [place] [facility] [phone]

## 2021-04-27 ENCOUNTER — Other Ambulatory Visit: Payer: Self-pay

## 2021-04-27 MED ORDER — PRAVASTATIN SODIUM 40 MG PO TABS: ORAL_TABLET | ORAL | 1 refills | Status: DC

## 2021-05-13 DIAGNOSIS — I129 Hypertensive chronic kidney disease with stage 1 through stage 4 chronic kidney disease, or unspecified chronic kidney disease: Secondary | ICD-10-CM

## 2021-05-13 DIAGNOSIS — E1122 Type 2 diabetes mellitus with diabetic chronic kidney disease: Secondary | ICD-10-CM | POA: Diagnosis not present

## 2021-05-13 DIAGNOSIS — N182 Chronic kidney disease, stage 2 (mild): Secondary | ICD-10-CM

## 2021-05-15 ENCOUNTER — Encounter: Payer: Self-pay | Admitting: Internal Medicine

## 2021-05-16 ENCOUNTER — Ambulatory Visit: Payer: Medicare Other

## 2021-05-25 ENCOUNTER — Ambulatory Visit (INDEPENDENT_AMBULATORY_CARE_PROVIDER_SITE_OTHER): Payer: Medicare Other

## 2021-05-25 DIAGNOSIS — E1122 Type 2 diabetes mellitus with diabetic chronic kidney disease: Secondary | ICD-10-CM

## 2021-05-25 DIAGNOSIS — R413 Other amnesia: Secondary | ICD-10-CM

## 2021-05-25 DIAGNOSIS — I129 Hypertensive chronic kidney disease with stage 1 through stage 4 chronic kidney disease, or unspecified chronic kidney disease: Secondary | ICD-10-CM

## 2021-05-25 DIAGNOSIS — N182 Chronic kidney disease, stage 2 (mild): Secondary | ICD-10-CM

## 2021-05-26 NOTE — Patient Instructions (Addendum)
Social Worker Visit Information  Goals we discussed today:   Goals Addressed             This Visit's Progress    Home and Family Safety Maintained   On track    Timeframe:  Long-Range Goal Priority:  High Start Date:  5.19.22                                          Next planned outreach: 3.9.23  Patient Goals/Self-Care Activities patient will: with the help of her family -Engage with [employer] as needed [employer] SW as needed prior to next scheduled call         Materials Provided: Verbal education about mobile meals program provided by phone  Patient verbalizes understanding of instructions and care plan provided today and agrees to view in [place]. Active MyChart status confirmed with patient.    Follow Up Plan: SW will follow up with patient by phone over the next 60 days  [name], BSW, CDP Social Worker, Certified Dementia Practitioner [facility] Care Management [phone]

## 2021-05-26 NOTE — Chronic Care Management (AMB) (Signed)
Chronic Care Management    Social Work Note  05/26/2021 Name: Pamela Key MRN: [id number] DOB: 08-Oct-1940  Pamela Key is a 81 y.o. year old female who is a primary care patient of Glendale Chard, MD. The CCM team was consulted to assist the patient with chronic disease management and/or care coordination needs related to:  DM II, Hypertensive Nephropathy, Mild Late Onset Alzheimer's Dementia .   Engaged with patient by telephone for follow up visit in response to provider referral for social work chronic care management and care coordination services.   Consent to Services:  The patient was given information about Chronic Care Management services, agreed to services, and gave verbal consent prior to initiation of services.  Please see initial visit note for detailed documentation.   Patient agreed to services and consent obtained.   Assessment: Review of patient past medical history, allergies, medications, and health status, including review of relevant consultants reports was performed today as part of a comprehensive evaluation and provision of chronic care management and care coordination services.     SDOH (Social Determinants of Health) assessments and interventions performed:    Advanced Directives Status: Not addressed in this encounter.  CCM Care Plan  No Known Allergies  Outpatient Encounter Medications as of 05/25/2021  Medication Sig   allopurinol (ZYLOPRIM) 100 MG tablet TAKE 1 TABLET BY MOUTH EVERY DAY   Blood Glucose Monitoring Suppl (ACCU-CHEK GUIDE) w/Device KIT Use to check blood sugars twice daily E11.65   donepezil (ARICEPT) 10 MG tablet Take half tablet (5 mg) daily for 2 weeks, then increase to the full tablet at 10 mg daily   ferrous sulfate 325 (65 FE) MG tablet Take 325 mg by mouth See admin instructions. Take one tablet (325 mg) by mouth daily Monday thru Friday (skip Saturday and Sunday)   glucose blood (ACCU-CHEK GUIDE) test strip Use to check  blood sugars twice daily E11.65   glucose blood test strip Use as instructed to check blood sugars twice daily   Misc Natural Products (BLOOD SUGAR BALANCE PO) Take 1 capsule by mouth daily.    Multiple Vitamin (MULTIVITAMIN WITH MINERALS) TABS tablet Take 1 tablet by mouth daily with breakfast.   NON FORMULARY daily as needed. CBD capsules Pure organic hemp extract   pravastatin (PRAVACHOL) 40 MG tablet One tab po M-F and skip Saturday/Sundays   valsartan (DIOVAN) 160 MG tablet Take 1 tablet (160 mg total) by mouth daily.   No facility-administered encounter medications on file as of 05/25/2021.    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    Conditions to be addressed/monitored:  DM II, Hypertensive Nephropathy, Mild Late Onset Alzheimer's Dementia ; Limited access to food  Care Plan : Social Work [place]  Updates made by [name] since 05/26/2021 12:00 AM     Problem: Safety - family concerned pt has dementia      Long-Range Goal: Home and Family Safety Maintained   Start Date: 09/29/2020  This Visit's Progress: On track  Recent Progress: On track  Priority: High  Note:   Current Barriers:  Chronic disease management support and education needs related to HTN and DM  Limited support system Cognitive Decline  Social Worker Clinical Goal(s):  Over the next 90 days the patient and her daughter will work with SW to identify  resources to assist with patient care needs Over the next 10 days the patient and her children will follow up on status of Medicaid application as directed by SW -Goal Met Over the next 30 days the patient will schedule an appointment with neurology -Goal Met SW Interventions:  Inter-disciplinary care team collaboration (see longitudinal plan of care) Collaboration  with Glendale Chard, MD regarding development and update of comprehensive plan of care as evidenced by provider attestation and co-signature Second unsuccessful outbound call placed to the patient to assess for interest in counseling services, voice message left requesting a return call Successful outbound call placed to the patient to assist with care coordination needs Discussed the patient is not interested in interacting with counseling at this time Reviewed patient has been placed on the wait list to receive mobile meals; advised the patient her son is the point of contact and once meals are available she will receive meals daily around lunch Determined patient has been thinking of signing up for group exercises at her local [employer] - patient reports she will utilize [employer] transportation and attend senior exercise classes Encouraged the patient to stay active and contact SW as needed  Patient Goals/Self-Care Activities patient will: with the help of her family -Engage with [employer] as needed [employer] SW as needed prior to next scheduled call  Follow Up Plan:  SW will follow up with the patient on 3.9.23       Follow Up Plan: SW will follow up with patient by phone over the next 60 days.      [name], BSW, CDP Social Worker, Certified Dementia Practitioner [place] [facility] [place] [facility] [phone]

## 2021-06-02 ENCOUNTER — Other Ambulatory Visit: Payer: Self-pay

## 2021-06-02 ENCOUNTER — Ambulatory Visit: Payer: Medicare Other

## 2021-06-02 DIAGNOSIS — M2040 Other hammer toe(s) (acquired), unspecified foot: Secondary | ICD-10-CM

## 2021-06-02 DIAGNOSIS — E1165 Type 2 diabetes mellitus with hyperglycemia: Secondary | ICD-10-CM

## 2021-06-02 DIAGNOSIS — M21611 Bunion of right foot: Secondary | ICD-10-CM

## 2021-06-03 NOTE — Progress Notes (Signed)
SITUATION Reason for Consult: Evaluation for Prefabricated Diabetic Shoes and Bilateral Custom Diabetic Inserts. Patient / Caregiver Report: Patient would like well fitting shoes  OBJECTIVE DATA: Patient History / Diagnosis:    ICD-10-CM   1. Uncontrolled type 2 diabetes mellitus with hyperglycemia (HCC)  E11.65     2. Bunion of great toe of right foot  M21.611     3. Hammer toe, unspecified laterality  M20.40       Current or Previous Devices:   None and no history  In-Person Foot Examination: Ulcers & Callousing:   None  Toe / Foot Deformities:   - Hammertoes - Hallux Vagus  Shoe Size: 10XW  ORTHOTIC RECOMMENDATION Recommended Devices: - 1x pair prefabricated PDAC approved diabetic shoes: X801W - 3x pair custom-to-patient vacuum formed diabetic insoles.   GOALS OF SHOES AND INSOLES - Reduce shear and pressure - Reduce / Prevent callus formation - Reduce / Prevent ulceration - Protect the fragile healing compromised diabetic foot.  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 Procedure was explained and patient tolerated procedure well. All questions were answered and concerns addressed.  PLAN Patient is to ensure treating physician receives and completes diabetic paperwork. Casts and shoe order are to be held until paperwork is received. Once received patient is to be scheduled for fitting in four weeks.

## 2021-06-09 ENCOUNTER — Other Ambulatory Visit: Payer: Medicare Other

## 2021-06-13 DIAGNOSIS — N182 Chronic kidney disease, stage 2 (mild): Secondary | ICD-10-CM

## 2021-06-13 DIAGNOSIS — E1122 Type 2 diabetes mellitus with diabetic chronic kidney disease: Secondary | ICD-10-CM

## 2021-06-13 DIAGNOSIS — I129 Hypertensive chronic kidney disease with stage 1 through stage 4 chronic kidney disease, or unspecified chronic kidney disease: Secondary | ICD-10-CM

## 2021-06-21 ENCOUNTER — Ambulatory Visit (INDEPENDENT_AMBULATORY_CARE_PROVIDER_SITE_OTHER): Payer: BC Managed Care – PPO

## 2021-06-21 ENCOUNTER — Other Ambulatory Visit: Payer: Self-pay

## 2021-06-21 ENCOUNTER — Ambulatory Visit (INDEPENDENT_AMBULATORY_CARE_PROVIDER_SITE_OTHER): Payer: BC Managed Care – PPO | Admitting: Internal Medicine

## 2021-06-21 ENCOUNTER — Encounter: Payer: Self-pay | Admitting: Internal Medicine

## 2021-06-21 VITALS — BP 180/80 | HR 66 | Temp 98.0°F | Ht 62.2 in | Wt 185.0 lb

## 2021-06-21 VITALS — BP 166/60 | HR 66 | Temp 98.0°F | Ht 62.2 in | Wt 185.0 lb

## 2021-06-21 DIAGNOSIS — Z0000 Encounter for general adult medical examination without abnormal findings: Secondary | ICD-10-CM | POA: Diagnosis not present

## 2021-06-21 DIAGNOSIS — I129 Hypertensive chronic kidney disease with stage 1 through stage 4 chronic kidney disease, or unspecified chronic kidney disease: Secondary | ICD-10-CM | POA: Diagnosis not present

## 2021-06-21 DIAGNOSIS — E6609 Other obesity due to excess calories: Secondary | ICD-10-CM

## 2021-06-21 DIAGNOSIS — M1A3720 Chronic gout due to renal impairment, left ankle and foot, without tophus (tophi): Secondary | ICD-10-CM

## 2021-06-21 DIAGNOSIS — Z23 Encounter for immunization: Secondary | ICD-10-CM | POA: Diagnosis not present

## 2021-06-21 DIAGNOSIS — E1122 Type 2 diabetes mellitus with diabetic chronic kidney disease: Secondary | ICD-10-CM

## 2021-06-21 DIAGNOSIS — Z6833 Body mass index (BMI) 33.0-33.9, adult: Secondary | ICD-10-CM

## 2021-06-21 DIAGNOSIS — N182 Chronic kidney disease, stage 2 (mild): Secondary | ICD-10-CM | POA: Diagnosis not present

## 2021-06-21 MED ORDER — VALSARTAN 160 MG PO TABS: 160.0000 mg | ORAL_TABLET | Freq: Every day | ORAL | 1 refills | Status: DC

## 2021-06-21 NOTE — Progress Notes (Signed)
This visit occurred during the SARS-CoV-2 public health emergency.  Safety protocols were in place, including screening questions prior to the visit, additional usage of staff PPE, and extensive cleaning of exam room while observing appropriate contact time as indicated for disinfecting solutions.  Subjective:   Pamela Key is a 81 y.o. female who presents for Medicare Annual (Subsequent) preventive examination.  Review of Systems     Cardiac Risk Factors include: advanced age (>62mn, >>45women);diabetes mellitus;hypertension     Objective:    Today's Vitals   06/21/21 1022 06/21/21 1056  BP: (!) 180/80 (!) 166/60  Pulse: 66   Temp: 98 F (36.7 C)   TempSrc: Oral   SpO2: 97%   Weight: 185 lb (83.9 kg)   Height: 5' 2.2" (1.58 m)    Body mass index is 33.62 kg/m.  Advanced Directives 06/21/2021 03/15/2021 12/24/2020 11/11/2020 06/09/2020 01/23/2020 12/31/2019  Does Patient Have a Medical Advance Directive? No No No Yes No No No  Type of Advance Directive - - - [place] Would patient like information on creating a medical advance directive? No - Patient declined - No - Patient declined - No - Patient declined Yes (ED - Information included in AVS) -    Current Medications (verified) Outpatient Encounter Medications as of 06/21/2021  Medication Sig   allopurinol (ZYLOPRIM) 100 MG tablet TAKE 1 TABLET BY MOUTH EVERY DAY   Blood Glucose Monitoring Suppl (ACCU-CHEK GUIDE) w/Device KIT Use to check blood sugars twice daily E11.65   donepezil (ARICEPT) 10 MG tablet Take half tablet (5 mg) daily for 2 weeks, then increase to the full tablet at 10 mg daily   ferrous sulfate 325 (65 FE) MG tablet Take 325 mg by mouth See admin instructions. Take one tablet (325 mg) by mouth daily Monday thru Friday (skip Saturday and Sunday)   glucose blood (ACCU-CHEK GUIDE) test strip Use to check blood sugars twice daily E11.65   glucose blood test strip Use as instructed to  check blood sugars twice daily   Misc Natural Products (BLOOD SUGAR BALANCE PO) Take 1 capsule by mouth daily.    Multiple Vitamin (MULTIVITAMIN WITH MINERALS) TABS tablet Take 1 tablet by mouth daily with breakfast.   NON FORMULARY daily as needed. CBD capsules Pure organic hemp extract   pravastatin (PRAVACHOL) 40 MG tablet One tab po M-F and skip Saturday/Sundays   [DISCONTINUED] valsartan (DIOVAN) 160 MG tablet Take 1 tablet (160 mg total) by mouth daily.   No facility-administered encounter medications on file as of 06/21/2021.    Allergies (verified) Patient has no known allergies.   History: Past Medical History:  Diagnosis Date   Diabetes mellitus without complication (HCC)    Gout    High cholesterol    Hypertension    Past Surgical History:  Procedure Laterality Date   TUBAL LIGATION  1976   Family History  Problem Relation Age of Onset   Diabetes Mother    Hypertension Mother    Cancer Father    Hypertension Father    Social History   Socioeconomic History   Marital status: Divorced    Spouse name: Not on file   Number of children: Not on file   Years of education: Not on file   Highest education level: Not on file  Occupational History   Occupation: retired  Tobacco Use   Smoking status: Former    Packs/day: 0.75    Years: 15.00  Pack years: 11.25    Types: Cigarettes    Start date: 56    Quit date: 1976    Years since quitting: 47.1    Passive exposure: Past   Smokeless tobacco: Never   Tobacco comments:    smoked at least 10 years, can't remember exact years  Vaping Use   Vaping Use: Never used  Substance and Sexual Activity   Alcohol use: Not Currently    Comment: on occassion wine   Drug use: No   Sexual activity: Not Currently  Other Topics Concern   Not on file  Social History Narrative   Right handed    Social Determinants of Health   Financial Resource Strain: Low Risk    Difficulty of Paying Living Expenses: Not hard at all   Food Insecurity: No Food Insecurity   Worried About Charity fundraiser in the Last Year: Never true   Ran Out of Food in the Last Year: Never true  Transportation Needs: No Transportation Needs   Lack of Transportation (Medical): No   Lack of Transportation (Non-Medical): No  Physical Activity: Insufficiently Active   Days of Exercise per Week: 5 days   Minutes of Exercise per Session: 10 min  Stress: No Stress Concern Present   Feeling of Stress : Not at all  Social Connections: Not on file    Tobacco Counseling Counseling given: Not Answered Tobacco comments: smoked at least 10 years, can't remember exact years   Clinical Intake:  Pre-visit preparation completed: Yes  Pain : No/denies pain     Nutritional Status: BMI > 30  Obese Nutritional Risks: None Diabetes: Yes  How often do you need to have someone help you when you read instructions, pamphlets, or other written materials from your doctor or pharmacy?: 1 - Never What is the last grade level you completed in school?: masters degree  Diabetic? Yes Nutrition Risk Assessment:  Has the patient had any N/V/D within the last 2 months?  No  Does the patient have any non-healing wounds?  No  Has the patient had any unintentional weight loss or weight gain?  No   Diabetes:  Is the patient diabetic?  Yes  If diabetic, was a CBG obtained today?  No  Did the patient bring in their glucometer from home?  No  How often do you monitor your CBG's? Does not.   Financial Strains and Diabetes Management:  Are you having any financial strains with the device, your supplies or your medication? No .  Does the patient want to be seen by Chronic Care Management for management of their diabetes?  No  Would the patient like to be referred to a Nutritionist or for Diabetic Management?  No   Diabetic Exams:  Diabetic Eye Exam: Overdue for diabetic eye exam. Pt has been advised about the importance in completing this exam.  Patient advised to call and schedule an eye exam. Diabetic Foot Exam: Overdue, Pt has been advised about the importance in completing this exam. Pt is scheduled for diabetic foot exam on next appointment.   Interpreter Needed?: No  Information entered by :: NAllen LPN   Activities of Daily Living In your present state of health, do you have any difficulty performing the following activities: 06/21/2021  Hearing? N  Vision? N  Difficulty concentrating or making decisions? N  Walking or climbing stairs? N  Dressing or bathing? N  Doing errands, shopping? N  Preparing Food and eating ? N  Using the  Toilet? N  In the past six months, have you accidently leaked urine? N  Do you have problems with loss of bowel control? N  Managing your Medications? N  Managing your Finances? N  Housekeeping or managing your Housekeeping? N  Some recent data might be hidden    Patient Care Team: Glendale Chard, MD as PCP - General (Internal Medicine) Rex Kras, Claudette Stapler, RN as Case Manager [name] as [facility] Shawn Route, Nancy Marus (Neurology)  Indicate any recent Medical Services you may have received from other than [facility] providers in the past year (date may be approximate).     Assessment:   This is a routine wellness examination for [facility].  Hearing/Vision screen Vision Screening - Comments:: Regular eye exams, [facility]  Dietary issues and exercise activities discussed: Current Exercise Habits: Home exercise routine, Type of exercise: walking, Time (Minutes): 10, Frequency (Times/Week): 5, Weekly Exercise (Minutes/Week): 50   Goals Addressed             This Visit's Progress    Patient Stated       06/21/2021, maintain current health or get better if possible.       Depression Screen PHQ 2/9 Scores 06/21/2021 06/09/2020 12/31/2019 03/19/2019 02/11/2019 12/31/2018 03/13/2018  PHQ - 2 Score 0 0 0 0 0 0 0  PHQ- 9 Score - - - 0 - - -    Fall  Risk Fall Risk  06/21/2021 03/15/2021 11/11/2020 06/09/2020 12/31/2019  Falls in the past year? 0 0 0 0 0  Number falls in past yr: - 0 0 - -  Injury with Fall? - 0 0 - -  Risk for fall due to : Medication side effect - - Medication side effect Medication side effect  Follow up Falls evaluation completed;Education provided;Falls prevention discussed - - Falls evaluation completed;Education provided;Falls prevention discussed Falls evaluation completed;Education provided;Falls prevention discussed    FALL RISK PREVENTION PERTAINING TO THE HOME:  Any stairs in or around the home? Yes  If so, are there any without handrails? No  Home free of loose throw rugs in walkways, pet beds, electrical cords, etc? Yes  Adequate lighting in your home to reduce risk of falls? Yes   ASSISTIVE DEVICES UTILIZED TO PREVENT FALLS:  Life alert? No  Use of a cane, walker or w/c? Yes  Grab bars in the bathroom? Yes  Shower chair or bench in shower? Yes  Elevated toilet seat or a handicapped toilet? No   TIMED UP AND GO:  Was the test performed? No .    Gait slow and steady with assistive device  Cognitive Function:   Montreal Cognitive Assessment  11/11/2020  Visuospatial/ Executive (0/5) 3  Naming (0/3) 1  Attention: Read list of digits (0/2) 2  Attention: Read list of letters (0/1) 1  Attention: Serial 7 subtraction starting at 100 (0/3) 3  Language: Repeat phrase (0/2) 1  Language : Fluency (0/1) 1  Abstraction (0/2) 1  Delayed Recall (0/5) 1  Orientation (0/6) 5  Total 19  Adjusted Score (based on education) 19   6CIT Screen 06/21/2021 06/09/2020 12/31/2019 06/17/2019 03/19/2019  What Year? 0 points 0 points 0 points 0 points 0 points  What month? 0 points 3 points 0 points 0 points 0 points  What time? 3 points 0 points 0 points - 0 points  Count back from 20 0 points 0 points 0 points 0 points 0 points  Months in reverse 0 points 0  points 0 points 0 points 0 points  Repeat phrase 8 points 10  points 10 points 8 points 6 points  Total Score _0 - 6    Immunizations Immunization History  Administered Date(s) Administered   Fluad Quad(high Dose 65+) 04/05/2020, 04/11/2021   Influenza, High Dose Seasonal PF 12/31/2018   Moderna SARS-COV2 Booster Vaccination 06/30/2020   Moderna Sars-Covid-2 Vaccination 09/18/2019, 10/12/2019   PNEUMOCOCCAL CONJUGATE-20 06/21/2021   Pneumococcal Polysaccharide-23 05/29/2010    TDAP status: Up to date  Flu Vaccine status: Up to date  Pneumococcal vaccine status: Completed during today's visit.  Covid-19 vaccine status: Completed vaccines  Qualifies for Shingles Vaccine? Yes   Zostavax completed No   Shingrix Completed?: No.    Education has been provided regarding the importance of this vaccine. Patient has been advised to call insurance company to determine out of pocket expense if they have not yet received this vaccine. Advised may also receive vaccine at local pharmacy or Health Dept. Verbalized acceptance and understanding.  Screening Tests Health Maintenance  Topic Date Due   Zoster Vaccines- Shingrix (1 of 2) Never done   COVID-19 Vaccine (3 - Moderna risk series) 07/28/2020   OPHTHALMOLOGY EXAM  12/09/2020   FOOT EXAM  03/23/2021   HEMOGLOBIN A1C  09/07/2021   TETANUS/TDAP  02/10/2023   Pneumonia Vaccine 46+ Years old  Completed   INFLUENZA VACCINE  Completed   DEXA SCAN  Completed   Hepatitis C Screening  Completed   HPV VACCINES  Aged Out   COLONOSCOPY (Pts 45-53yr Insurance coverage will need to be confirmed)  [place]aintenance Due  Topic Date Due   Zoster Vaccines- Shingrix (1 of 2) Never done   COVID-19 Vaccine (3 - Moderna risk series) 07/28/2020   OPHTHALMOLOGY EXAM  12/09/2020   FOOT EXAM  03/23/2021    Colorectal cancer screening: No longer required.   Mammogram status: No longer required due to age.  Bone Density status: Completed 03/13/2017.   Lung Cancer  Screening: (Low Dose CT Chest recommended if Age 79-80years, 30 pack-year currently smoking OR have quit w/in 15years.) does not qualify.   Lung Cancer Screening Referral: no  Additional Screening:  Hepatitis C Screening: does qualify; Completed 10/26/2019  Vision Screening: Recommended annual ophthalmology exams for early detection of glaucoma and other disorders of the eye. Is the patient up to date with their annual eye exam?  Yes  Who is the provider or what is the name of the office in which the patient attends annual eye exams? [facility] If pt is not established with a provider, would they like to be referred to a provider to establish care? No .   Dental Screening: Recommended annual dental exams for proper oral hygiene  Community Resource Referral / Chronic Care Management: CRR required this visit?  No   CCM required this visit?  No      Plan:     I have personally reviewed and noted the following in the patients chart:   Medical and social history Use of alcohol, tobacco or illicit drugs  Current medications and supplements including opioid prescriptions.  Functional ability and status Nutritional status Physical activity Advanced directives List of other physicians Hospitalizations, surgeries, and ER visits in previous 12 months Vitals Screenings to include cognitive, depression, and falls Referrals and appointments  In addition, I have reviewed and discussed with patient certain preventive protocols, quality metrics, and best practice recommendations. A written  personalized care plan for preventive services as well as general preventive health recommendations were provided to patient.     Kellie Simmering, LPN   12/17/4845   Nurse Notes: none

## 2021-06-21 NOTE — Patient Instructions (Signed)
Pamela Key , Thank you for taking time to come for your Medicare Wellness Visit. I appreciate your ongoing commitment to your health goals. Please review the following plan we discussed and let me know if I can assist you in the future.   Screening recommendations/referrals: Colonoscopy: not required Mammogram: not required Bone Density: completed 03/13/2017 Recommended yearly ophthalmology/optometry visit for glaucoma screening and checkup Recommended yearly dental visit for hygiene and checkup  Vaccinations: Influenza vaccine: completed 04/11/2021, due next flu season Pneumococcal vaccine:  today Tdap vaccine:  completed 02/09/2013, due 02/10/2023 Shingles vaccine: declined   Covid-19: 06/30/2020, 10/12/2019, 09/18/2019  Advanced directives: Advance directive discussed with you today. Even though you declined this today please call our office should you change your mind and we can give you the proper paperwork for you to fill out.  Conditions/risks identified: none  Next appointment: Follow up in one year for your annual wellness visit    Preventive Care 65 Years and Older, Female Preventive care refers to lifestyle choices and visits with your health care provider that can promote health and wellness. What does preventive care include? A yearly physical exam. This is also called an annual well check. Dental exams once or twice a year. Routine eye exams. Ask your health care provider how often you should have your eyes checked. Personal lifestyle choices, including: Daily care of your teeth and gums. Regular physical activity. Eating a healthy diet. Avoiding tobacco and drug use. Limiting alcohol use. Practicing safe sex. Taking low-dose aspirin every day. Taking vitamin and mineral supplements as recommended by your health care provider. What happens during an annual well check? The services and screenings done by your health care provider during your annual well check will  depend on your age, overall health, lifestyle risk factors, and family history of disease. Counseling  Your health care provider may ask you questions about your: Alcohol use. Tobacco use. Drug use. Emotional well-being. Home and relationship well-being. Sexual activity. Eating habits. History of falls. Memory and ability to understand (cognition). Work and work Astronomer. Reproductive health. Screening  You may have the following tests or measurements: Height, weight, and BMI. Blood pressure. Lipid and cholesterol levels. These may be checked every 5 years, or more frequently if you are over 43 years old. Skin check. Lung cancer screening. You may have this screening every year starting at age 47 if you have a 30-pack-year history of smoking and currently smoke or have quit within the past 15 years. Fecal occult blood test (FOBT) of the stool. You may have this test every year starting at age 46. Flexible sigmoidoscopy or colonoscopy. You may have a sigmoidoscopy every 5 years or a colonoscopy every 10 years starting at age 31. Hepatitis C blood test. Hepatitis B blood test. Sexually transmitted disease (STD) testing. Diabetes screening. This is done by checking your blood sugar (glucose) after you have not eaten for a while (fasting). You may have this done every 1-3 years. Bone density scan. This is done to screen for osteoporosis. You may have this done starting at age 66. Mammogram. This may be done every 1-2 years. Talk to your health care provider about how often you should have regular mammograms. Talk with your health care provider about your test results, treatment options, and if necessary, the need for more tests. Vaccines  Your health care provider may recommend certain vaccines, such as: Influenza vaccine. This is recommended every year. Tetanus, diphtheria, and acellular pertussis (Tdap, Td) vaccine. You may need a  Td booster every 10 years. Zoster vaccine. You may  need this after age 74. Pneumococcal 13-valent conjugate (PCV13) vaccine. One dose is recommended after age 33. Pneumococcal polysaccharide (PPSV23) vaccine. One dose is recommended after age 60. Talk to your health care provider about which screenings and vaccines you need and how often you need them. This information is not intended to replace advice given to you by your health care provider. Make sure you discuss any questions you have with your health care provider. Document Released: 05/27/2015 Document Revised: 01/18/2016 Document Reviewed: 03/01/2015 Elsevier Interactive Patient Education  2017 [employer].  Fall Prevention in the Home Falls can cause injuries. They can happen to people of all ages. There are many things you can do to make your home safe and to help prevent falls. What can I do on the outside of my home? Regularly fix the edges of walkways and driveways and fix any cracks. Remove anything that might make you trip as you walk through a door, such as a raised step or threshold. Trim any bushes or trees on the path to your home. Use bright outdoor lighting. Clear any walking paths of anything that might make someone trip, such as rocks or tools. Regularly check to see if handrails are loose or broken. Make sure that both sides of any steps have handrails. Any raised decks and porches should have guardrails on the edges. Have any leaves, snow, or ice cleared regularly. Use sand or salt on walking paths during winter. Clean up any spills in your garage right away. This includes oil or grease spills. What can I do in the bathroom? Use night lights. Install grab bars by the toilet and in the tub and shower. Do not use towel bars as grab bars. Use non-skid mats or decals in the tub or shower. If you need to sit down in the shower, use a plastic, non-slip stool. Keep the floor dry. Clean up any water that spills on the floor as soon as it happens. Remove soap buildup in  the tub or shower regularly. Attach bath mats securely with double-sided non-slip rug tape. Do not have throw rugs and other things on the floor that can make you trip. What can I do in the bedroom? Use night lights. Make sure that you have a light by your bed that is easy to reach. Do not use any sheets or blankets that are too big for your bed. They should not hang down onto the floor. Have a firm chair that has side arms. You can use this for support while you get dressed. Do not have throw rugs and other things on the floor that can make you trip. What can I do in the kitchen? Clean up any spills right away. Avoid walking on wet floors. Keep items that you use a lot in easy-to-reach places. If you need to reach something above you, use a strong step stool that has a grab bar. Keep electrical cords out of the way. Do not use floor polish or wax that makes floors slippery. If you must use wax, use non-skid floor wax. Do not have throw rugs and other things on the floor that can make you trip. What can I do with my stairs? Do not leave any items on the stairs. Make sure that there are handrails on both sides of the stairs and use them. Fix handrails that are broken or loose. Make sure that handrails are as long as the stairways. Check any  carpeting to make sure that it is firmly attached to the stairs. Fix any carpet that is loose or worn. Avoid having throw rugs at the top or bottom of the stairs. If you do have throw rugs, attach them to the floor with carpet tape. Make sure that you have a light switch at the top of the stairs and the bottom of the stairs. If you do not have them, ask someone to add them for you. What else can I do to help prevent falls? Wear shoes that: Do not have high heels. Have rubber bottoms. Are comfortable and fit you well. Are closed at the toe. Do not wear sandals. If you use a stepladder: Make sure that it is fully opened. Do not climb a closed  stepladder. Make sure that both sides of the stepladder are locked into place. Ask someone to hold it for you, if possible. Clearly mark and make sure that you can see: Any grab bars or handrails. First and last steps. Where the edge of each step is. Use tools that help you move around (mobility aids) if they are needed. These include: Canes. Walkers. Scooters. Crutches. Turn on the lights when you go into a dark area. Replace any light bulbs as soon as they burn out. Set up your furniture so you have a clear path. Avoid moving your furniture around. If any of your floors are uneven, fix them. If there are any pets around you, be aware of where they are. Review your medicines with your doctor. Some medicines can make you feel dizzy. This can increase your chance of falling. Ask your doctor what other things that you can do to help prevent falls. This information is not intended to replace advice given to you by your health care provider. Make sure you discuss any questions you have with your health care provider. Document Released: 02/24/2009 Document Revised: 10/06/2015 Document Reviewed: 06/04/2014 Elsevier Interactive Patient Education  2017 [employer].

## 2021-06-21 NOTE — Patient Instructions (Signed)

## 2021-06-21 NOTE — Progress Notes (Signed)
[name],acting as a Education administrator for [name], MD.,have documented all relevant documentation on the behalf of [name], MD,as directed by  [name], MD while in the presence of [name], MD.  This visit occurred during the SARS-CoV-2 public health emergency.  Safety protocols were in place, including screening questions prior to the visit, additional usage of staff PPE, and extensive cleaning of exam room while observing appropriate contact time as indicated for disinfecting solutions.  Subjective:     Patient ID: Pamela Key , female    DOB: [birth year] , 81 y.o.   MRN: [id number]   Chief Complaint  Patient presents with   Diabetes   Hypertension    HPI  She presents today for DM/BP check. She denies having any specific concerns/complaints at this time. She reports compliance with meds. She admits that she did not take her meds today. She states she purposely did not take her meds b/c she had not eaten and she doesn't take her meds without eating.  She adds her grandson is currently living with her.   She has had AWV performed by [facility] Advisor earlier today.   Diabetes She presents for her follow-up diabetic visit. She has type 2 diabetes mellitus. Her disease course has been stable. There are no hypoglycemic associated symptoms. There are no diabetic associated symptoms. Pertinent negatives for diabetes include no blurred vision, no chest pain, no polydipsia, no polyphagia and no polyuria. There are no hypoglycemic complications. There are no diabetic complications. Risk factors for coronary artery disease include diabetes mellitus, dyslipidemia and hypertension. She is following a generally healthy diet. She participates in exercise intermittently. Her home blood glucose trend is fluctuating minimally. Her breakfast blood glucose is taken between 8-9 am. Her breakfast blood glucose range is generally 90-110 mg/dl. An ACE inhibitor/angiotensin II  receptor blocker is being taken. She does not see a podiatrist.Eye exam is not current.  Hypertension This is a chronic problem. The current episode started more than 1 year ago. The problem has been gradually improving since onset. The problem is controlled. Pertinent negatives include no blurred vision or chest pain. Risk factors for coronary artery disease include diabetes mellitus, dyslipidemia, post-menopausal state, obesity and sedentary lifestyle. Past treatments include ACE inhibitors. The current treatment provides moderate improvement. Compliance problems include exercise.  Hypertensive end-organ damage includes kidney disease.    Past Medical History:  Diagnosis Date   Diabetes mellitus without complication ([place])    Gout    High cholesterol    Hypertension      Family History  Problem Relation Age of Onset   Diabetes Mother    Hypertension Mother    Cancer Father    Hypertension Father      Current Outpatient Medications:    allopurinol (ZYLOPRIM) 100 MG tablet, TAKE 1 TABLET BY MOUTH EVERY DAY, Disp: 90 tablet, Rfl: 1   Blood Glucose Monitoring Suppl (ACCU-CHEK GUIDE) w/Device KIT, Use to check blood sugars twice daily E11.65, Disp: 1 kit, Rfl: 1   donepezil (ARICEPT) 10 MG tablet, Take half tablet (5 mg) daily for 2 weeks, then increase to the full tablet at 10 mg daily, Disp: 30 tablet, Rfl: 11   ferrous sulfate 325 (65 FE) MG tablet, Take 325 mg by mouth See admin instructions. Take one tablet (325 mg) by mouth daily Monday thru Friday (skip Saturday and Sunday), Disp: , Rfl:    glucose blood (ACCU-CHEK GUIDE) test strip, Use to check blood  sugars twice daily E11.65, Disp: 100 each, Rfl: 2   glucose blood test strip, Use as instructed to check blood sugars twice daily, Disp: 100 each, Rfl: 2   Misc Natural Products (BLOOD SUGAR BALANCE PO), Take 1 capsule by mouth daily. , Disp: , Rfl:    Multiple Vitamin (MULTIVITAMIN WITH MINERALS) TABS tablet, Take 1 tablet by mouth  daily with breakfast., Disp: , Rfl:    NON FORMULARY, daily as needed. CBD capsules Pure organic hemp extract, Disp: , Rfl:    pravastatin (PRAVACHOL) 40 MG tablet, One tab po M-F and skip Saturday/Sundays, Disp: 75 tablet, Rfl: 1   valsartan (DIOVAN) 160 MG tablet, Take 1 tablet (160 mg total) by mouth daily., Disp: 90 tablet, Rfl: 1   No Known Allergies   Review of Systems  Constitutional: Negative.   Eyes:  Negative for blurred vision.  Respiratory: Negative.    Cardiovascular: Negative.  Negative for chest pain.  Endocrine: Negative for polydipsia, polyphagia and polyuria.  Neurological: Negative.   Psychiatric/Behavioral: Negative.      Today's Vitals   06/21/21 1039  BP: (!) 180/80  Pulse: 66  Temp: 98 F (36.7 C)  Weight: 184 lb 15.5 oz (83.9 kg)  Height: 5' 2.2" (1.58 m)   Body mass index is 33.61 kg/m.  Wt Readings from Last 3 Encounters:  06/21/21 184 lb 15.5 oz (83.9 kg)  06/21/21 185 lb (83.9 kg)  04/11/21 189 lb 12.8 oz (86.1 kg)    BP Readings from Last 3 Encounters:  06/21/21 (!) 180/80  06/21/21 (!) 166/60  04/11/21 120/70    Objective:  Physical Exam Vitals and nursing note reviewed.  Constitutional:      Appearance: Normal appearance. She is obese.  HENT:     Head: Normocephalic and atraumatic.     Nose:     Comments: Masked     Mouth/Throat:     Comments: Masked  Eyes:     Extraocular Movements: Extraocular movements intact.  Cardiovascular:     Rate and Rhythm: Normal rate and regular rhythm.     Heart sounds: Normal heart sounds.  Pulmonary:     Effort: Pulmonary effort is normal.     Breath sounds: Normal breath sounds.  Musculoskeletal:     Cervical back: Normal range of motion.  Skin:    General: Skin is warm.  Neurological:     General: No focal deficit present.     Mental Status: She is alert.  Psychiatric:        Mood and Affect: Mood normal.        Behavior: Behavior normal.     Assessment And Plan:     1. Type 2  diabetes mellitus with stage 2 chronic kidney disease, without long-term current use of insulin (HCC) Comments: Chronic, I will check labs as listed below. I will request her most recent eye exam from Dr. Katy Fitch. Importance of medication/dietary compliance was d/w patient. - CMP14+EGFR - Hemoglobin A1c  2. Hypertensive nephropathy Comments: Chronic, uncontrolled. She admits she did not take her meds today. I will send refill of valsartan.   3. 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 I will recheck uric acid level today.  - Uric acid  4. Class 1 obesity due to excess calories with serious comorbidity and body mass index (BMI) of 33.0 to 33.9 in adult  She is encouraged to strive  for BMI less than 30 to decrease cardiac risk. Advised to aim for at least 150 minutes of exercise per week.    Patient was given opportunity to ask questions. Patient verbalized understanding of the plan and was able to repeat key elements of the plan. All questions were answered to their satisfaction.   I, [name], MD, have reviewed all documentation for this visit. The documentation on 06/22/21 for the exam, diagnosis, procedures, and orders are all accurate and complete.   IF YOU HAVE BEEN REFERRED TO A SPECIALIST, IT MAY TAKE 1-2 WEEKS TO SCHEDULE/PROCESS THE REFERRAL. IF YOU HAVE NOT HEARD FROM US/SPECIALIST IN TWO WEEKS, PLEASE GIVE [place] A CALL AT [phone].   THE PATIENT IS ENCOURAGED TO PRACTICE SOCIAL DISTANCING DUE TO THE COVID-19 PANDEMIC.

## 2021-06-22 LAB — URIC ACID: Uric Acid: 5.8 mg/dL (ref 3.1–7.9)

## 2021-06-22 LAB — CMP14+EGFR
ALT: 25 IU/L (ref 0–32)
AST: 31 IU/L (ref 0–40)
Albumin/Globulin Ratio: 1.9 (ref 1.2–2.2)
Albumin: 4.6 g/dL (ref 3.7–4.7)
Alkaline Phosphatase: 59 IU/L (ref 44–121)
BUN/Creatinine Ratio: 13 (ref 12–28)
BUN: 11 mg/dL (ref 8–27)
Bilirubin Total: 0.3 mg/dL (ref 0.0–1.2)
CO2: 24 mmol/L (ref 20–29)
Calcium: 9.6 mg/dL (ref 8.7–10.3)
Chloride: 106 mmol/L (ref 96–106)
Creatinine, Ser: 0.85 mg/dL (ref 0.57–1.00)
Globulin, Total: 2.4 g/dL (ref 1.5–4.5)
Glucose: 102 mg/dL — ABNORMAL HIGH (ref 70–99)
Potassium: 4.3 mmol/L (ref 3.5–5.2)
Sodium: 146 mmol/L — ABNORMAL HIGH (ref 134–144)
Total Protein: 7 g/dL (ref 6.0–8.5)
eGFR: 70 mL/min/{1.73_m2} (ref >59)

## 2021-06-22 LAB — HEMOGLOBIN A1C
Est. average glucose Bld gHb Est-mCnc: 137 mg/dL
Hgb A1c MFr Bld: 6.4 % — ABNORMAL HIGH (ref 4.8–5.6)

## 2021-06-23 ENCOUNTER — Ambulatory Visit (INDEPENDENT_AMBULATORY_CARE_PROVIDER_SITE_OTHER): Payer: Medicare Other

## 2021-06-23 ENCOUNTER — Telehealth: Payer: Medicare Other

## 2021-06-23 ENCOUNTER — Other Ambulatory Visit: Payer: Self-pay

## 2021-06-23 DIAGNOSIS — N182 Chronic kidney disease, stage 2 (mild): Secondary | ICD-10-CM

## 2021-06-23 DIAGNOSIS — E538 Deficiency of other specified B group vitamins: Secondary | ICD-10-CM

## 2021-06-23 DIAGNOSIS — R413 Other amnesia: Secondary | ICD-10-CM

## 2021-06-23 DIAGNOSIS — I129 Hypertensive chronic kidney disease with stage 1 through stage 4 chronic kidney disease, or unspecified chronic kidney disease: Secondary | ICD-10-CM

## 2021-06-23 MED ORDER — ALLOPURINOL 100 MG PO TABS: 100.0000 mg | ORAL_TABLET | Freq: Every day | ORAL | 1 refills | Status: AC

## 2021-06-26 NOTE — Chronic Care Management (AMB) (Signed)
Chronic Care Management   CCM RN Visit Note  06/23/2021 Name: Pamela Key MRN: [id number] DOB: [birth year]  Subjective: Pamela Key is a 81 y.o. year old female who is a primary care patient of Glendale Chard, MD. The care management team was consulted for assistance with disease management and care coordination needs.    Engaged with patient by telephone for follow up visit in response to provider referral for case management and/or care coordination services.   Consent to Services:  The patient was given information about Chronic Care Management services, agreed to services, and gave verbal consent prior to initiation of services.  Please see initial visit note for detailed documentation.   Patient agreed to services and verbal consent obtained.   Assessment: Review of patient past medical history, allergies, medications, health status, including review of consultants reports, laboratory and other test data, was performed as part of comprehensive evaluation and provision of chronic care management services.   SDOH (Social Determinants of Health) assessments and interventions performed:    CCM Care Plan  No Known Allergies  Outpatient Encounter Medications as of 06/23/2021  Medication Sig   allopurinol (ZYLOPRIM) 100 MG tablet Take 1 tablet (100 mg total) by mouth daily.   Blood Glucose Monitoring Suppl (ACCU-CHEK GUIDE) w/Device KIT Use to check blood sugars twice daily E11.65   donepezil (ARICEPT) 10 MG tablet Take half tablet (5 mg) daily for 2 weeks, then increase to the full tablet at 10 mg daily   ferrous sulfate 325 (65 FE) MG tablet Take 325 mg by mouth See admin instructions. Take one tablet (325 mg) by mouth daily Monday thru Friday (skip Saturday and Sunday)   glucose blood (ACCU-CHEK GUIDE) test strip Use to check blood sugars twice daily E11.65   glucose blood test strip Use as instructed to check blood sugars twice daily   Misc Natural Products (BLOOD SUGAR  BALANCE PO) Take 1 capsule by mouth daily.    Multiple Vitamin (MULTIVITAMIN WITH MINERALS) TABS tablet Take 1 tablet by mouth daily with breakfast.   NON FORMULARY daily as needed. CBD capsules Pure organic hemp extract   pravastatin (PRAVACHOL) 40 MG tablet One tab po M-F and skip Saturday/Sundays   valsartan (DIOVAN) 160 MG tablet Take 1 tablet (160 mg total) by mouth daily.   No facility-administered encounter medications on file as of 06/23/2021.    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    Conditions to be addressed/monitored: Type 2 diabetes, Hypertensive nephropathy, Memory deficit, Vitamin B 12 deficiency   Care Plan : RN Care Manager Plan of Care  Updates made by Lynne Logan, RN since 06/23/2021 12:00 AM     Problem: No plan of care established for management of chronic disease states (Type 2 diabetes, Hypertensive nephropathy, Memory deficit, Vitamin B 12 deficiency)   Priority: High     Long-Range Goal: Development of plan of care for chronic disease management (Type 2 diabetes, Hypertensive nephropathy, Memory deficit, Vitamin B 12 deficiency)   Start Date: 04/13/2021  Expected End Date: 04/13/2022  Recent Progress: On track  Priority: High  Note:   Current Barriers:  Knowledge Deficits related to plan of care for management of Type 2 diabetes, Hypertensive nephropathy, Memory deficit, Vitamin B 12 deficiency   Chronic Disease Management support and education needs related to Type 2  diabetes, Hypertensive nephropathy, Memory deficit, Vitamin B 12 deficiency   Cognitive Deficits  RNCM Clinical Goal(s):  Patient will verbalize basic understanding of  Type 2 diabetes, Hypertensive nephropathy, Memory deficit, Vitamin B 12 deficiency  disease process and self health management plan as  evidenced by patient will experience no disease exacerbations related to the disease states listed above take all medications exactly as prescribed and will call provider for medication related questions as evidenced by patient/caregiver will establish a plan of care to improve patient's medication adherence continue to work with RN Care Manager to address care management and care coordination needs related to  Type 2 diabetes, Hypertensive nephropathy, Memory deficit, Vitamin B 12 deficiency  as evidenced by adherence to CM Team Scheduled appointments demonstrate ongoing self health care management ability   as evidenced by    through collaboration with RN Care manager, provider, and care team.   Interventions: 1:1 collaboration with primary care provider regarding development and update of comprehensive plan of care as evidenced by provider attestation and co-signature Inter-disciplinary care team collaboration (see longitudinal plan of care) Evaluation of current treatment plan related to  self management and patient's adherence to plan as established by provider   Diabetes Interventions:  (Status:  Goal on track:  Yes.) Long Term Goal Assessed patient's understanding of A1c goal: <6.5% Provided education to patient about basic DM disease process Reviewed medications with patient and discussed importance of medication adherence Counseled on importance of regular laboratory monitoring as prescribed Advised patient, providing education and rationale, to check cbg daily before meals and at bedtime and record, calling PCP for findings outside established parameters Review of patient status, including review of consultants reports, relevant laboratory and other test results, and medications completed Assessed social determinant of health barriers Mailed printed educational material related to Carb Choices, Meal Planning  Discussed plans with patient for ongoing care management follow up and  provided patient with direct contact information for care management team Lab Results  Component Value Date   HGBA1C 6.4 (H) 06/21/2021  Dementia Interventions:  (Status:  Goal on track:  Yes.)  Long Term Goal Evaluation of current treatment plan related to Dementia, self-management and patient's adherence to plan as established by provider Assessed for disease progression, patient reports she feels her memory loss has not worsened Educated patient on ways to help manage appointments and important events by using a calendar for reminders Encouraged patient to record conversations including people she speaks with, the reason for their call and or visit and their contact information Determined patient has moved back into her home, her grandson is residing with her Discussed ongoing collaboration with the embedded [employer], determined patient would like to have Tillie Rung help her contact [employer] to determine how she has come to be enrolled with this health plan as patient does not recall enrolling herself or speaking with her family about enrolling her in this plan Collaborated with Tillie Rung regarding patients concerns and request to assist with contacting [employer]   Discussed plans with patient for ongoing care management follow up and provided patient with direct contact information for care management team  Hypertension Interventions:  (Status:  Goal on track:  Yes.) Long Term Goal Last practice recorded BP readings:  BP Readings from Last 3 Encounters:  06/21/21 (!) 180/80  06/21/21 (!) 166/60  04/11/21 120/70  Most recent eGFR/CrCl:  Lab Results  Component Value Date   EGFR 70 06/21/2021    No components found  for: CRCL Evaluation of current treatment plan related to hypertension self management and patient's adherence to plan as established by provider Reviewed medications with patient and discussed importance of compliance Counseled on the importance of  exercise goals with target of 150 minutes per week Advised patient, providing education and rationale, to monitor blood pressure daily and record, calling PCP for findings outside established parameters Provided education on prescribed diet low Sodium Discussed complications of poorly controlled blood pressure such as heart disease, stroke, circulatory complications, vision complications, kidney impairment, sexual dysfunction Mailed printed educational materials related to Chair Exercises  Discussed plans with patient for ongoing care management follow up and provided patient with direct contact information for care management team  Patient Goals/Self-Care Activities: Take all medications as prescribed Attend all scheduled provider appointments Call pharmacy for medication refills 3-7 days in advance of running out of medications Call provider office for new concerns or questions  Work with the social worker to address care coordination needs and will continue to work with the clinical team to address health care and disease management related needs drink 6 to 8 glasses of water each day manage portion size take medications for blood pressure exactly as prescribed  Follow Up Plan:  Telephone follow up appointment with care management team member scheduled for:  08/24/21      Plan:Telephone follow up appointment with care management team member scheduled for:  08/24/21  Barb Merino, RN, BSN, CCM Care Management Coordinator [place] [facility]  Direct Phone: [phone]

## 2021-06-26 NOTE — Patient Instructions (Signed)
Visit Information  Thank you for taking time to visit with me today. Please don't hesitate to contact me if I can be of assistance to you before our next scheduled telephone appointment.  Following are the goals we discussed today:  (Copy and paste patient goals from clinical care plan here)  Our next appointment is by telephone on 08/24/21 at 10:30 AM   Please call the care guide team at [phone] if you need to cancel or reschedule your appointment.   If you are experiencing a Mental Health or Behavioral Health Crisis or need someone to talk to, please call 1-800-273-TALK (toll free, 24 hour hotline)   Patient verbalizes understanding of instructions and care plan provided today and agrees to view in MyChart. Active MyChart status confirmed with patient.    Delsa Sale, RN, BSN, CCM Care Management Coordinator [facility]  Direct Phone: [phone]

## 2021-06-27 ENCOUNTER — Ambulatory Visit: Payer: Medicare Other

## 2021-06-27 DIAGNOSIS — E1122 Type 2 diabetes mellitus with diabetic chronic kidney disease: Secondary | ICD-10-CM

## 2021-06-27 DIAGNOSIS — R413 Other amnesia: Secondary | ICD-10-CM

## 2021-06-27 DIAGNOSIS — I129 Hypertensive chronic kidney disease with stage 1 through stage 4 chronic kidney disease, or unspecified chronic kidney disease: Secondary | ICD-10-CM

## 2021-06-27 NOTE — Patient Instructions (Signed)
Social Worker Visit Information  Goals we discussed today:  Patient Goals/Self-Care Activities patient will: with the help of her family -Engage with [employer] as needed [employer] SW as needed prior to next scheduled call    Materials Provided: No: Patient declined  Patient verbalizes understanding of instructions and care plan provided today and agrees to view in [place]. Active MyChart status confirmed with patient.    Follow Up Plan: SW will follow up with patient by phone over the next month   [name], BSW, CDP Social Worker, Certified Dementia Practitioner [place] [facility] [place] [facility] [phone]

## 2021-06-27 NOTE — Chronic Care Management (AMB) (Signed)
Chronic Care Management    Social Work Note  06/27/2021 Name: Pamela Key MRN: [id number] DOB: 1940-06-24  Pamela Key is a 81 y.o. year old female who is a primary care patient of Glendale Chard, MD. The CCM team was consulted to assist the patient with chronic disease management and/or care coordination needs related to:  DM II, Hypertensive Nephropathy, Mild Late Onset Alzheimer's Dementia .   Engaged with patient by telephone for follow up visit in response to provider referral for social work chronic care management and care coordination services.   Consent to Services:  The patient was given information about Chronic Care Management services, agreed to services, and gave verbal consent prior to initiation of services.  Please see initial visit note for detailed documentation.   Patient agreed to services and consent obtained.   Assessment: Review of patient past medical history, allergies, medications, and health status, including review of relevant consultants reports was performed today as part of a comprehensive evaluation and provision of chronic care management and care coordination services.     SDOH (Social Determinants of Health) assessments and interventions performed:    Advanced Directives Status: Not addressed in this encounter.  CCM Care Plan  No Known Allergies  Outpatient Encounter Medications as of 06/27/2021  Medication Sig   allopurinol (ZYLOPRIM) 100 MG tablet Take 1 tablet (100 mg total) by mouth daily.   Blood Glucose Monitoring Suppl (ACCU-CHEK GUIDE) w/Device KIT Use to check blood sugars twice daily E11.65   donepezil (ARICEPT) 10 MG tablet Take half tablet (5 mg) daily for 2 weeks, then increase to the full tablet at 10 mg daily   ferrous sulfate 325 (65 FE) MG tablet Take 325 mg by mouth See admin instructions. Take one tablet (325 mg) by mouth daily Monday thru Friday (skip Saturday and Sunday)   glucose blood (ACCU-CHEK GUIDE) test strip  Use to check blood sugars twice daily E11.65   glucose blood test strip Use as instructed to check blood sugars twice daily   Misc Natural Products (BLOOD SUGAR BALANCE PO) Take 1 capsule by mouth daily.    Multiple Vitamin (MULTIVITAMIN WITH MINERALS) TABS tablet Take 1 tablet by mouth daily with breakfast.   NON FORMULARY daily as needed. CBD capsules Pure organic hemp extract   pravastatin (PRAVACHOL) 40 MG tablet One tab po M-F and skip Saturday/Sundays   valsartan (DIOVAN) 160 MG tablet Take 1 tablet (160 mg total) by mouth daily.   No facility-administered encounter medications on file as of 06/27/2021.    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    Conditions to be addressed/monitored: HTN, DMII, and Dementia  Care Plan : Social Work [place]  Updates made by [name] since 06/27/2021 12:00 AM     Problem: Safety - family concerned pt has dementia      Long-Range Goal: Home and Family Safety Maintained   Start Date: 09/29/2020  Recent Progress: On track  Priority: High  Note:   Current Barriers:  Chronic disease management support and education needs related to HTN and DM  Limited support system Cognitive Decline  Social Worker Clinical Goal(s):  Over the next 90 days the patient and her daughter will work with SW to identify resources to assist with patient care needs Over the next 10 days the patient and  her children will follow up on status of Medicaid application as directed by SW -Goal Met Over the next 30 days the patient will schedule an appointment with neurology -Goal Met SW Interventions:  Inter-disciplinary care team collaboration (see longitudinal plan of care) Collaboration with Glendale Chard, MD regarding development and update of comprehensive plan of care as  evidenced by provider attestation and co-signature Collaboration with [place] who requests SW contact the patient to assist with determining health plan benefits Telephonic visit completed with the patient who reports she only has Part A Medicare and does not have Part B Reviewed with the patient she has both Traditional Medicare as well as a Medicare Advantage plan through [place] offered under her state health plan which acts as both Part A and Part B Determined patient is unsure if she must present these cards with her at each office visit Advised the patient the updated 2023 cards have been scanned into the patients chart and therefore she most likely will not have to present them at every office visit Discussed patient will plan to bring them with her to future appointments Collaboration with [place] to update on visit interventions  Patient Goals/Self-Care Activities patient will: with the help of her family -Engage with [facility] as needed [employer] SW as needed prior to next scheduled call  Follow Up Plan:  SW will follow up with the patient on 3.9.23       Follow Up Plan: SW will follow up with patient by phone over the next month.      [name], BSW, CDP Social Worker, Certified Dementia Practitioner [place] / [place] [facility] [phone]

## 2021-07-06 ENCOUNTER — Ambulatory Visit (INDEPENDENT_AMBULATORY_CARE_PROVIDER_SITE_OTHER): Payer: BC Managed Care – PPO | Admitting: Sports Medicine

## 2021-07-06 ENCOUNTER — Other Ambulatory Visit: Payer: Self-pay

## 2021-07-06 ENCOUNTER — Encounter: Payer: Self-pay | Admitting: Sports Medicine

## 2021-07-06 DIAGNOSIS — M79674 Pain in right toe(s): Secondary | ICD-10-CM

## 2021-07-06 DIAGNOSIS — M19079 Primary osteoarthritis, unspecified ankle and foot: Secondary | ICD-10-CM

## 2021-07-06 DIAGNOSIS — E1165 Type 2 diabetes mellitus with hyperglycemia: Secondary | ICD-10-CM

## 2021-07-06 DIAGNOSIS — M79675 Pain in left toe(s): Secondary | ICD-10-CM

## 2021-07-06 DIAGNOSIS — B351 Tinea unguium: Secondary | ICD-10-CM

## 2021-07-06 DIAGNOSIS — Z8739 Personal history of other diseases of the musculoskeletal system and connective tissue: Secondary | ICD-10-CM

## 2021-07-06 DIAGNOSIS — M2040 Other hammer toe(s) (acquired), unspecified foot: Secondary | ICD-10-CM

## 2021-07-06 DIAGNOSIS — M7989 Other specified soft tissue disorders: Secondary | ICD-10-CM

## 2021-07-06 DIAGNOSIS — M21611 Bunion of right foot: Secondary | ICD-10-CM

## 2021-07-06 NOTE — Progress Notes (Signed)
Subjective: Pamela Key is a 81 y.o. female patient with history of diabetes who presents to office today complaining of long,mildly painful nails  while ambulating in shoes; unable to trim. Patient also states that this morning she noticed some swelling on the left ankle states that it does not hurt but she thought she would mention it since she is coming here.  Patient reports that she last saw her PCP on 06/21/2021.  Patient denies any other pedal complaints at this time  Fasting blood sugar patient did not record states that she needs to get her a new meter.  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   Current Outpatient Medications on File Prior to Visit  Medication Sig Dispense Refill   allopurinol (ZYLOPRIM) 100 MG tablet Take 1 tablet (100 mg total) by mouth daily. 90 tablet 1   Blood Glucose Monitoring Suppl (ACCU-CHEK GUIDE) w/Device KIT Use to check blood sugars twice daily E11.65 1 kit 1   donepezil (ARICEPT) 10 MG tablet Take half tablet (5 mg) daily for 2 weeks, then increase to the full tablet at 10 mg daily 30 tablet 11   ferrous sulfate 325 (65 FE) MG tablet Take 325 mg by mouth See admin instructions. Take one tablet (325 mg) by mouth daily Monday thru Friday (skip Saturday and Sunday)     glucose blood (ACCU-CHEK GUIDE) test strip Use to check blood sugars twice daily E11.65 100 each 2   glucose blood test strip Use as instructed to check blood sugars twice daily 100 each 2   Misc Natural Products (BLOOD SUGAR BALANCE PO) Take 1 capsule by mouth daily.      Multiple Vitamin (MULTIVITAMIN WITH MINERALS) TABS tablet Take 1 tablet by mouth daily with breakfast.     NON FORMULARY daily as needed. CBD capsules Pure organic hemp extract     pravastatin (PRAVACHOL) 40 MG tablet One tab po M-F and skip  Saturday/Sundays 75 tablet 1   valsartan (DIOVAN) 160 MG tablet Take 1 tablet (160 mg total) by mouth daily. 90 tablet 1   No current facility-administered medications on file prior to visit.   No Known Allergies  Recent Results (from the past 2160 hour(s))  Lipid panel     Status: Abnormal   Collection Time: 04/11/21  2:53 PM  Result Value Ref Range   Cholesterol, Total 187 100 - 199 mg/dL   Triglycerides 154 (H) 0 - 149 mg/dL   HDL 58 >39 mg/dL   VLDL Cholesterol Cal 27 5 - 40 mg/dL   LDL Chol Calc (NIH) 102 (H) 0 - 99 mg/dL   Chol/HDL Ratio 3.2 0.0 - 4.4 ratio    Comment:                                   T. Chol/HDL Ratio                                             Men  Women  1/2 Avg.Risk  3.4    3.3                                   Avg.Risk  5.0    4.4                                2X Avg.Risk  9.6    7.1                                3X Avg.Risk 23.4   11.0   Uric acid     Status: None   Collection Time: 04/11/21  2:53 PM  Result Value Ref Range   Uric Acid 7.3 3.1 - 7.9 mg/dL    Comment:            Therapeutic target for gout patients: <6.0  POCT Urinalysis Dipstick (54650)     Status: Abnormal   Collection Time: 04/11/21  5:04 PM  Result Value Ref Range   Color, UA     Clarity, UA     Glucose, UA Negative Negative   Bilirubin, UA Negative    Ketones, UA Negative    Spec Grav, UA 1.020 1.010 - 1.025   Blood, UA trace intact    pH, UA 6.0 5.0 - 8.0   Protein, UA Negative Negative   Urobilinogen, UA 0.2 0.2 or 1.0 E.U./dL   Nitrite, UA negative    Leukocytes, UA Negative Negative   Appearance     Odor    POCT UA - Microalbumin     Status: Normal   Collection Time: 04/11/21  5:04 PM  Result Value Ref Range   Microalbumin Ur, POC 10 mg/L   Creatinine, POC 100 mg/dL   Albumin/Creatinine Ratio, Urine, POC <30   Uric acid     Status: None   Collection Time: 06/21/21 12:02 PM  Result Value Ref Range   Uric Acid 5.8 3.1 - 7.9 mg/dL     Comment:            Therapeutic target for gout patients: <6.0  CMP14+EGFR     Status: Abnormal   Collection Time: 06/21/21 12:02 PM  Result Value Ref Range   Glucose 102 (H) 70 - 99 mg/dL   BUN 11 8 - 27 mg/dL   Creatinine, Ser 0.85 0.57 - 1.00 mg/dL   eGFR 70 >59 mL/min/1.73   BUN/Creatinine Ratio 13 12 - 28   Sodium 146 (H) 134 - 144 mmol/L   Potassium 4.3 3.5 - 5.2 mmol/L   Chloride 106 96 - 106 mmol/L   CO2 24 20 - 29 mmol/L   Calcium 9.6 8.7 - 10.3 mg/dL   Total Protein 7.0 6.0 - 8.5 g/dL   Albumin 4.6 3.7 - 4.7 g/dL   Globulin, Total 2.4 1.5 - 4.5 g/dL   Albumin/Globulin Ratio 1.9 1.2 - 2.2   Bilirubin Total 0.3 0.0 - 1.2 mg/dL   Alkaline Phosphatase 59 44 - 121 IU/L   AST 31 0 - 40 IU/L   ALT 25 0 - 32 IU/L  Hemoglobin A1c     Status: Abnormal   Collection Time: 06/21/21 12:02 PM  Result Value Ref Range   Hgb A1c MFr Bld 6.4 (H) 4.8 - 5.6 %    Comment:  Prediabetes: 5.7 - 6.4          Diabetes: >6.4          Glycemic control for adults with diabetes: <7.0    Est. average glucose Bld gHb Est-mCnc 137 mg/dL    Objective: General: Patient is awake, alert, and oriented x 3 and in no acute distress.  Integument: Skin is warm, dry and supple bilateral. Nails are tender, long, thickened and dystrophic with subungual debris, consistent with onychomycosis, 1-5 bilateral. No signs of infection. No open lesions or preulcerative lesions present bilateral. Remaining integument unremarkable.  Vasculature:  Dorsalis Pedis pulse 1/4 bilateral. Posterior Tibial pulse 1/4 bilateral.  Capillary fill time <3 sec 1-5 bilateral. Positive hair growth to the level of the digits. Temperature gradient within normal limits. No varicosities present bilateral.  Focal edema noted to the left lower extremity 1+ pitting in nature without warmth no pain to calf no acute concern for DVT  Neurology: Gross sensation present via light touch bilateral.  Musculoskeletal: Asymptomatic bunion  and hammertoe pes planus and swelling at left ankle that is nontender.  Assessment and Plan: Problem List Items Addressed This Visit       Endocrine   Uncontrolled diabetes mellitus with hyperglycemia ([place])   Other Visit Diagnoses     Pain due to onychomycosis of toenails of both feet    -  Primary   Swelling of left lower extremity       Arthritis of big toe       Bunion of great toe of right foot       Hammer toe, unspecified laterality       History of gout           -Examined patient. -Discussed and educated patient on diabetic foot care, especially with  regards to the vascular, neurological and musculoskeletal systems.  -Stressed the importance of good glycemic control and the detriment of not  controlling glucose levels in relation to the foot. -Mechanically debrided all nails 1-5 bilateral using sterile nail nipper and filed with dremel without incident  -Advised patient to continue with taking her gout medication in case she is getting a gout flareup on the left ankle -Dispensed a Surgigrip compression sleeve for patient to use for edema control on the left ankle -Advised patient that if pain or swelling on the left ankle worsens to discuss with her PCP may require further work-up -Answered all patient questions -Patient to return  in 3 months for at risk foot care -Patient advised to call the office if any problems or questions arise in the meantime.  Landis Martins, DPM

## 2021-07-11 ENCOUNTER — Ambulatory Visit: Payer: BC Managed Care – PPO

## 2021-07-11 ENCOUNTER — Other Ambulatory Visit: Payer: Self-pay

## 2021-07-11 VITALS — BP 134/80 | HR 70 | Temp 98.0°F | Ht 64.0 in | Wt 188.0 lb

## 2021-07-11 DIAGNOSIS — I129 Hypertensive chronic kidney disease with stage 1 through stage 4 chronic kidney disease, or unspecified chronic kidney disease: Secondary | ICD-10-CM

## 2021-07-11 DIAGNOSIS — N182 Chronic kidney disease, stage 2 (mild): Secondary | ICD-10-CM | POA: Diagnosis not present

## 2021-07-11 DIAGNOSIS — E1122 Type 2 diabetes mellitus with diabetic chronic kidney disease: Secondary | ICD-10-CM

## 2021-07-11 NOTE — Progress Notes (Signed)
Patient presents today for BPC. She is currently taking valsartan 160.  BP Readings from Last 3 Encounters:  07/11/21 134/80  06/21/21 (!) 180/80  06/21/21 (!) 166/60

## 2021-07-20 ENCOUNTER — Ambulatory Visit (INDEPENDENT_AMBULATORY_CARE_PROVIDER_SITE_OTHER): Payer: Medicare Other

## 2021-07-20 DIAGNOSIS — I129 Hypertensive chronic kidney disease with stage 1 through stage 4 chronic kidney disease, or unspecified chronic kidney disease: Secondary | ICD-10-CM

## 2021-07-20 DIAGNOSIS — R413 Other amnesia: Secondary | ICD-10-CM

## 2021-07-20 DIAGNOSIS — N182 Chronic kidney disease, stage 2 (mild): Secondary | ICD-10-CM

## 2021-07-20 NOTE — Patient Instructions (Signed)
Social Worker Visit Information ? ?Goals we discussed today:  ? Goals Addressed   ? ?  ?  ?  ?  ? This Visit's Progress  ?  Home and Family Safety Maintained   On track  ?  Timeframe:  Long-Range Goal ?Priority:  High ?Start Date:  5.19.22                                         ? ?Next planned outreach: 5.23.23 ? ?Patient Goals/Self-Care Activities ?patient will: with the help of her family ?-Engage with [facility] in [facility] aid program ?-Contact Neurologist Katy Fitch to discuss concern for patients ability to manager her own finances ?-Contact SW as needed prior to next scheduled call ?  ? ?  ?  ? ?Materials Provided: Verbal education about dementia provided by phone ? ?Patient verbalizes understanding of instructions and care plan provided today and agrees to view in MyChart. Active MyChart status confirmed with patient.   ? ?Follow Up Plan: SW will follow up with patient by phone over the next 90 days. ? ? ?[name], BSW, CDP ?Social Worker, Certified Dementia Practitioner ?[facility] / [facility] ?[phone] ? ?   ? ?

## 2021-07-20 NOTE — Chronic Care Management (AMB) (Signed)
?Chronic Care Management  ? ? Social Work Note ? ?07/20/2021 ?Name: Pamela Key MRN: [id number] DOB: 1941-01-26 ? ?Pamela Key is a 81 y.o. year old female who is a primary care patient of Glendale Chard, MD. The CCM team was consulted to assist the patient with chronic disease management and/or care coordination needs related to:  DM II, Hypertensive Nephropathy, Mild Late Onset Alzheimer's Dementia .  ? ?Engaged with patients son and daughter by phone  for follow up visit in response to provider referral for social work chronic care management and care coordination services.  ? ?Consent to Services:  ?The patient was given information about Chronic Care Management services, agreed to services, and gave verbal consent prior to initiation of services.  Please see initial visit note for detailed documentation.  ? ?Patient agreed to services and consent obtained.  ? ?Assessment: Review of patient past medical history, allergies, medications, and health status, including review of relevant consultants reports was performed today as part of a comprehensive evaluation and provision of chronic care management and care coordination services.    ? ?SDOH (Social Determinants of Health) assessments and interventions performed:  ?SDOH Interventions   ? ?Flowsheet Row Most Recent Value  ?SDOH Interventions   ?Food Insecurity Interventions Intervention Not Indicated  ?Housing Interventions Intervention Not Indicated  ?Transportation Interventions Intervention Not Indicated  ? ?  ?  ? ?Advanced Directives Status: Not addressed in this encounter. ? ?CCM Care Plan ? ?No Known Allergies ? ?Outpatient Encounter Medications as of 07/20/2021  ?Medication Sig  ? allopurinol (ZYLOPRIM) 100 MG tablet Take 1 tablet (100 mg total) by mouth daily.  ? Blood Glucose Monitoring Suppl (ACCU-CHEK GUIDE) w/Device KIT Use to check blood sugars twice daily E11.65  ? donepezil (ARICEPT) 10 MG tablet Take half tablet (5 mg) daily for 2 weeks,  then increase to the full tablet at 10 mg daily  ? ferrous sulfate 325 (65 FE) MG tablet Take 325 mg by mouth See admin instructions. Take one tablet (325 mg) by mouth daily Monday thru Friday (skip Saturday and Sunday)  ? glucose blood (ACCU-CHEK GUIDE) test strip Use to check blood sugars twice daily E11.65  ? glucose blood test strip Use as instructed to check blood sugars twice daily  ? Misc Natural Products (BLOOD SUGAR BALANCE PO) Take 1 capsule by mouth daily.   ? Multiple Vitamin (MULTIVITAMIN WITH MINERALS) TABS tablet Take 1 tablet by mouth daily with breakfast.  ? NON FORMULARY daily as needed. CBD capsules ?Pure organic hemp extract  ? pravastatin (PRAVACHOL) 40 MG tablet One tab po M-F and skip Saturday/Sundays  ? valsartan (DIOVAN) 160 MG tablet Take 1 tablet (160 mg total) by mouth daily.  ? ?No facility-administered encounter medications on file as of 07/20/2021.  ? ? ?Patient Active Problem List  ? Diagnosis Date Noted  ? Pure hypercholesterolemia 06/17/2018  ? Uncontrolled diabetes mellitus with hyperglycemia ([place])   ? Benign hypertension with chronic kidney disease, stage II 03/01/2018  ? ? ?Conditions to be addressed/monitored:  DM II, Hypertensive Nephropathy, Mild Late Onset Alzheimer's Dementia ; Limited access to caregiver and Memory Deficits ? ?Care Plan : Social Work [place]  ?Updates made by [name] since 07/20/2021 12:00 AM  ?  ? ?Problem: Safety - family concerned pt has dementia   ?  ? ?Long-Range Goal: Home and Family Safety Maintained   ?Start Date: 09/29/2020  ?This Visit's Progress: On track  ?Recent Progress: On track  ?Priority: High  ?  Note:   ?Current Barriers:  ?Chronic disease management support and education needs related to HTN and DM  ?Limited support system ?Cognitive Decline ? ?Social Worker Clinical Goal(s):  ?Over the next 90 days the patient and her daughter will work with SW to identify resources to assist with patient care needs ?Over the next 10 days the  patient and her children will follow up on status of Medicaid application as directed by SW -Goal Met ?Over the next 30 days the patient will schedule an appointment with neurology -Goal Met ?SW Interventions:  ?Inter-disciplinary care team collaboration (see longitudinal plan of care) ?Collaboration with Glendale Chard, MD regarding development and update of comprehensive plan of care as evidenced by provider attestation and co-signature ?Telephonic visit completed with the patients son [place] and daughter Karie Schwalbe ?Determined the patient did receive an in home assessment from the SW with the [facility] in home aid program - patient has been awarded 10 hours of caregiver assistance ?Discussed this service has not yet started as they are awaiting an agency assignment ?Family reports concern with patients ability to make decisions indicating she has recently withdrawn money from her bank account which caused overdraft due to automatic bill drafts ?Patients children are designated as Personal assistant of attorney but question when this may come into effect ?Advised the family that although they have this document completed, until the patient is deemed incompetent she is still able to make her own healthcare and financial decisions ?Performed chart review to note last visit with neurologist was  11.2.22 with Sharene Butters. Patients AVS indicated she should be monitored with financial spending due to diagnosis of Alzheimer's Dementia ?Encouraged the patients children to contact the patients neurologist prior to future appointment to discuss concern with patients ability to manage her own finances ?Discussed that if the patients neurologist deems the patient is unable to manage her own finances they could request medical documentation in order for the financial power of attorney to be in effect. Family stated understanding ?Scheduled follow up call over the next 90 days ? ?Patient Goals/Self-Care  Activities ?patient will: with the help of her family ?-Engage with [facility] in home aid program ?-Contact Neurologist Georgeanne Nim to discuss concern for patients ability to manager her own finances ?-Contact SW as needed prior to next scheduled call ? ?Follow Up Plan:  SW will follow up with the patient on 5.23.23 ? ?  ?  ? ?Follow Up Plan: SW will follow up with patient by phone over the next 90 days. ?     ?[name], BSW, CDP ?Social Worker, Certified Dementia Practitioner ?[facility] / [place] [facility] ?[phone] ? ?   ? ? ? ? ?

## 2021-07-28 ENCOUNTER — Other Ambulatory Visit: Payer: Self-pay | Admitting: Internal Medicine

## 2021-07-28 ENCOUNTER — Other Ambulatory Visit: Payer: Self-pay

## 2021-07-28 ENCOUNTER — Ambulatory Visit
Admission: RE | Admit: 2021-07-28 | Discharge: 2021-07-28 | Disposition: A | Discharge status: Home / Self Care | Payer: BC Managed Care – PPO | Source: Ambulatory Visit | Attending: Internal Medicine | Admitting: Internal Medicine

## 2021-07-28 DIAGNOSIS — Z1231 Encounter for screening mammogram for malignant neoplasm of breast: Secondary | ICD-10-CM

## 2021-08-07 ENCOUNTER — Ambulatory Visit: Payer: Medicare Other

## 2021-08-11 DIAGNOSIS — E1122 Type 2 diabetes mellitus with diabetic chronic kidney disease: Secondary | ICD-10-CM

## 2021-08-11 DIAGNOSIS — I129 Hypertensive chronic kidney disease with stage 1 through stage 4 chronic kidney disease, or unspecified chronic kidney disease: Secondary | ICD-10-CM

## 2021-08-11 DIAGNOSIS — N182 Chronic kidney disease, stage 2 (mild): Secondary | ICD-10-CM

## 2021-08-14 ENCOUNTER — Ambulatory Visit: Payer: Medicare Other

## 2021-08-22 ENCOUNTER — Ambulatory Visit: Payer: Medicare Other

## 2021-08-24 ENCOUNTER — Telehealth: Payer: Medicare Other

## 2021-08-24 ENCOUNTER — Ambulatory Visit (INDEPENDENT_AMBULATORY_CARE_PROVIDER_SITE_OTHER): Payer: Medicare Other

## 2021-08-24 DIAGNOSIS — E1122 Type 2 diabetes mellitus with diabetic chronic kidney disease: Secondary | ICD-10-CM

## 2021-08-24 DIAGNOSIS — E538 Deficiency of other specified B group vitamins: Secondary | ICD-10-CM

## 2021-08-24 DIAGNOSIS — I129 Hypertensive chronic kidney disease with stage 1 through stage 4 chronic kidney disease, or unspecified chronic kidney disease: Secondary | ICD-10-CM

## 2021-08-24 DIAGNOSIS — R413 Other amnesia: Secondary | ICD-10-CM

## 2021-08-24 NOTE — Chronic Care Management (AMB) (Signed)
?Chronic Care Management  ? ?CCM RN Visit Note ? ?08/24/2021 ?Name: Pamela Key MRN: [id number] DOB: 1940/12/18 ? ?Subjective: ?Pamela Key is a 81 y.o. year old female who is a primary care patient of Glendale Chard, MD. The care management team was consulted for assistance with disease management and care coordination needs.   ? ?Engaged with patient by telephone for follow up visit in response to provider referral for case management and/or care coordination services.  ? ?Consent to Services:  ?The patient was given information about Chronic Care Management services, agreed to services, and gave verbal consent prior to initiation of services.  Please see initial visit note for detailed documentation.  ? ?Patient agreed to services and verbal consent obtained.  ? ?Assessment: Review of patient past medical history, allergies, medications, health status, including review of consultants reports, laboratory and other test data, was performed as part of comprehensive evaluation and provision of chronic care management services.  ? ?SDOH (Social Determinants of Health) assessments and interventions performed:   ? ?CCM Care Plan ? ?No Known Allergies ? ?Outpatient Encounter Medications as of 08/24/2021  ?Medication Sig  ? allopurinol (ZYLOPRIM) 100 MG tablet Take 1 tablet (100 mg total) by mouth daily.  ? Blood Glucose Monitoring Suppl (ACCU-CHEK GUIDE) w/Device KIT Use to check blood sugars twice daily E11.65  ? donepezil (ARICEPT) 10 MG tablet Take half tablet (5 mg) daily for 2 weeks, then increase to the full tablet at 10 mg daily  ? ferrous sulfate 325 (65 FE) MG tablet Take 325 mg by mouth See admin instructions. Take one tablet (325 mg) by mouth daily Monday thru Friday (skip Saturday and Sunday)  ? glucose blood (ACCU-CHEK GUIDE) test strip Use to check blood sugars twice daily E11.65  ? glucose blood test strip Use as instructed to check blood sugars twice daily  ? Misc Natural Products (BLOOD SUGAR  BALANCE PO) Take 1 capsule by mouth daily.   ? Multiple Vitamin (MULTIVITAMIN WITH MINERALS) TABS tablet Take 1 tablet by mouth daily with breakfast.  ? NON FORMULARY daily as needed. CBD capsules ?Pure organic hemp extract  ? pravastatin (PRAVACHOL) 40 MG tablet One tab po M-F and skip Saturday/Sundays  ? valsartan (DIOVAN) 160 MG tablet Take 1 tablet (160 mg total) by mouth daily.  ? ?No facility-administered encounter medications on file as of 08/24/2021.  ? ? ?Patient Active Problem List  ? Diagnosis Date Noted  ? Pure hypercholesterolemia 06/17/2018  ? Uncontrolled diabetes mellitus with hyperglycemia ([place])   ? Benign hypertension with chronic kidney disease, stage II 03/01/2018  ? ? 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 ?Care Plan : RN Care Manager Plan of Care  ?Updates made by Lynne Logan, RN since 08/24/2021 12:00 AM  ?  ? ?Problem: No plan of care established for management of chronic disease states (Type 2 diabetes, Hypertensive nephropathy, Memory deficit, Vitamin B 12 deficiency)   ?Priority: High  ?  ? ?Long-Range Goal: Development of plan of care for chronic disease management (Type 2 diabetes, Hypertensive nephropathy, Memory deficit, Vitamin B 12 deficiency)   ?Start Date: 04/13/2021  ?Expected End Date: 04/13/2022  ?Recent Progress: On track  ?Priority: High  ?Note:   ?Current Barriers:  ?Knowledge Deficits related to plan of care for management of Type 2 diabetes, Hypertensive nephropathy, Memory deficit, Vitamin B 12 deficiency   ?Chronic Disease Management support and education needs related to Type 2  diabetes, Hypertensive nephropathy, Memory deficit, Vitamin B 12 deficiency   ?Cognitive Deficits ? ?RNCM Clinical Goal(s):  ?Patient will verbalize basic understanding of  Type 2 diabetes, Hypertensive nephropathy, Memory deficit, Vitamin B 12 deficiency  disease process and self health management plan as  evidenced by patient will experience no disease exacerbations related to the disease states listed above ?take all medications exactly as prescribed and will call provider for medication related questions as evidenced by patient/caregiver will establish a plan of care to improve patient's medication adherence ?continue to work with RN Care Manager to address care management and care coordination needs related to  Type 2 diabetes, Hypertensive nephropathy, Memory deficit, Vitamin B 12 deficiency  as evidenced by adherence to CM Team Scheduled appointments ?demonstrate ongoing self health care management ability   as evidenced by    through collaboration with RN Care manager, provider, and care team.  ? ?Interventions: ?1:1 collaboration with primary care provider regarding development and update of comprehensive plan of care as evidenced by provider attestation and co-signature ?Inter-disciplinary care team collaboration (see longitudinal plan of care) ?Evaluation of current treatment plan related to  self management and patient's adherence to plan as established by provider ? ?Diabetes Interventions:  (Status:  Goal on track:  Yes.) Long Term Goal ?Assessed patient's understanding of A1c goal:  <5.7 % ?Provided education to patient about basic DM disease process ?Provided patient with written educational materials related to hypo and hyperglycemia and importance of correct treatment ?Advised patient, providing education and rationale, to check cbg daily before breakfast and record, calling PCP for findings outside established parameters ?Review of patient status, including review of consultants reports, relevant laboratory and other test results, and medications completed ?Assessed social determinant of health barriers ?Educated patient on dietary and exercise recommendations; daily glycemic control FBS 80-130, <180 after meals, patient admits she does not eat breakfast, she eats 2 meals per day with an occasional  snack ?Educated patient on the dangers of skipping meals, and may increase her blood sugars and cause increase her A1c, re-educated patient regarding the plate method and choosing low carb healthy meals/snacks ?Determined patient is exercising several times a week at the [facility] ?Mailed printed educational materials related Mediterranean diet, Chair Exercises and the Dangers in Skipping Meals ?Reviewed scheduled/upcoming provider appointments including: next PCP follow up appointment scheduled for 10/02/21 @11 :40 AM; Dr. Cannon Kettle for foot care scheduled for 10/05/21 @10 :15 AM ?Discussed plans with patient for ongoing care management follow up and provided patient with direct contact information for care management team ?Lab Results  ?Component Value Date  ? HGBA1C 6.4 (H) 06/21/2021  ?Dementia Interventions:  (Status:  Goal on track:  Yes.)  Long Term Goal ?Evaluation of current treatment plan related to Dementia, self-management and patient's adherence to plan as established by provider ?Assessed for disease progression, patient reports she feels her memory loss has not worsened ?Determined patient's grandson continues to live with her and is helping monitor for changes in her memory ?Discussed patient currently reports no home safety concerns at this time, she does not drive, she attends the [employer] several times a week in which this center provides transportation and she is getting social interaction and exercise  ?Determined patient is following a meal schedule and admits she only eats 2 meals per day and has an occasional snack ?Determined patient continues to self administer her medications, she is using a pill box and has established a daily routine, she reports having no missed doses of  her medications  ?Reviewed scheduled/upcoming provider appointment including: next Neuro follow up is scheduled for 09/12/21 @11 :00 AM ?Discussed plans with patient for ongoing care management follow up and provided  patient with direct contact information for care management team ? ?Hypertension Interventions:  (Status:  Goal on track:  Yes.) Long Term Goal ?Last practice recorded BP readings:  ?BP Readings from Last 3 Enc

## 2021-08-30 ENCOUNTER — Telehealth: Payer: Self-pay | Admitting: Physician Assistant

## 2021-08-30 NOTE — Telephone Encounter (Signed)
Patients daughter has some concerns with her mother. Pamela Key gets very frustrated when they talk about her dementia. The daughter wants to talk with someone and let them know before her next appt. ?

## 2021-08-31 ENCOUNTER — Ambulatory Visit: Payer: Medicare Other

## 2021-09-04 ENCOUNTER — Ambulatory Visit (INDEPENDENT_AMBULATORY_CARE_PROVIDER_SITE_OTHER): Payer: BC Managed Care – PPO

## 2021-09-04 DIAGNOSIS — E1165 Type 2 diabetes mellitus with hyperglycemia: Secondary | ICD-10-CM

## 2021-09-04 DIAGNOSIS — M2041 Other hammer toe(s) (acquired), right foot: Secondary | ICD-10-CM | POA: Diagnosis not present

## 2021-09-04 DIAGNOSIS — M2042 Other hammer toe(s) (acquired), left foot: Secondary | ICD-10-CM

## 2021-09-04 DIAGNOSIS — M21611 Bunion of right foot: Secondary | ICD-10-CM

## 2021-09-04 DIAGNOSIS — M2040 Other hammer toe(s) (acquired), unspecified foot: Secondary | ICD-10-CM

## 2021-09-04 NOTE — Progress Notes (Signed)
SITUATION ?Reason for Visit: Fitting of Diabetic Shoes & Insoles ?Patient / Caregiver Report:  Patient is satisfied with fit and function of shoes and insoles. ? ?OBJECTIVE DATA: ?Patient History / Diagnosis:   ?  ICD-10-CM   ?1. Uncontrolled type 2 diabetes mellitus with hyperglycemia (HCC)  E11.65   ?  ?2. Bunion of great toe of right foot  M21.611   ?  ?3. Hammer toe, unspecified laterality  M20.40   ?  ? ? ?Change in Status:   None ? ?ACTIONS PERFORMED: ?In-Person Delivery, patient was fit with: ?- 1x pair [id number] PDAC approved prefabricated Diabetic Shoes: Apex X801W ?- 3x pair [id number] PDAC approved vacuum formed custom diabetic insoles; [employer]: [id number] ? ?Shoes and insoles were verified for structural integrity and safety. Patient wore shoes and insoles in office. Skin was inspected and free of areas of concern after wearing shoes and inserts. Shoes and inserts fit properly. Patient / Caregiver provided with ferbal instruction and demonstration regarding donning, doffing, wear, care, proper fit, function, purpose, cleaning, and use of shoes and insoles ' and in all related precautions and risks and benefits regarding shoes and insoles. Patient / Caregiver was instructed to wear properly fitting socks with shoes at all times. Patient was also provided with verbal instruction regarding how to report any failures or malfunctions of shoes or inserts, and necessary follow up care. Patient / Caregiver was also instructed to contact physician regarding change in status that may affect function of shoes and inserts.  ? ?Patient / Caregiver verbalized undersatnding of instruction provided. Patient / Caregiver demonstrated independence with proper donning and doffing of shoes and inserts. ? ?PLAN ?Patient to follow with treating physician as recommended. Plan of care was discussed with and agreed upon by patient and/or caregiver. All questions were answered and concerns addressed. ? ?

## 2021-09-10 DIAGNOSIS — E1122 Type 2 diabetes mellitus with diabetic chronic kidney disease: Secondary | ICD-10-CM

## 2021-09-10 DIAGNOSIS — N182 Chronic kidney disease, stage 2 (mild): Secondary | ICD-10-CM | POA: Diagnosis not present

## 2021-09-10 DIAGNOSIS — I129 Hypertensive chronic kidney disease with stage 1 through stage 4 chronic kidney disease, or unspecified chronic kidney disease: Secondary | ICD-10-CM | POA: Diagnosis not present

## 2021-09-12 ENCOUNTER — Ambulatory Visit (INDEPENDENT_AMBULATORY_CARE_PROVIDER_SITE_OTHER): Payer: BC Managed Care – PPO | Admitting: Physician Assistant

## 2021-09-12 ENCOUNTER — Encounter: Payer: Self-pay | Admitting: Physician Assistant

## 2021-09-12 VITALS — BP 191/75 | HR 79 | Resp 18 | Ht 63.0 in | Wt 190.0 lb

## 2021-09-12 DIAGNOSIS — F02A18 Dementia in other diseases classified elsewhere, mild, with other behavioral disturbance: Secondary | ICD-10-CM

## 2021-09-12 DIAGNOSIS — G301 Alzheimer's disease with late onset: Secondary | ICD-10-CM | POA: Diagnosis not present

## 2021-09-12 NOTE — Patient Instructions (Addendum)
It was a pleasure to see you today at our office.  ? ?Recommendations: ? ?Meds: ?Follow up in  6 months ?Continue Donepezil 10 daily    ?Please contact Dr. Anthoney Harada for assessment of decision mental capacity [phone] ?You can also call [facility] [phone] for guidance on geriatric dementia issues   ? ?RECOMMENDATIONS FOR ALL PATIENTS WITH MEMORY PROBLEMS: ?1. Continue to exercise (Recommend 30 minutes of walking everyday, or 3 hours every week) ?2. Increase social interactions - continue going to [place] and enjoy social gatherings with friends and family ?3. Eat healthy, avoid fried foods and eat more fruits and vegetables ?4. Maintain adequate blood pressure, blood sugar, and blood cholesterol level. Reducing the risk of stroke and cardiovascular disease also helps promoting better memory. ?5. Avoid stressful situations. Live a simple life and avoid aggravations. Organize your time and prepare for the next day in anticipation. ?6. Sleep well, avoid any interruptions of sleep and avoid any distractions in the bedroom that may interfere with adequate sleep quality ?7. Avoid sugar, avoid sweets as there is a strong link between excessive sugar intake, diabetes, and cognitive impairment ?We discussed the Mediterranean diet, which has been shown to help patients reduce the risk of progressive memory disorders and reduces cardiovascular risk. This includes eating fish, eat fruits and green leafy vegetables, nuts like almonds and hazelnuts, walnuts, and also use olive oil. Avoid fast foods and fried foods as much as possible. Avoid sweets and sugar as sugar use has been linked to worsening of memory function. ? ?There is always a concern of gradual progression of memory problems. If this is the case, then we may need to adjust level of care according to patient needs. Support, both to the patient and caregiver, should then be put into place.  ? ? ?The Alzheimer?s Association is here all day,  every day for people facing Alzheimer?s disease through our free 24/7 Helpline: [phone]. The Helpline provides reliable information and support to all those who need assistance, such as individuals living with memory loss, Alzheimer's or other dementia, caregivers, health care professionals and the public.  ?Our highly trained and knowledgeable staff can help you with: ?Understanding memory loss, dementia and Alzheimer's  ?Medications and other treatment options  ?General information about aging and brain health  ?Skills to provide quality care and to find the best care from professionals  ?Legal, financial and living-arrangement decisions ?Our Helpline also features: ?Confidential care consultation provided by master's level clinicians who can help with decision-making support, crisis assistance and education on issues families face every day  ?Help in a caller's preferred language using our translation service that features more than 200 languages and dialects  ?Referrals to local community programs, services and ongoing support ? ? ? ? ?FALL PRECAUTIONS: Be cautious when walking. Scan the area for obstacles that may increase the risk of trips and falls. When getting up in the mornings, sit up at the edge of the bed for a few minutes before getting out of bed. Consider elevating the bed at the head end to avoid drop of blood pressure when getting up. Walk always in a well-lit room (use night lights in the walls). Avoid area rugs or power cords from appliances in the middle of the walkways. Use a walker or a cane if necessary and consider physical therapy for balance exercise. Get your eyesight checked regularly. ? ?FINANCIAL OVERSIGHT: Supervision, especially oversight when making financial decisions or transactions is also recommended. ? ?HOME SAFETY: Consider  the safety of the kitchen when operating appliances like stoves, microwave oven, and blender. Consider having supervision and share cooking  responsibilities until no longer able to participate in those. Accidents with firearms and other hazards in the house should be identified and addressed as well. ? ? ?ABILITY TO BE LEFT ALONE: If patient is unable to contact 911 operator, consider using LifeLine, or when the need is there, arrange for someone to stay with patients. Smoking is a fire hazard, consider supervision or cessation. Risk of wandering should be assessed by caregiver and if detected at any point, supervision and safe proof recommendations should be instituted. ? ?MEDICATION SUPERVISION: Inability to self-administer medication needs to be constantly addressed. Implement a mechanism to ensure safe administration of the medications. ? ? ?  ? ?  ? ? ?Mediterranean Diet ?A Mediterranean diet refers to food and lifestyle choices that are based on the traditions of countries located on the [employer]. This way of eating has been shown to help prevent certain conditions and improve outcomes for people who have chronic diseases, like kidney disease and heart disease. ?What are tips for following this plan? ?Lifestyle  ?Cook and eat meals together with your family, when possible. ?Drink enough fluid to keep your urine clear or pale yellow. ?Be physically active every day. This includes: ?Aerobic exercise like running or swimming. ?Leisure activities like gardening, walking, or housework. ?Get 7-8 hours of sleep each night. ?If recommended by your health care provider, drink red wine in moderation. This means 1 glass a day for nonpregnant women and 2 glasses a day for men. A glass of wine equals 5 oz (150 mL). ?Reading food labels  ?Check the serving size of packaged foods. For foods such as rice and pasta, the serving size refers to the amount of cooked product, not dry. ?Check the total fat in packaged foods. Avoid foods that have saturated fat or trans fats. ?Check the ingredients list for added sugars, such as corn syrup. ?Shopping  ?At the  grocery store, buy most of your food from the areas near the walls of the store. This includes: ?Fresh fruits and vegetables (produce). ?Grains, beans, nuts, and seeds. Some of these may be available in unpackaged forms or large amounts (in bulk). ?Fresh seafood. ?Poultry and eggs. ?Low-fat dairy products. ?Buy whole ingredients instead of prepackaged foods. ?Buy fresh fruits and vegetables in-season from local farmers markets. ?Buy frozen fruits and vegetables in resealable bags. ?If you do not have access to quality fresh seafood, buy precooked frozen shrimp or canned fish, such as tuna, salmon, or sardines. ?Buy small amounts of raw or cooked vegetables, salads, or olives from the deli or salad bar at your store. ?Stock your pantry so you always have certain foods on hand, such as olive oil, canned tuna, canned tomatoes, rice, pasta, and beans. ?Cooking  ?Cook foods with extra-virgin olive oil instead of using butter or other vegetable oils. ?Have meat as a side dish, and have vegetables or grains as your main dish. This means having meat in small portions or adding small amounts of meat to foods like pasta or stew. ?Use beans or vegetables instead of meat in common dishes like chili or lasagna. ?Experiment with different cooking methods. Try roasting or broiling vegetables instead of steaming or saut?eing them. ?Add frozen vegetables to soups, stews, pasta, or rice. ?Add nuts or seeds for added healthy fat at each meal. You can add these to yogurt, salads, or vegetable dishes. ?Marinate fish or  vegetables using olive oil, lemon juice, garlic, and fresh herbs. ?Meal planning  ?Plan to eat 1 vegetarian meal one day each week. Try to work up to 2 vegetarian meals, if possible. ?Eat seafood 2 or more times a week. ?Have healthy snacks readily available, such as: ?Vegetable sticks with hummus. ?[place] yogurt. ?Fruit and nut trail mix. ?Eat balanced meals throughout the week. This includes: ?Fruit: 2-3 servings a  day ?Vegetables: 4-5 servings a day ?Low-fat dairy: 2 servings a day ?Fish, poultry, or lean meat: 1 serving a day ?Beans and legumes: 2 or more servings a week ?Nuts and seeds: 1-2 servings a day ?Whole grains: 6-8 serv
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
## 2021-09-30 ENCOUNTER — Encounter: Payer: Self-pay | Admitting: Physician Assistant

## 2021-10-02 ENCOUNTER — Encounter: Payer: Self-pay | Admitting: Internal Medicine

## 2021-10-02 ENCOUNTER — Ambulatory Visit (INDEPENDENT_AMBULATORY_CARE_PROVIDER_SITE_OTHER): Payer: BC Managed Care – PPO | Admitting: Internal Medicine

## 2021-10-02 VITALS — BP 126/80 | HR 86 | Temp 98.2°F | Ht 63.0 in | Wt 186.4 lb

## 2021-10-02 DIAGNOSIS — N182 Chronic kidney disease, stage 2 (mild): Secondary | ICD-10-CM | POA: Diagnosis not present

## 2021-10-02 DIAGNOSIS — M1A3720 Chronic gout due to renal impairment, left ankle and foot, without tophus (tophi): Secondary | ICD-10-CM

## 2021-10-02 DIAGNOSIS — Z6833 Body mass index (BMI) 33.0-33.9, adult: Secondary | ICD-10-CM

## 2021-10-02 DIAGNOSIS — E6609 Other obesity due to excess calories: Secondary | ICD-10-CM

## 2021-10-02 DIAGNOSIS — E7800 Pure hypercholesterolemia, unspecified: Secondary | ICD-10-CM | POA: Diagnosis not present

## 2021-10-02 DIAGNOSIS — E1122 Type 2 diabetes mellitus with diabetic chronic kidney disease: Secondary | ICD-10-CM

## 2021-10-02 DIAGNOSIS — I129 Hypertensive chronic kidney disease with stage 1 through stage 4 chronic kidney disease, or unspecified chronic kidney disease: Secondary | ICD-10-CM

## 2021-10-02 DIAGNOSIS — E538 Deficiency of other specified B group vitamins: Secondary | ICD-10-CM | POA: Diagnosis not present

## 2021-10-02 MED ORDER — PRAVASTATIN SODIUM 40 MG PO TABS: ORAL_TABLET | ORAL | 1 refills | Status: AC

## 2021-10-02 MED ORDER — CYANOCOBALAMIN 1000 MCG/ML IJ SOLN
1000.0000 ug | Freq: Once | INTRAMUSCULAR | Status: AC
  Administered 2021-10-02: 1000 ug via INTRAMUSCULAR

## 2021-10-02 NOTE — Telephone Encounter (Signed)
I do not have any other names.   She can also call [facility] [phone] for guidance on geriatric dementia issues, maybe they have some information thanks

## 2021-10-02 NOTE — Patient Instructions (Signed)

## 2021-10-02 NOTE — Progress Notes (Signed)
Pamela Key,acting as a Education administrator for Pamela Greenland, MD.,have documented all relevant documentation on the behalf of Pamela Greenland, MD,as directed by  Pamela Greenland, MD while in the presence of Pamela Greenland, MD.  This visit occurred during the SARS-CoV-2 public health emergency.  Safety protocols were in place, including screening questions prior to the visit, additional usage of staff PPE, and extensive cleaning of exam room while observing appropriate contact time as indicated for disinfecting solutions.  Subjective:     Patient ID: Pamela Key , female    DOB: 1940-07-31 , 81 y.o.   MRN: [id number]   Chief Complaint  Patient presents with   Diabetes   Hypertension    HPI  She presents today for DM/BP check. She denies having any specific concerns/complaints at this time. She reports compliance with meds.   Unfortunately, it appears she does not have active insurance. I did have billing specialist sit with her today to contact her insurance company. Pt decided she doesn't want to pay premiums. She will discuss further with her children. Her daughter was apprised of insurance situation and she plans to discuss further with the patient.   Diabetes She presents for her follow-up diabetic visit. She has type 2 diabetes mellitus. Her disease course has been stable. There are no hypoglycemic associated symptoms. There are no diabetic associated symptoms. Pertinent negatives for diabetes include no blurred vision, no chest pain, no polydipsia, no polyphagia and no polyuria. There are no hypoglycemic complications. There are no diabetic complications. Risk factors for coronary artery disease include diabetes mellitus, dyslipidemia and hypertension. She is following a generally healthy diet. She participates in exercise intermittently. Her home blood glucose trend is fluctuating minimally. Her breakfast blood glucose is taken between 8-9 am. Her breakfast blood glucose range is  generally 90-110 mg/dl. An ACE inhibitor/angiotensin II receptor blocker is being taken. She does not see a podiatrist.Eye exam is not current.  Hypertension This is a chronic problem. The current episode started more than 1 year ago. The problem has been gradually improving since onset. The problem is controlled. Pertinent negatives include no blurred vision or chest pain. Risk factors for coronary artery disease include diabetes mellitus, dyslipidemia, post-menopausal state, obesity and sedentary lifestyle. Past treatments include ACE inhibitors. The current treatment provides moderate improvement. Compliance problems include exercise.  Hypertensive end-organ damage includes kidney disease.    Past Medical History:  Diagnosis Date   Diabetes mellitus without complication ([place])    Gout    High cholesterol    Hypertension      Family History  Problem Relation Age of Onset   Diabetes Mother    Hypertension Mother    Cancer Father    Hypertension Father    Breast cancer Neg Hx      Current Outpatient Medications:    allopurinol (ZYLOPRIM) 100 MG tablet, Take 1 tablet (100 mg total) by mouth daily., Disp: 90 tablet, Rfl: 1   Blood Glucose Monitoring Suppl (ACCU-CHEK GUIDE) w/Device KIT, Use to check blood sugars twice daily E11.65, Disp: 1 kit, Rfl: 1   donepezil (ARICEPT) 10 MG tablet, Take half tablet (5 mg) daily for 2 weeks, then increase to the full tablet at 10 mg daily, Disp: 30 tablet, Rfl: 11   ferrous sulfate 325 (65 FE) MG tablet, Take 325 mg by mouth See admin instructions. Take one tablet (325 mg) by mouth daily Monday thru Friday (skip Saturday and Sunday), Disp: , Rfl:  glucose blood (ACCU-CHEK GUIDE) test strip, Use to check blood sugars twice daily E11.65, Disp: 100 each, Rfl: 2   glucose blood test strip, Use as instructed to check blood sugars twice daily, Disp: 100 each, Rfl: 2   Misc Natural Products (BLOOD SUGAR BALANCE PO), Take 1 capsule by mouth daily. , Disp: ,  Rfl:    Multiple Vitamin (MULTIVITAMIN WITH MINERALS) TABS tablet, Take 1 tablet by mouth daily with breakfast., Disp: , Rfl:    NON FORMULARY, daily as needed. CBD capsules Pure organic hemp extract, Disp: , Rfl:    valsartan (DIOVAN) 160 MG tablet, Take 1 tablet (160 mg total) by mouth daily., Disp: 90 tablet, Rfl: 1   pravastatin (PRAVACHOL) 40 MG tablet, One tab po M-F and skip Saturday/Sundays, Disp: 75 tablet, Rfl: 1   No Known Allergies   Review of Systems  Constitutional: Negative.   Eyes:  Negative for blurred vision.  Respiratory: Negative.    Cardiovascular: Negative.  Negative for chest pain.  Gastrointestinal: Negative.   Endocrine: Negative for polydipsia, polyphagia and polyuria.  Neurological: Negative.   Psychiatric/Behavioral: Negative.      Today's Vitals   10/02/21 1045  BP: 126/80  Pulse: 86  Temp: 98.2 F (36.8 C)  Weight: 186 lb 6.4 oz (84.6 kg)  Height: 5' 3"  (1.6 m)  PainSc: 0-No pain   Body mass index is 33.02 kg/m.  Wt Readings from Last 3 Encounters:  10/02/21 186 lb 6.4 oz (84.6 kg)  09/12/21 190 lb (86.2 kg)  07/11/21 188 lb (85.3 kg)     Objective:  Physical Exam Vitals and nursing note reviewed.  Constitutional:      Appearance: Normal appearance. She is obese.  HENT:     Head: Normocephalic and atraumatic.  Eyes:     Extraocular Movements: Extraocular movements intact.  Cardiovascular:     Rate and Rhythm: Normal rate and regular rhythm.     Heart sounds: Normal heart sounds.  Pulmonary:     Effort: Pulmonary effort is normal.     Breath sounds: Normal breath sounds.  Musculoskeletal:     Cervical back: Normal range of motion.  Skin:    General: Skin is warm.  Neurological:     General: No focal deficit present.     Mental Status: She is alert.  Psychiatric:        Mood and Affect: Mood normal.        Behavior: Behavior normal.     Assessment And Plan:     1. Type 2 diabetes mellitus with stage 2 chronic kidney disease,  without long-term current use of insulin (HCC) Comments: Chronic, I will check labs as listed below. Importance of dietary compliance was d/w patient.  - Hemoglobin A1c - Lipid panel  2. Hypertensive nephropathy Comments: Chronic, well controlled. She is encouraged to follow a low sodium diet.  - CMP14+EGFR - CBC no Diff  3. Pure hypercholesterolemia Comments: Chronic, LDL goal <70. She will c/w paravastatin 11m M-F. Sh eis encouraged to avoid fried foods.   4. B12 deficiency Comments: I will check vitamin B12 level today. She was also given vitamin B12 IM x 1.  - Vitamin B12 - cyanocobalamin ((VITAMIN B-12)) injection 1,000 mcg  5. 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 She will c/w allopurinol.  - Uric acid  6. Class 1 obesity due to excess calories with serious comorbidity and body  mass index (BMI) of 33.0 to 33.9 in adult  She is encouraged to strive for BMI less than 30 to decrease cardiac risk. Advised to aim for at least 150 minutes of exercise per week.    Patient was given opportunity to ask questions. Patient verbalized understanding of the plan and was able to repeat key elements of the plan. All questions were answered to their satisfaction.   I, Pamela Greenland, MD, have reviewed all documentation for this visit. The documentation on 10/02/21 for the exam, diagnosis, procedures, and orders are all accurate and complete.   IF YOU HAVE BEEN REFERRED TO A SPECIALIST, IT MAY TAKE 1-2 WEEKS TO SCHEDULE/PROCESS THE REFERRAL. IF YOU HAVE NOT HEARD FROM US/SPECIALIST IN TWO WEEKS, PLEASE GIVE [place] A CALL AT [phone].   THE PATIENT IS ENCOURAGED TO PRACTICE SOCIAL DISTANCING DUE TO THE COVID-19 PANDEMIC.

## 2021-10-03 ENCOUNTER — Ambulatory Visit (INDEPENDENT_AMBULATORY_CARE_PROVIDER_SITE_OTHER): Payer: BC Managed Care – PPO

## 2021-10-03 DIAGNOSIS — E1122 Type 2 diabetes mellitus with diabetic chronic kidney disease: Secondary | ICD-10-CM

## 2021-10-03 DIAGNOSIS — R413 Other amnesia: Secondary | ICD-10-CM

## 2021-10-03 DIAGNOSIS — I129 Hypertensive chronic kidney disease with stage 1 through stage 4 chronic kidney disease, or unspecified chronic kidney disease: Secondary | ICD-10-CM

## 2021-10-03 LAB — CBC
Hematocrit: 38.5 % (ref 34.0–46.6)
Hemoglobin: 12.9 g/dL (ref 11.1–15.9)
MCH: 30.9 pg (ref 26.6–33.0)
MCHC: 33.5 g/dL (ref 31.5–35.7)
MCV: 92 fL (ref 79–97)
Platelets: 188 10*3/uL (ref 150–450)
RBC: 4.17 x10E6/uL (ref 3.77–5.28)
RDW: 13.7 % (ref 11.7–15.4)
WBC: 3.9 10*3/uL (ref 3.4–10.8)

## 2021-10-03 LAB — CMP14+EGFR
ALT: 18 IU/L (ref 0–32)
AST: 28 IU/L (ref 0–40)
Albumin/Globulin Ratio: 1.5 (ref 1.2–2.2)
Albumin: 4.4 g/dL (ref 3.7–4.7)
Alkaline Phosphatase: 63 IU/L (ref 44–121)
BUN/Creatinine Ratio: 13 (ref 12–28)
BUN: 10 mg/dL (ref 8–27)
Bilirubin Total: 0.3 mg/dL (ref 0.0–1.2)
CO2: 24 mmol/L (ref 20–29)
Calcium: 9.3 mg/dL (ref 8.7–10.3)
Chloride: 105 mmol/L (ref 96–106)
Creatinine, Ser: 0.78 mg/dL (ref 0.57–1.00)
Globulin, Total: 2.9 g/dL (ref 1.5–4.5)
Glucose: 96 mg/dL (ref 70–99)
Potassium: 4.3 mmol/L (ref 3.5–5.2)
Sodium: 141 mmol/L (ref 134–144)
Total Protein: 7.3 g/dL (ref 6.0–8.5)
eGFR: 78 mL/min/{1.73_m2} (ref >59)

## 2021-10-03 LAB — LIPID PANEL
Chol/HDL Ratio: 3.1 ratio (ref 0.0–4.4)
Cholesterol, Total: 191 mg/dL (ref 100–199)
HDL: 61 mg/dL (ref >39)
LDL Chol Calc (NIH): 114 mg/dL — ABNORMAL HIGH (ref 0–99)
Triglycerides: 88 mg/dL (ref 0–149)
VLDL Cholesterol Cal: 16 mg/dL (ref 5–40)

## 2021-10-03 LAB — VITAMIN B12: Vitamin B-12: 1029 pg/mL (ref 232–1245)

## 2021-10-03 LAB — HEMOGLOBIN A1C
Est. average glucose Bld gHb Est-mCnc: 131 mg/dL
Hgb A1c MFr Bld: 6.2 % — ABNORMAL HIGH (ref 4.8–5.6)

## 2021-10-03 LAB — URIC ACID: Uric Acid: 5.1 mg/dL (ref 3.1–7.9)

## 2021-10-03 NOTE — Chronic Care Management (AMB) (Signed)
Chronic Care Management    Social Work Note  10/03/2021 Name: Pamela Key MRN: [id number] DOB: [birth year]  Pamela Key is a 81 y.o. year old female who is a primary care patient of Glendale Chard, MD. The CCM team was consulted to assist the patient with chronic disease management and/or care coordination needs related to:  DM II, Hypertensive Nephropathy, Mild Late Onset Alzheimer's Dementia .   Engaged with patients son by phone  for follow up visit in response to provider referral for social work chronic care management and care coordination services.   Consent to Services:  The patient was given information about Chronic Care Management services, agreed to services, and gave verbal consent prior to initiation of services.  Please see initial visit note for detailed documentation.   Patient agreed to services and consent obtained.   Assessment: Review of patient past medical history, allergies, medications, and health status, including review of relevant consultants reports was performed today as part of a comprehensive evaluation and provision of chronic care management and care coordination services.     SDOH (Social Determinants of Health) assessments and interventions performed:    Advanced Directives Status: Not addressed in this encounter.  CCM Care Plan  No Known Allergies  Outpatient Encounter Medications as of 10/03/2021  Medication Sig   allopurinol (ZYLOPRIM) 100 MG tablet Take 1 tablet (100 mg total) by mouth daily.   Blood Glucose Monitoring Suppl (ACCU-CHEK GUIDE) w/Device KIT Use to check blood sugars twice daily E11.65   donepezil (ARICEPT) 10 MG tablet Take half tablet (5 mg) daily for 2 weeks, then increase to the full tablet at 10 mg daily   ferrous sulfate 325 (65 FE) MG tablet Take 325 mg by mouth See admin instructions. Take one tablet (325 mg) by mouth daily Monday thru Friday (skip Saturday and Sunday)   glucose blood (ACCU-CHEK GUIDE) test strip  Use to check blood sugars twice daily E11.65   glucose blood test strip Use as instructed to check blood sugars twice daily   Misc Natural Products (BLOOD SUGAR BALANCE PO) Take 1 capsule by mouth daily.    Multiple Vitamin (MULTIVITAMIN WITH MINERALS) TABS tablet Take 1 tablet by mouth daily with breakfast.   NON FORMULARY daily as needed. CBD capsules Pure organic hemp extract   pravastatin (PRAVACHOL) 40 MG tablet One tab po M-F and skip Saturday/Sundays   valsartan (DIOVAN) 160 MG tablet Take 1 tablet (160 mg total) by mouth daily.   No facility-administered encounter medications on file as of 10/03/2021.    Patient Active Problem List   Diagnosis Date Noted   Pure hypercholesterolemia 06/17/2018   Type 2 diabetes mellitus with stage 2 chronic kidney disease, without long-term current use of insulin ([place])    Hypertensive nephropathy 03/01/2018    Conditions to be addressed/monitored:  DM II, Hypertensive Nephropathy, Mild Late Onset Alzheimer's Dementia ; Cognitive Deficits  Care Plan : Social Work [facility] Care Plan  Updates made by [name] since 10/03/2021 12:00 AM     Problem: Safety - family concerned pt has dementia      Long-Range Goal: Home and Family Safety Maintained   Start Date: 09/29/2020  This Visit's Progress: On track  Recent Progress: On track  Priority: High  Note:   Current Barriers:  Chronic disease management support and education needs related to HTN and DM  Limited support system Cognitive Decline  Social Worker Clinical Goal(s):  Over the next 90 days the patient and her  daughter will work with SW to identify resources to assist with patient care needs Over the next 10 days the patient and her children will follow up on status of Medicaid application as directed by SW -Goal Met Over the next 30 days the patient will schedule an appointment with neurology -Goal Met SW Interventions:  Inter-disciplinary care team collaboration (see longitudinal  plan of care) Collaboration with Glendale Chard, MD regarding development and update of comprehensive plan of care as evidenced by provider attestation and co-signature Telephonic visit completed with the patients son [name] [place] to assess goal progression Discussed the patient is doing well in the home and is receiving caregiver assistance under the [facility] in home aid program Mr. Paola reports the patient is continuing to make decisions that are not in her best interest such as un-enrolling from Medicare and withdrawing money from her bank account and not remembering where she places it at home Determined these concerns have been discussed with the patients neurologist and the patient has been referred to a provider to assess patients decision making capacity Discussed plans for the patient and her family to contact SW as needed Scheduled follow up call over the next 90 days  Patient Goals/Self-Care Activities patient will: with the help of her family -Complete Decision Making Assessment -Contact SW as needed prior to next scheduled call  Follow Up Plan:  SW will follow up with the patient over the next 90 days       Follow Up Plan: SW will follow up with patient by phone over the next 90 days.      [name], BSW, CDP Social Worker, Certified Dementia Practitioner [place] [facility] [place] [facility] [phone]

## 2021-10-03 NOTE — Patient Instructions (Signed)
Social Worker Visit Information  Goals we discussed today:   Goals Addressed             This Visit's Progress    Home and Family Safety Maintained   On track    Timeframe:  Long-Range Goal Priority:  High Start Date:  5.19.22                                          Next planned outreach: 8.23.23  Patient Goals/Self-Care Activities patient will: with the help of her family -Complete Decision Making Assessment -Contact SW as needed prior to next scheduled call          Patient verbalizes understanding of instructions and care plan provided today and agrees to view in MyChart. Active MyChart status and patient understanding of how to access instructions and care plan via MyChart confirmed with patient.     Follow Up Plan: SW will follow up with patient by phone over the next 90 days   [name], BSW, CDP Social Worker, Certified Dementia Practitioner [facility] Care Management [phone]

## 2021-10-05 ENCOUNTER — Ambulatory Visit: Payer: Medicare Other | Admitting: Sports Medicine

## 2021-10-06 ENCOUNTER — Encounter: Payer: Self-pay | Admitting: Internal Medicine

## 2021-10-06 ENCOUNTER — Other Ambulatory Visit: Payer: Self-pay | Admitting: Internal Medicine

## 2021-10-06 DIAGNOSIS — R413 Other amnesia: Secondary | ICD-10-CM

## 2021-10-11 ENCOUNTER — Telehealth: Payer: Self-pay

## 2021-10-11 ENCOUNTER — Telehealth: Payer: Medicare Other

## 2021-10-11 DIAGNOSIS — N182 Chronic kidney disease, stage 2 (mild): Secondary | ICD-10-CM

## 2021-10-11 DIAGNOSIS — I129 Hypertensive chronic kidney disease with stage 1 through stage 4 chronic kidney disease, or unspecified chronic kidney disease: Secondary | ICD-10-CM

## 2021-10-11 DIAGNOSIS — E1122 Type 2 diabetes mellitus with diabetic chronic kidney disease: Secondary | ICD-10-CM

## 2021-10-11 NOTE — Telephone Encounter (Signed)
  Care Management   Follow Up Note   10/11/2021 Name: Pamela Key MRN: [id number] DOB: 06-25-1940   Referred by: [name], MD Reason for referral : Chronic Care Management (RN CM Follow up call )   An unsuccessful telephone outreach was attempted today. The patient was referred to the case management team for assistance with care management and care coordination.   Follow Up Plan: Telephone follow up appointment with care management team member scheduled for: 10/24/21  Delsa Sale, RN, BSN, CCM Care Management Coordinator [facility] Care Management/[facility]  Direct Phone: [phone]

## 2021-10-24 ENCOUNTER — Telehealth: Payer: Self-pay

## 2021-10-24 ENCOUNTER — Telehealth: Payer: Medicare Other

## 2021-10-24 NOTE — Telephone Encounter (Signed)
  Care Management   Follow Up Note   10/24/2021 Name: Pamela Key MRN: [id number] DOB: [birth year]   Referred by: [name], MD Reason for referral : Chronic Care Management (RN CM Follow up call #2)   A second unsuccessful telephone outreach was attempted today. The patient was referred to the case management team for assistance with care management and care coordination.   Follow Up Plan: A HIPPA compliant phone message was left for the patient providing contact information and requesting a return call.   Delsa Sale, RN, BSN, CCM Care Management Coordinator [facility] Care Management/[facility]  Direct Phone: [phone]

## 2021-11-07 ENCOUNTER — Telehealth: Payer: Medicare Other

## 2021-11-07 ENCOUNTER — Ambulatory Visit: Payer: Self-pay

## 2021-11-07 DIAGNOSIS — R413 Other amnesia: Secondary | ICD-10-CM

## 2021-11-07 DIAGNOSIS — N182 Chronic kidney disease, stage 2 (mild): Secondary | ICD-10-CM

## 2021-11-07 DIAGNOSIS — E538 Deficiency of other specified B group vitamins: Secondary | ICD-10-CM

## 2021-11-09 ENCOUNTER — Other Ambulatory Visit (INDEPENDENT_AMBULATORY_CARE_PROVIDER_SITE_OTHER): Payer: Medicare Other | Admitting: Internal Medicine

## 2021-11-09 DIAGNOSIS — Z9181 History of falling: Secondary | ICD-10-CM

## 2021-11-09 DIAGNOSIS — I129 Hypertensive chronic kidney disease with stage 1 through stage 4 chronic kidney disease, or unspecified chronic kidney disease: Secondary | ICD-10-CM | POA: Diagnosis not present

## 2021-11-09 DIAGNOSIS — N182 Chronic kidney disease, stage 2 (mild): Secondary | ICD-10-CM

## 2021-11-09 DIAGNOSIS — F02A18 Dementia in other diseases classified elsewhere, mild, with other behavioral disturbance: Secondary | ICD-10-CM

## 2021-11-09 DIAGNOSIS — G301 Alzheimer's disease with late onset: Secondary | ICD-10-CM | POA: Diagnosis not present

## 2021-11-09 DIAGNOSIS — E1122 Type 2 diabetes mellitus with diabetic chronic kidney disease: Secondary | ICD-10-CM | POA: Diagnosis not present

## 2021-11-09 DIAGNOSIS — E7800 Pure hypercholesterolemia, unspecified: Secondary | ICD-10-CM

## 2021-11-09 DIAGNOSIS — F0280 Dementia in other diseases classified elsewhere without behavioral disturbance: Secondary | ICD-10-CM

## 2021-11-09 NOTE — Progress Notes (Signed)
CC: HH Certification  Received home health orders orders from [facility]. Start of care 10/14/21.   Certification and orders from 10/14/21 through 12/12/21 are reviewed, signed and faxed back to home health company.  Need of intermittent skilled services at home: Skilled nursing  The home health care plan has been established by me and will be reviewed and updated as needed to maximize patient recovery.  I certify that all home health services have been and will be furnished to the patient while under my care.  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

## 2022-01-03 ENCOUNTER — Ambulatory Visit: Payer: Medicare Other

## 2022-01-03 DIAGNOSIS — E1122 Type 2 diabetes mellitus with diabetic chronic kidney disease: Secondary | ICD-10-CM

## 2022-01-03 DIAGNOSIS — F0280 Dementia in other diseases classified elsewhere without behavioral disturbance: Secondary | ICD-10-CM

## 2022-01-03 NOTE — Patient Instructions (Signed)
Visit Information  Thank you for taking time to visit with me today. Please don't hesitate to contact me if I can be of assistance to you before our next scheduled telephone appointment.  Following are the goals we discussed today:  Patient Goals/Self-Care Activities patient will: with the help of her family -Contact patients primary care provider as needed  If you are experiencing a Mental Health or Behavioral Health Crisis or need someone to talk to, please go to [facility] [address], [place] [phone])   Patient verbalizes understanding of instructions and care plan provided today and agrees to view in MyChart. Active MyChart status and patient understanding of how to access instructions and care plan via MyChart confirmed with patient.     No further follow up required: Please contact Dr. Allyne Gee as needed.  [name], BSW, CDP Social Worker, Certified Dementia Practitioner Care Coordination [phone]

## 2022-01-03 NOTE — Chronic Care Management (AMB) (Signed)
Social Work Note  01/03/2022 Name: Pamela Key MRN: [id number] DOB: [birth year]  Pamela Key is a 81 y.o. year old female who is a primary care patient of Pamela Chard, MD.  The Care Management team was consulted for assistance with chronic disease management and care coordination needs.  Pamela Key was given information about Care Management services today including:  Care Management services include personalized support from designated clinical staff supervised by her physician, including individualized plan of care and coordination with other care providers 24/7 contact phone numbers for assistance for urgent and routine care needs. The patient may stop care management services at any time (effective at the end of the month) by phone call to the office staff.  Patient agreed to services and consent obtained.   Engaged with patients son Pamela Key by phone  for follow up visit in response to provider referral for social work chronic care management and care coordination services.  Assessment: Review of patient past medical history, allergies, medications, and health status, including review of pertinent consultant reports was performed as part of comprehensive evaluation and provision of care management/care coordination services.   SDOH (Social Determinants of Health) assessments and interventions performed:  No    Advanced Directives Status: Not addressed in this encounter.  Care Plan  No Known Allergies  Outpatient Encounter Medications as of 01/03/2022  Medication Sig   allopurinol (ZYLOPRIM) 100 MG tablet Take 1 tablet (100 mg total) by mouth daily.   Blood Glucose Monitoring Suppl (ACCU-CHEK GUIDE) w/Device KIT Use to check blood sugars twice daily E11.65   donepezil (ARICEPT) 10 MG tablet Take half tablet (5 mg) daily for 2 weeks, then increase to the full tablet at 10 mg daily   ferrous sulfate 325 (65 FE) MG tablet Take 325 mg by mouth See admin  instructions. Take one tablet (325 mg) by mouth daily Monday thru Friday (skip Saturday and Sunday)   glucose blood (ACCU-CHEK GUIDE) test strip Use to check blood sugars twice daily E11.65   glucose blood test strip Use as instructed to check blood sugars twice daily   Misc Natural Products (BLOOD SUGAR BALANCE PO) Take 1 capsule by mouth daily.    Multiple Vitamin (MULTIVITAMIN WITH MINERALS) TABS tablet Take 1 tablet by mouth daily with breakfast.   NON FORMULARY daily as needed. CBD capsules Pure organic hemp extract   pravastatin (PRAVACHOL) 40 MG tablet One tab po M-F and skip Saturday/Sundays   valsartan (DIOVAN) 160 MG tablet Take 1 tablet (160 mg total) by mouth daily.   No facility-administered encounter medications on file as of 01/03/2022.    Patient Active Problem List   Diagnosis Date Noted   Pure hypercholesterolemia 06/17/2018   Type 2 diabetes mellitus with stage 2 chronic kidney disease, without long-term current use of insulin ([place])    Hypertensive nephropathy 03/01/2018    Conditions to be addressed/monitored: DMII and Dementia; Memory Deficits  Care Plan : Social Work [place]  Updates made by Pamela Key since 01/03/2022 12:00 AM  Completed 01/03/2022   Problem: Safety - family concerned pt has dementia Resolved 01/03/2022     Long-Range Goal: Home and Family Safety Maintained Completed 01/03/2022  Start Date: 09/29/2020  Recent Progress: On track  Priority: High  Note:   Current Barriers:  Chronic disease management support and education needs related to HTN and DM  Limited support system Cognitive Decline  Social Worker Clinical Goal(s):  Over the next 90 days the patient  and her daughter will work with SW to identify resources to assist with patient care needs Over the next 10 days the patient and her children will follow up on status of Medicaid application as directed by SW -Goal Met Over the next 30 days the patient will schedule an appointment  with neurology -Goal Met SW Interventions:  Inter-disciplinary care team collaboration (see longitudinal plan of care) Collaboration with Pamela Chard, MD regarding development and update of comprehensive plan of care as evidenced by provider attestation and co-signature Telephonic visit completed with the patients son Pamela Key [place] to assess goal progression Discussed the patient is doing well in the home and has begun receiving meals on wheels Encouraged Pamela Key to contact patients primary care provider as needed Goal closed  Patient Goals/Self-Care Activities patient will: with the help of her family -Contact patients primary care provider as needed      Care Plan : [place] of Care  Updates made by Pamela Key since 01/03/2022 12:00 AM  Completed 01/03/2022   Problem: No plan of care established for management of chronic disease states (Type 2 diabetes, Hypertensive nephropathy, Memory deficit, Vitamin B 12 deficiency) Resolved 01/03/2022  Priority: High     Long-Range Goal: Development of plan of care for chronic disease management (Type 2 diabetes, Hypertensive nephropathy, Memory deficit, Vitamin B 12 deficiency) Completed 01/03/2022  Start Date: 04/13/2021  Expected End Date: 04/13/2022  Recent Progress: On track  Priority: High  Note:   Current Barriers:  Knowledge Deficits related to plan of care for management of Type 2 diabetes, Hypertensive nephropathy, Memory deficit, Vitamin B 12 deficiency   Chronic Disease Management support and education needs related to Type 2 diabetes, Hypertensive nephropathy, Memory deficit, Vitamin B 12 deficiency   Cognitive Deficits  RNCM Clinical Goal(s):  Patient will verbalize basic understanding of  Type 2 diabetes, Hypertensive nephropathy, Memory deficit, Vitamin B 12 deficiency  disease process and self health management plan as evidenced by patient will experience no disease exacerbations related to the disease states  listed above take all medications exactly as prescribed and will call provider for medication related questions as evidenced by patient/caregiver will establish a plan of care to improve patient's medication adherence continue to work with RN Care Manager to address care management and care coordination needs related to  Type 2 diabetes, Hypertensive nephropathy, Memory deficit, Vitamin B 12 deficiency  as evidenced by adherence to CM Team Scheduled appointments demonstrate ongoing self health care management ability   as evidenced by    through collaboration with RN Care manager, provider, and care team.   Interventions: 1:1 collaboration with primary care provider regarding development and update of comprehensive plan of care as evidenced by provider attestation and co-signature Inter-disciplinary care team collaboration (see longitudinal plan of care) Evaluation of current treatment plan related to  self management and patient's adherence to plan as established by provider  Diabetes Interventions:  (Status:  Goal on track:  Yes.) Long Term Goal Assessed patient's understanding of A1c goal:  <5.7 % Provided education to patient about basic DM disease process Provided patient with written educational materials related to hypo and hyperglycemia and importance of correct treatment Advised patient, providing education and rationale, to check cbg daily before breakfast and record, calling PCP for findings outside established parameters Review of patient status, including review of consultants reports, relevant laboratory and other test results, and medications completed Assessed social determinant of health barriers Educated patient on dietary and exercise recommendations; daily  glycemic control FBS 80-130, <180 after meals, patient admits she does not eat breakfast, she eats 2 meals per day with an occasional snack Educated patient on the dangers of skipping meals, and may increase her blood sugars  and cause increase her A1c, re-educated patient regarding the plate method and choosing low carb healthy meals/snacks Determined patient is exercising several times a week at the [employer] printed educational materials related Mediterranean diet, Chair Exercises and the Dangers in Skipping Meals Reviewed scheduled/upcoming provider appointments including: next PCP follow up appointment scheduled for 10/02/21 @11 :40 AM; Dr. Cannon Kettle for foot care scheduled for 10/05/21 @10 :15 AM Discussed plans with patient for ongoing care management follow up and provided patient with direct contact information for care management team Lab Results  Component Value Date   HGBA1C 6.4 (H) 06/21/2021  Dementia Interventions:  (Status:  Goal on track:  Yes.)  Long Term Goal Evaluation of current treatment plan related to Dementia, self-management and patient's adherence to plan as established by provider Assessed for disease progression, patient reports she feels her memory loss has not worsened Determined patient's grandson continues to live with her and is helping monitor for changes in her memory Discussed patient currently reports no home safety concerns at this time, she does not drive, she attends the [facility] several times a week in which this center provides transportation and she is getting social interaction and exercise  Determined patient is following a meal schedule and admits she only eats 2 meals per day and has an occasional snack Determined patient continues to self administer her medications, she is using a pill box and has established a daily routine, she reports having no missed doses of her medications  Reviewed scheduled/upcoming provider appointment including: next Neuro follow up is scheduled for 09/12/21 @11 :00 AM Discussed plans with patient for ongoing care management follow up and provided patient with direct contact information for care management team  Hypertension Interventions:   (Status:  Goal on track:  Yes.) Long Term Goal Last practice recorded BP readings:  BP Readings from Last 3 Encounters:  06/21/21 (!) 180/80  06/21/21 (!) 166/60  04/11/21 120/70  Most recent eGFR/CrCl:  Lab Results  Component Value Date   EGFR 70 06/21/2021    No components found for: CRCL Evaluation of current treatment plan related to hypertension self management and patient's adherence to plan as established by provider Reviewed medications with patient and discussed importance of compliance Counseled on the importance of exercise goals with target of 150 minutes per week Advised patient, providing education and rationale, to monitor blood pressure daily and record, calling PCP for findings outside established parameters Determined patient plans to purchase a new BP cuff, instructed patient on type of cuff to purchase and encouraged her to bring the cuff into the next office visit to instruct patient on usage and check for accuracy Mailed printed educational materials related to how to accurately monitor her BP at home  Discussed plans with patient for ongoing care management follow up and provided patient with direct contact information for care management team  Patient Goals/Self-Care Activities: Take all medications as prescribed Attend all scheduled provider appointments Call pharmacy for medication refills 3-7 days in advance of running out of medications Call provider office for new concerns or questions  Work with the social worker to address care coordination needs and will continue to work with the clinical team to address health care and disease management related needs drink 6 to 8 glasses of  water each day fill half of plate with vegetables manage portion size check blood pressure 3 times per week take medications for blood pressure exactly as prescribed  Follow Up Plan:  Telephone follow up appointment with care management team member scheduled for:  10/11/21        Follow Up Plan:  No follow up planned at this time. Please contact Dr. Baird Cancer as needed.      Pamela Key, BSW, CDP Social Worker, Certified Dementia Practitioner Care Coordination [phone]

## 2022-01-03 NOTE — Progress Notes (Signed)
This encounter was created in error - please disregard.

## 2022-02-05 ENCOUNTER — Ambulatory Visit: Payer: Medicare Other | Admitting: Internal Medicine

## 2022-03-12 ENCOUNTER — Telehealth: Payer: Self-pay

## 2022-03-12 ENCOUNTER — Ambulatory Visit (INDEPENDENT_AMBULATORY_CARE_PROVIDER_SITE_OTHER): Payer: BC Managed Care – PPO | Admitting: Internal Medicine

## 2022-03-12 ENCOUNTER — Encounter: Payer: Self-pay | Admitting: Internal Medicine

## 2022-03-12 VITALS — BP 180/82 | HR 59 | Temp 98.5°F | Ht 61.8 in | Wt 181.6 lb

## 2022-03-12 DIAGNOSIS — F0280 Dementia in other diseases classified elsewhere without behavioral disturbance: Secondary | ICD-10-CM

## 2022-03-12 DIAGNOSIS — Z6833 Body mass index (BMI) 33.0-33.9, adult: Secondary | ICD-10-CM

## 2022-03-12 DIAGNOSIS — M1A3720 Chronic gout due to renal impairment, left ankle and foot, without tophus (tophi): Secondary | ICD-10-CM | POA: Diagnosis not present

## 2022-03-12 DIAGNOSIS — I129 Hypertensive chronic kidney disease with stage 1 through stage 4 chronic kidney disease, or unspecified chronic kidney disease: Secondary | ICD-10-CM

## 2022-03-12 DIAGNOSIS — N182 Chronic kidney disease, stage 2 (mild): Secondary | ICD-10-CM

## 2022-03-12 DIAGNOSIS — E6609 Other obesity due to excess calories: Secondary | ICD-10-CM

## 2022-03-12 DIAGNOSIS — Z23 Encounter for immunization: Secondary | ICD-10-CM

## 2022-03-12 DIAGNOSIS — E1122 Type 2 diabetes mellitus with diabetic chronic kidney disease: Secondary | ICD-10-CM

## 2022-03-12 DIAGNOSIS — G301 Alzheimer's disease with late onset: Secondary | ICD-10-CM

## 2022-03-12 LAB — POCT URINALYSIS DIPSTICK
Bilirubin, UA: NEGATIVE
Glucose, UA: NEGATIVE
Ketones, UA: NEGATIVE
Leukocytes, UA: NEGATIVE
Nitrite, UA: NEGATIVE
Protein, UA: NEGATIVE
Spec Grav, UA: >=1.03 — AB (ref 1.010–1.025)
Urobilinogen, UA: 0.2 E.U./dL
pH, UA: 5.5 (ref 5.0–8.0)

## 2022-03-12 MED ORDER — VALSARTAN 160 MG PO TABS: 160.0000 mg | ORAL_TABLET | Freq: Every day | ORAL | 1 refills | Status: AC

## 2022-03-12 NOTE — Telephone Encounter (Addendum)
Spoke with Pamela Key, remote health. She reports she did speak with [name]. Patient had too many cancellations previously. Remote health states they will not go back out to patients home due to too many inconveniences.

## 2022-03-12 NOTE — Progress Notes (Deleted)
[name],acting as a Education administrator for [name], MD.,have documented all relevant documentation on the behalf of [name], MD,as directed by  [name], MD while in the presence of [name], MD.    Subjective:     Patient ID: Pamela Key , female    DOB: 1941-05-08 , 81 y.o.   MRN: [id number]   Chief Complaint  Patient presents with   Diabetes    HPI  She presents today for DM/BP check. Patient reports compliance with her meds. Patient also reports having some numbness in her toes.   Diabetes She presents for her follow-up diabetic visit. She has type 2 diabetes mellitus. Her disease course has been stable. There are no hypoglycemic associated symptoms. There are no diabetic associated symptoms. Pertinent negatives for diabetes include no blurred vision, no chest pain, no polydipsia, no polyphagia and no polyuria. There are no hypoglycemic complications. There are no diabetic complications. Risk factors for coronary artery disease include diabetes mellitus, dyslipidemia and hypertension. She is following a generally healthy diet. She participates in exercise intermittently. Her home blood glucose trend is fluctuating minimally. Her breakfast blood glucose is taken between 8-9 am. Her breakfast blood glucose range is generally 90-110 mg/dl. An ACE inhibitor/angiotensin II receptor blocker is being taken. She does not see a podiatrist.Eye exam is not current.  Hypertension This is a chronic problem. The current episode started more than 1 year ago. The problem has been gradually improving since onset. The problem is controlled. Pertinent negatives include no blurred vision or chest pain. Risk factors for coronary artery disease include diabetes mellitus, dyslipidemia, post-menopausal state, obesity and sedentary lifestyle. Past treatments include ACE inhibitors. The current treatment provides moderate improvement. Compliance problems include exercise.   Hypertensive end-organ damage includes kidney disease.     Past Medical History:  Diagnosis Date   Diabetes mellitus without complication ([place])    Gout    High cholesterol    Hypertension      Family History  Problem Relation Age of Onset   Diabetes Mother    Hypertension Mother    Cancer Father    Hypertension Father    Breast cancer Neg Hx      Current Outpatient Medications:    allopurinol (ZYLOPRIM) 100 MG tablet, Take 1 tablet (100 mg total) by mouth daily., Disp: 90 tablet, Rfl: 1   Blood Glucose Monitoring Suppl (ACCU-CHEK GUIDE) w/Device KIT, Use to check blood sugars twice daily E11.65, Disp: 1 kit, Rfl: 1   donepezil (ARICEPT) 10 MG tablet, Take half tablet (5 mg) daily for 2 weeks, then increase to the full tablet at 10 mg daily, Disp: 30 tablet, Rfl: 11   ferrous sulfate 325 (65 FE) MG tablet, Take 325 mg by mouth See admin instructions. Take one tablet (325 mg) by mouth daily Monday thru Friday (skip Saturday and Sunday), Disp: , Rfl:    glucose blood (ACCU-CHEK GUIDE) test strip, Use to check blood sugars twice daily E11.65, Disp: 100 each, Rfl: 2   glucose blood test strip, Use as instructed to check blood sugars twice daily, Disp: 100 each, Rfl: 2   Misc Natural Products (BLOOD SUGAR BALANCE PO), Take 1 capsule by mouth daily. , Disp: , Rfl:    Multiple Vitamin (MULTIVITAMIN WITH MINERALS) TABS tablet, Take 1 tablet by mouth daily with breakfast., Disp: , Rfl:    NON FORMULARY, daily as needed. CBD capsules Pure organic hemp extract, Disp: , Rfl:  pravastatin (PRAVACHOL) 40 MG tablet, One tab po M-F and skip Saturday/Sundays, Disp: 75 tablet, Rfl: 1   valsartan (DIOVAN) 160 MG tablet, Take 1 tablet (160 mg total) by mouth daily., Disp: 90 tablet, Rfl: 1   No Known Allergies   Review of Systems  Constitutional: Negative.   Eyes: Negative.  Negative for blurred vision.  Cardiovascular:  Negative for chest pain.  Endocrine: Negative for polydipsia, polyphagia  and polyuria.  Musculoskeletal: Negative.   Skin: Negative.   Psychiatric/Behavioral: Negative.       Today's Vitals   03/12/22 1409  Temp: 98.5 F (36.9 C)  Weight: 181 lb 9.6 oz (82.4 kg)  Height: 5' 1.8" (1.57 m)  PainSc: 0-No pain   Body mass index is 33.43 kg/m.  Wt Readings from Last 3 Encounters:  03/12/22 181 lb 9.6 oz (82.4 kg)  10/02/21 186 lb 6.4 oz (84.6 kg)  09/12/21 190 lb (86.2 kg)     Objective:  Physical Exam      Assessment And Plan:     1. Type 2 diabetes mellitus with stage 2 chronic kidney disease, without long-term current use of insulin ([facility])  2. Parenchymal renal hypertension, stage 1 through stage 4 or unspecified chronic kidney disease     Patient was given opportunity to ask questions. Patient verbalized understanding of the plan and was able to repeat key elements of the plan. All questions were answered to their satisfaction.  Sheppard Evens [place], CMA   I, [place], CMA, have reviewed all documentation for this visit. The documentation on 03/12/22 for the exam, diagnosis, procedures, and orders are all accurate and complete.   IF YOU HAVE BEEN REFERRED TO A SPECIALIST, IT MAY TAKE 1-2 WEEKS TO SCHEDULE/PROCESS THE REFERRAL. IF YOU HAVE NOT HEARD FROM US/SPECIALIST IN TWO WEEKS, PLEASE GIVE [place] A CALL AT [phone].   THE PATIENT IS ENCOURAGED TO PRACTICE SOCIAL DISTANCING DUE TO THE COVID-19 PANDEMIC.

## 2022-03-12 NOTE — Progress Notes (Signed)
  Subjective:     Patient ID: Pamela Key , female    DOB: 11/27/1940 , 81 y.o.   MRN: [id number]   Chief Complaint  Patient presents with   Diabetes   Hypertension    HPI  She is here today for DM/HTN check. She was previously referred to [facility] due to difficulty with transportation and non-compliance with office visits. They were contacted today prior to her visit, and they were never able to make contact with the patient. Unfortunately, she never answered the phone.   I called her son, Pamela Key during the visit as requested. He was contacted at [phone]. We spent about ten minutes reviewing her meds and her acute needs. He is now aware that Dr. Asenso's team has seen her at least twice.  She will now continue under their care. She agrees to have labwork today, we will mail her a copy so she can show to her new medical team. All questions were answered to her and his satisfaction.   Diabetes She presents for her follow-up diabetic visit. She has type 2 diabetes mellitus. Her disease course has been stable. There are no hypoglycemic associated symptoms. There are no diabetic associated symptoms. Pertinent negatives for diabetes include no blurred vision, no chest pain, no polydipsia, no polyphagia and no polyuria. There are no hypoglycemic complications. There are no diabetic complications. Risk factors for coronary artery disease include diabetes mellitus, dyslipidemia and hypertension. She is following a generally healthy diet. She participates in exercise intermittently. Her home blood glucose trend is fluctuating minimally. Her breakfast blood glucose is taken between 8-9 am. Her breakfast blood glucose range is generally 90-110 mg/dl. An ACE inhibitor/angiotensin II receptor blocker is being taken. She does not see a podiatrist.Eye exam is not current.  Hypertension This is a chronic problem. The current episode started more than 1 year ago. The problem has been gradually  improving since onset. The problem is controlled. Pertinent negatives include no blurred vision or chest pain. Risk factors for coronary artery disease include diabetes mellitus, dyslipidemia, post-menopausal state, obesity and sedentary lifestyle. Past treatments include ACE inhibitors. The current treatment provides moderate improvement. Compliance problems include exercise.  Hypertensive end-organ damage includes kidney disease.     Past Medical History:  Diagnosis Date   Diabetes mellitus without complication (HCC)    Gout    High cholesterol    Hypertension      Family History  Problem Relation Age of Onset   Diabetes Mother    Hypertension Mother    Cancer Father    Hypertension Father    Breast cancer Neg Hx      Current Outpatient Medications:    allopurinol (ZYLOPRIM) 100 MG tablet, Take 1 tablet (100 mg total) by mouth daily., Disp: 90 tablet, Rfl: 1   Blood Glucose Monitoring Suppl (ACCU-CHEK GUIDE) w/Device KIT, Use to check blood sugars twice daily E11.65, Disp: 1 kit, Rfl: 1   donepezil (ARICEPT) 10 MG tablet, Take half tablet (5 mg) daily for 2 weeks, then increase to the full tablet at 10 mg daily, Disp: 30 tablet, Rfl: 11   ferrous sulfate 325 (65 FE) MG tablet, Take 325 mg by mouth See admin instructions. Take one tablet (325 mg) by mouth daily Monday thru Friday (skip Saturday and Sunday), Disp: , Rfl:    glucose blood (ACCU-CHEK GUIDE) test strip, Use to check blood sugars twice daily E11.65, Disp: 100 each, Rfl: 2   glucose blood test strip, Use as   instructed to check blood sugars twice daily, Disp: 100 each, Rfl: 2   Misc Natural Products (BLOOD SUGAR BALANCE PO), Take 1 capsule by mouth daily. , Disp: , Rfl:    Multiple Vitamin (MULTIVITAMIN WITH MINERALS) TABS tablet, Take 1 tablet by mouth daily with breakfast., Disp: , Rfl:    NON FORMULARY, daily as needed. CBD capsules Pure organic hemp extract, Disp: , Rfl:    pravastatin (PRAVACHOL) 40 MG tablet, One tab  po M-F and skip Saturday/Sundays, Disp: 75 tablet, Rfl: 1   valsartan (DIOVAN) 160 MG tablet, Take 1 tablet (160 mg total) by mouth daily., Disp: 90 tablet, Rfl: 1   No Known Allergies   Review of Systems  Constitutional: Negative.   HENT: Negative.    Eyes: Negative.  Negative for blurred vision.  Respiratory: Negative.    Cardiovascular: Negative.  Negative for chest pain.  Gastrointestinal: Negative.   Endocrine: Negative for polydipsia, polyphagia and polyuria.     Today's Vitals   03/12/22 1409 03/12/22 1502  BP: (!) 180/90 (!) 180/82  Pulse: (!) 59   Temp: 98.5 F (36.9 C)   Weight: 181 lb 9.6 oz (82.4 kg)   Height: 5' 1.8" (1.57 m)   PainSc: 0-No pain    Body mass index is 33.43 kg/m.   Objective:  Physical Exam Vitals and nursing note reviewed.  Constitutional:      Appearance: Normal appearance. She is obese.  HENT:     Head: Normocephalic and atraumatic.     Nose:     Comments: Masked     Mouth/Throat:     Comments: Masked  Eyes:     Extraocular Movements: Extraocular movements intact.  Cardiovascular:     Rate and Rhythm: Normal rate and regular rhythm.     Heart sounds: Normal heart sounds.  Pulmonary:     Effort: Pulmonary effort is normal.     Breath sounds: Normal breath sounds.  Musculoskeletal:     Cervical back: Normal range of motion.  Skin:    General: Skin is warm.  Neurological:     General: No focal deficit present.     Mental Status: She is alert.     Comments: Alert, oriented to self/place.  Psychiatric:        Mood and Affect: Mood normal.        Behavior: Behavior normal.     Assessment And Plan:     1. Type 2 diabetes mellitus with stage 2 chronic kidney disease, without long-term current use of insulin (HCC) Comments: Chronic, I will check labs as below. I will mail results to her so she can share with Dr. Altamese Dilling team.  - Hemoglobin A1c - POCT Urinalysis Dipstick (81002) - Microalbumin / Creatinine Urine Ratio -  Ambulatory referral to Podiatry - CMP14+EGFR  2. Parenchymal renal hypertension, stage 1 through stage 4 or unspecified chronic kidney disease Comments: Uncontrolled. EKG performed, SB w/ neg T waves, possible anterior ischemia. I question if pt is compliant w/ meds, given elevated BP. My concerns were shared w/ - valsartan (DIOVAN) 160 MG tablet; Take 1 tablet (160 mg total) by mouth daily.  Dispense: 90 tablet; Refill: 1 - EKG 12-Lead  3. 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 I wil check uric acid level today.  - Uric acid  4. Dementia of the Alzheimer's type, with late onset, uncomplicated ([place]) Comments: Recent Neuro note reviewed, she will c/w donepezil. She  likely would benefit from [facility] nursing to help with meds.   5. Class 1 obesity due to excess calories with serious comorbidity and body mass index (BMI) of 33.0 to 33.9 in adult Comments: She is encouraged to aim for at least 150 minutes of exercise per week. I will refer her to the PREP program.   6. Immunization due - Flu Vaccine QUAD High Dose(Fluad)   Patient was given opportunity to ask questions. Patient verbalized understanding of the plan and was able to repeat key elements of the plan. All questions were answered to their satisfaction.   I, [name], MD, have reviewed all documentation for this visit. The documentation on 03/12/22 for the exam, diagnosis, procedures, and orders are all accurate and complete.   IF YOU HAVE BEEN REFERRED TO A SPECIALIST, IT MAY TAKE 1-2 WEEKS TO SCHEDULE/PROCESS THE REFERRAL. IF YOU HAVE NOT HEARD FROM US/SPECIALIST IN TWO WEEKS, PLEASE GIVE [place] A CALL AT [phone].   THE PATIENT IS ENCOURAGED TO PRACTICE SOCIAL DISTANCING DUE TO THE COVID-19 PANDEMIC.

## 2022-03-12 NOTE — Patient Instructions (Signed)

## 2022-03-13 LAB — URIC ACID: Uric Acid: 6.1 mg/dL (ref 3.1–7.9)

## 2022-03-13 LAB — CMP14+EGFR
ALT: 29 IU/L (ref 0–32)
AST: 33 IU/L (ref 0–40)
Albumin/Globulin Ratio: 1.5 (ref 1.2–2.2)
Albumin: 4.6 g/dL (ref 3.8–4.8)
Alkaline Phosphatase: 69 IU/L (ref 44–121)
BUN/Creatinine Ratio: 17 (ref 12–28)
BUN: 16 mg/dL (ref 8–27)
Bilirubin Total: 0.4 mg/dL (ref 0.0–1.2)
CO2: 26 mmol/L (ref 20–29)
Calcium: 9.8 mg/dL (ref 8.7–10.3)
Chloride: 105 mmol/L (ref 96–106)
Creatinine, Ser: 0.92 mg/dL (ref 0.57–1.00)
Globulin, Total: 3 g/dL (ref 1.5–4.5)
Glucose: 86 mg/dL (ref 70–99)
Potassium: 4 mmol/L (ref 3.5–5.2)
Sodium: 145 mmol/L — ABNORMAL HIGH (ref 134–144)
Total Protein: 7.6 g/dL (ref 6.0–8.5)
eGFR: 63 mL/min/{1.73_m2} (ref >59)

## 2022-03-13 LAB — HEMOGLOBIN A1C
Est. average glucose Bld gHb Est-mCnc: 137 mg/dL
Hgb A1c MFr Bld: 6.4 % — ABNORMAL HIGH (ref 4.8–5.6)

## 2022-03-13 LAB — MICROALBUMIN / CREATININE URINE RATIO
Creatinine, Urine: 202.3 mg/dL
Microalb/Creat Ratio: 40 mg/g creat — ABNORMAL HIGH (ref 0–29)
Microalbumin, Urine: 80.7 ug/mL

## 2022-03-19 ENCOUNTER — Ambulatory Visit (INDEPENDENT_AMBULATORY_CARE_PROVIDER_SITE_OTHER): Payer: BC Managed Care – PPO | Admitting: Physician Assistant

## 2022-03-19 ENCOUNTER — Encounter: Payer: Self-pay | Admitting: Physician Assistant

## 2022-03-19 VITALS — BP 187/80 | HR 64 | Resp 18 | Ht 64.0 in | Wt 188.0 lb

## 2022-03-19 DIAGNOSIS — G3184 Mild cognitive impairment, so stated: Secondary | ICD-10-CM

## 2022-03-19 MED ORDER — DONEPEZIL HCL 10 MG PO TABS: ORAL_TABLET | ORAL | 11 refills | Status: DC

## 2022-03-19 NOTE — Patient Instructions (Addendum)
It was a pleasure to see you today at our office.   Recommendations:  Meds: Follow up in  6 months Continue Donepezil 10 daily    Repeat neurocognitive testing  Consider CBT   RECOMMENDATIONS FOR ALL PATIENTS WITH MEMORY PROBLEMS: 1. Continue to exercise (Recommend 30 minutes of walking everyday, or 3 hours every week) 2. Increase social interactions - continue going to [place] and enjoy social gatherings with friends and family 3. Eat healthy, avoid fried foods and eat more fruits and vegetables 4. Maintain adequate blood pressure, blood sugar, and blood cholesterol level. Reducing the risk of stroke and cardiovascular disease also helps promoting better memory. 5. Avoid stressful situations. Live a simple life and avoid aggravations. Organize your time and prepare for the next day in anticipation. 6. Sleep well, avoid any interruptions of sleep and avoid any distractions in the bedroom that may interfere with adequate sleep quality 7. Avoid sugar, avoid sweets as there is a strong link between excessive sugar intake, diabetes, and cognitive impairment We discussed the Mediterranean diet, which has been shown to help patients reduce the risk of progressive memory disorders and reduces cardiovascular risk. This includes eating fish, eat fruits and green leafy vegetables, nuts like almonds and hazelnuts, walnuts, and also use olive oil. Avoid fast foods and fried foods as much as possible. Avoid sweets and sugar as sugar use has been linked to worsening of memory function.  There is always a concern of gradual progression of memory problems. If this is the case, then we may need to adjust level of care according to patient needs. Support, both to the patient and caregiver, should then be put into place.    The Alzheimer's Association is here all day, every day for people facing Alzheimer's disease through our free 24/7 Helpline: [phone]. The Helpline provides reliable information and  support to all those who need assistance, such as individuals living with memory loss, Alzheimer's or other dementia, caregivers, health care professionals and the public.  Our highly trained and knowledgeable staff can help you with: Understanding memory loss, dementia and Alzheimer's  Medications and other treatment options  General information about aging and brain health  Skills to provide quality care and to find the best care from professionals  Legal, financial and living-arrangement decisions Our Helpline also features: Confidential care consultation provided by master's level clinicians who can help with decision-making support, crisis assistance and education on issues families face every day  Help in a caller's preferred language using our translation service that features more than 200 languages and dialects  Referrals to local community programs, services and ongoing support     FALL PRECAUTIONS: Be cautious when walking. Scan the area for obstacles that may increase the risk of trips and falls. When getting up in the mornings, sit up at the edge of the bed for a few minutes before getting out of bed. Consider elevating the bed at the head end to avoid drop of blood pressure when getting up. Walk always in a well-lit room (use night lights in the walls). Avoid area rugs or power cords from appliances in the middle of the walkways. Use a walker or a cane if necessary and consider physical therapy for balance exercise. Get your eyesight checked regularly.  FINANCIAL OVERSIGHT: Supervision, especially oversight when making financial decisions or transactions is also recommended.  HOME SAFETY: Consider the safety of the kitchen when operating appliances like stoves, microwave oven, and blender. Consider having supervision and share cooking responsibilities  until no longer able to participate in those. Accidents with firearms and other hazards in the house should be identified and  addressed as well.   ABILITY TO BE LEFT ALONE: If patient is unable to contact 911 operator, consider using LifeLine, or when the need is there, arrange for someone to stay with patients. Smoking is a fire hazard, consider supervision or cessation. Risk of wandering should be assessed by caregiver and if detected at any point, supervision and safe proof recommendations should be instituted.  MEDICATION SUPERVISION: Inability to self-administer medication needs to be constantly addressed. Implement a mechanism to ensure safe administration of the medications.          Mediterranean Diet A Mediterranean diet refers to food and lifestyle choices that are based on the traditions of countries located on the [employer]. This way of eating has been shown to help prevent certain conditions and improve outcomes for people who have chronic diseases, like kidney disease and heart disease. What are tips for following this plan? Lifestyle  Cook and eat meals together with your family, when possible. Drink enough fluid to keep your urine clear or pale yellow. Be physically active every day. This includes: Aerobic exercise like running or swimming. Leisure activities like gardening, walking, or housework. Get 7-8 hours of sleep each night. If recommended by your health care provider, drink red wine in moderation. This means 1 glass a day for nonpregnant women and 2 glasses a day for men. A glass of wine equals 5 oz (150 mL). Reading food labels  Check the serving size of packaged foods. For foods such as rice and pasta, the serving size refers to the amount of cooked product, not dry. Check the total fat in packaged foods. Avoid foods that have saturated fat or trans fats. Check the ingredients list for added sugars, such as corn syrup. Shopping  At the grocery store, buy most of your food from the areas near the walls of the store. This includes: Fresh fruits and vegetables (produce). Grains,  beans, nuts, and seeds. Some of these may be available in unpackaged forms or large amounts (in bulk). Fresh seafood. Poultry and eggs. Low-fat dairy products. Buy whole ingredients instead of prepackaged foods. Buy fresh fruits and vegetables in-season from local farmers markets. Buy frozen fruits and vegetables in resealable bags. If you do not have access to quality fresh seafood, buy precooked frozen shrimp or canned fish, such as tuna, salmon, or sardines. Buy small amounts of raw or cooked vegetables, salads, or olives from the deli or salad bar at your store. Stock your pantry so you always have certain foods on hand, such as olive oil, canned tuna, canned tomatoes, rice, pasta, and beans. Cooking  Cook foods with extra-virgin olive oil instead of using butter or other vegetable oils. Have meat as a side dish, and have vegetables or grains as your main dish. This means having meat in small portions or adding small amounts of meat to foods like pasta or stew. Use beans or vegetables instead of meat in common dishes like chili or lasagna. Experiment with different cooking methods. Try roasting or broiling vegetables instead of steaming or sauteing them. Add frozen vegetables to soups, stews, pasta, or rice. Add nuts or seeds for added healthy fat at each meal. You can add these to yogurt, salads, or vegetable dishes. Marinate fish or vegetables using olive oil, lemon juice, garlic, and fresh herbs. Meal planning  Plan to eat 1 vegetarian meal one day  each week. Try to work up to 2 vegetarian meals, if possible. Eat seafood 2 or more times a week. Have healthy snacks readily available, such as: Vegetable sticks with hummus. Greek yogurt. Fruit and nut trail mix. Eat balanced meals throughout the week. This includes: Fruit: 2-3 servings a day Vegetables: 4-5 servings a day Low-fat dairy: 2 servings a day Fish, poultry, or lean meat: 1 serving a day Beans and legumes: 2 or more  servings a week Nuts and seeds: 1-2 servings a day Whole grains: 6-8 servings a day Extra-virgin olive oil: 3-4 servings a day Limit red meat and sweets to only a few servings a month What are my food choices? Mediterranean diet Recommended Grains: Whole-grain pasta. Brown rice. Bulgar wheat. Polenta. Couscous. Whole-wheat bread. Orpah Cobb. Vegetables: Artichokes. Beets. Broccoli. Cabbage. Carrots. Eggplant. Green beans. Chard. Kale. Spinach. Onions. Leeks. Peas. Squash. Tomatoes. Peppers. Radishes. Fruits: Apples. Apricots. Avocado. Berries. Bananas. Cherries. Dates. Figs. Grapes. Lemons. Melon. Oranges. Peaches. Plums. Pomegranate. Meats and other protein foods: Beans. Almonds. Sunflower seeds. Pine nuts. Peanuts. Cod. Salmon. Scallops. Shrimp. Tuna. Tilapia. Clams. Oysters. Eggs. Dairy: Low-fat milk. Cheese. Greek yogurt. Beverages: Water. Red wine. Herbal tea. Fats and oils: Extra virgin olive oil. Avocado oil. Grape seed oil. Sweets and desserts: [place] yogurt with honey. Baked apples. Poached pears. Trail mix. Seasoning and other foods: Basil. Cilantro. Coriander. Cumin. Mint. Parsley. Sage. Rosemary. Tarragon. Garlic. Oregano. Thyme. Pepper. Balsalmic vinegar. Tahini. Hummus. Tomato sauce. Olives. Mushrooms. Limit these Grains: Prepackaged pasta or rice dishes. Prepackaged cereal with added sugar. Vegetables: Deep fried potatoes (french fries). Fruits: Fruit canned in syrup. Meats and other protein foods: Beef. Pork. Lamb. Poultry with skin. Hot dogs. Tomasa Blase. Dairy: Ice cream. Sour cream. Whole milk. Beverages: Juice. Sugar-sweetened soft drinks. Beer. Liquor and spirits. Fats and oils: Butter. Canola oil. Vegetable oil. Beef fat (tallow). Lard. Sweets and desserts: Cookies. Cakes. Pies. Candy. Seasoning and other foods: Mayonnaise. Premade sauces and marinades. The items listed may not be a complete list. Talk with your dietitian about what dietary choices are right for  you. Summary The Mediterranean diet includes both food and lifestyle choices. Eat a variety of fresh fruits and vegetables, beans, nuts, seeds, and whole grains. Limit the amount of red meat and sweets that you eat. Talk with your health care provider about whether it is safe for you to drink red wine in moderation. This means 1 glass a day for nonpregnant women and 2 glasses a day for men. A glass of wine equals 5 oz (150 mL). This information is not intended to replace advice given to you by your health care provider. Make sure you discuss any questions you have with your health care provider. Document Released: 12/22/2015 Document Revised: 01/24/2016 Document Reviewed: 12/22/2015 Elsevier Interactive Patient Education  2017 [employer].

## 2022-03-19 NOTE — Progress Notes (Signed)
Assessment/Plan:   Mild cognitive impairment likely due to Alzheimer's disease, late onset with mood disturbance  Pamela Key is a very pleasant 81 y.o. 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 Patient is currently on donepezil 10 mg daily .  Her MMSE today is 26/30, however, given her high degree of education, clarity of the diagnosis and disease progression needs to be further evaluated as the family does report worsening of her memory.   Follow up in 6  months. Continue donepezil 10 mg daily. Side effects were discussed  Continue B12 shots or supplement  Recommend good control of cardiovascular risk factors.   Continue to control mood as per PCP. Consider CBT Repeat neurocognitive testing for clarity of the diagnosis and disease progression    Subjective:    This patient is accompanied in the office by her granddaughter who supplements the history.  Previous records as well as any outside records available were reviewed prior to todays visit. Last seen on 09/12/21     Any changes in memory since last visit?  Her granddaughter reports that her short-term memory may be worse than prior.  Sometimes she forgets recent conversations.  Long-term memory is still good.  She has an aide who manages these and Thursdays to help her with ADLs, and her son is trying to increase these visits to 15 to 20 hours a week for safety.  She still likes to attend the [employer].  repeats oneself?  Endorsed Disoriented when walking into a room?  Patient denies   Leaving objects in unusual places?  Since her last visit, she has already lost 4 phones because she has misplaced them, then accuses somebody else of having stolen them. Ambulates  with difficulty?   She uses a cane to ambulate for  stability.   Recent falls?  Patient denies   Any head injuries?  Patient denies   History of seizures?   Patient denies   Wandering behavior?  Patient denies   Patient drives?   Patient no longer drives  Any mood changes since last visit?  She gets frustrated easier because of her memory difficulties. If confronted she may get 'verbally mean" Any worsening depression?:  Patient denies patient is open to the idea of cognitive behavioral therapy. Hallucinations?  Patient denies   Paranoia?  Accuses people of stealing her belongings, to later find them.  Patient reports that sleeps well without vivid dreams, REM behavior or sleepwalking   History of sleep apnea?  Patient denies   Any hygiene concerns?  She has less desire to shower and change clothes.  She becomes quickly defensive that "I might not take frequent showers but I am constantly washing myself ". Independent of bathing and dressing?  Endorsed  Does the patient needs help with medications? Family in charge  Who is in charge of the finances?  Family is in charge   Any changes in appetite?  She may forget if she ate or not, but she eats what is given to her    Patient have trouble swallowing? Patient denies   Does the patient cook?  Occasionally, but with assistance  Any kitchen accidents such as leaving the stove on? Patient denies   Any headaches?  Patient denies   Double vision? Patient denies   Any  focal numbness or tingling?  Patient denies   Chronic back pain Patient denies   Unilateral weakness?  Patient denies   Any tremors?  Patient denies   Any history of anosmia?  Patient denies   Any incontinence of urine? Endorsed. Wears lightday pads  Any bowel dysfunction?   Patient denies      Patient lives with: Daughter     Initial HPI 11/11/20 The patient is seen in neurologic consultation at the request of Glendale Chard, MD for the evaluation of memory.  The patient is accompanied by daughter Pamela Key who supplements the  history. The patient is a 81 y.o. year old right-handed female who has had memory issues for about  1 year.  The patient denies, but daughter reports that she forgets "stuff from minutes 10 minutes, for example to lay in the waiting room at this office, she wrote to date correctly, and then asked her daughter what was the date ".  She repeats conversations, and asked the same questions frequently especially over the last year.  When she becomes frustrated, which has been happening more frequently over the last 2 months, "she gets mean verbally, and occasionally throw stuff ". The patient lived alone until a few weeks ago, but they have been doing some repair in the house, says she has small with her grandson who lives around the corner, and awaiting until the repairs are completed.  She is trying to return to her home, but this time with a sitter.  She denies any depression.  She sleeps well, and denies any vivid dreams or sleepwalking.  She takes showers daily.  However she does not want to change clothes.  Her daughter lives up clothing for her, and she prefers to wear the same clothing that she works throughout the week "over and over" She takes her medications, and places them in the pillbox.  She denies missing any doses.  However, when her high blood pressure was addressed during this visit, the patient stated "I forgot to take it this morning ".  She does her own finances, and denies forgetting to pay any bills, or over paying.  She denies leaving objects in unusual places. Her appetite is good and she eats healthy.  She denies trouble swallowing.  "Lacinda Axon all the time".  Denies leaving the stove on or the faucet on. Daughter adds that she did leave socks in the microwave recently This burned the microwave and then he had to be replaced. She ambulates with a cane.  She continues to drive short distances, denies getting lost.  Daughter agrees.Denies headaches, trauma, or injuries to the head, double vision,  dizziness, focal numbness or tingling, unilateral weakness or tremors. Denies urine incontinence or retention. Denies constipation or diarrhea. Denies anosmia. Denies history of OSA, ETOH  Tobacco. Family History remarkable for mother with dementia.    Labs  B12 5/16 226  (she takes B12 shots) Normal  TSH Hb A1C 6.3       Neurocognitive testing Dr. Nicole Kindred 02/27/21 Ms. 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 While she does have some behavior changes, I think these is likely an exacerbation of premorbid characteristics and/or interaction of AD level behavior changes and premorbid factors. She seems to have poor insight and I have the feeling that there may be continued conflict within the family, as she does not think any help is needed. They may wish to consult with  an elder Chief Executive Officer. A trial of donepezil or other first-line antidementia medication could be considered.    MRI brain without contrast 11/21/20  No evidence of acute intracranial abnormality.mild chronic small vessel ischemic changes within the cerebral white matter. Mild generalized cerebral and cerebellar atrophy. Incompletely assessed cervical spondylosis. PREVIOUS MEDICATIONS:   CURRENT MEDICATIONS:  Outpatient Encounter Medications as of 03/19/2022  Medication Sig   allopurinol (ZYLOPRIM) 100 MG tablet Take 1 tablet (100 mg total) by mouth daily.   Blood Glucose Monitoring Suppl (ACCU-CHEK GUIDE) w/Device KIT Use to check blood sugars twice daily E11.65   ferrous sulfate 325 (65 FE) MG tablet Take 325 mg by mouth See admin instructions. Take one tablet (325 mg) by mouth daily Monday thru Friday (skip Saturday and _0 /10/2021    1:00 PM  MMSE - Mini Mental State Exam  Orientation to time 4  Orientation to Place 5  Registration 3  Attention/ Calculation 5  Recall 0  Language- name 2 objects 2  Language- repeat 1  Language- follow 3 step command 3  Language- read & follow direction 1  Write a sentence 1  Copy design 1  Total score 26      11/11/2020   10:00 AM  Montreal Cognitive Assessment   Visuospatial/ Executive (0/5) 3  Naming (0/3) 1  Attention: Read list of digits (0/2) 2  Attention: Read list of letters (0/1) 1  Attention: Serial 7 subtraction starting at 100 (0/3) 3  Language: Repeat phrase (0/2) 1  Language : Fluency (0/1) 1  Abstraction (0/2) 1  Delayed Recall (0/5) 1  Orientation (0/6) 5  Total 19  Adjusted Score (based on education) 19    Objective:     PHYSICAL EXAMINATION:    VITALS:   Vitals:   03/19/22 1055  BP: (!) 187/80  Pulse: 64  Resp: 18  SpO2: 97%  Weight: 188 lb (85.3 kg)  Height: _1  (1.626 m)    GEN:  The patient appears stated age and is in NAD. HEENT:  Normocephalic, atraumatic.   Neurological examination:  General: NAD, well-groomed, appears stated age. Orientation: The patient is alert. Oriented to person, place and date Cranial nerves: There is good facial symmetry.The speech  is fluent and clear. No aphasia or dysarthria. Fund of knowledge is appropriate. Recent and remote memory are impaired. Attention and concentration are normal.  Able to name objects and repeat phrases.  Hearing is intact to conversational tone.    Sensation: Sensation is intact to light touch throughout Motor: Strength is  at least antigravity x4. Tremors: none  DTR's 2/4 in UE/LE     Movement examination: Tone: There is normal tone in the UE/LE Abnormal movements:  no tremor.  No myoclonus.  No asterixis.   Coordination:  There is no decremation with RAM's. Normal finger to nose  Gait and Station: The patient has no difficulty arising out of a deep-seated chair without the use of the hands. The patient's stride length is good.  Gait is cautious and narrow.    Thank you for allowing [place] the opportunity to participate in the care of this nice patient. Please do not hesitate to contact us for any questions or concerns.   Total time spent on today's visit was 38 minutes dedicated to this patient today, preparing to see patient, examining the patient, ordering tests and/or medications and counseling the patient, documenting clinical information in the EHR or other health record, independently interpreting results and communicating results to the patient/family, discussing treatment and goals, answering patient's questions and coordinating care.  Cc:  Glendale Chard, MD  Sharene Butters 03/19/2022 1:15 PM

## 2022-05-03 ENCOUNTER — Encounter: Payer: Medicare Other | Admitting: Internal Medicine

## 2022-05-22 NOTE — Telephone Encounter (Signed)
Chmg-error.  

## 2022-07-04 ENCOUNTER — Ambulatory Visit: Payer: Medicare Other

## 2022-07-04 ENCOUNTER — Ambulatory Visit: Payer: Medicare Other | Admitting: Internal Medicine

## 2022-07-19 ENCOUNTER — Ambulatory Visit: Payer: Medicare Other | Admitting: Internal Medicine

## 2022-10-04 ENCOUNTER — Ambulatory Visit (INDEPENDENT_AMBULATORY_CARE_PROVIDER_SITE_OTHER): Payer: Medicare Other | Admitting: Physician Assistant

## 2022-10-04 ENCOUNTER — Encounter: Payer: Self-pay | Admitting: Physician Assistant

## 2022-10-04 VITALS — BP 140/80 | HR 67 | Resp 18 | Ht 64.0 in | Wt 182.0 lb

## 2022-10-04 DIAGNOSIS — R413 Other amnesia: Secondary | ICD-10-CM

## 2022-10-04 DIAGNOSIS — F09 Unspecified mental disorder due to known physiological condition: Secondary | ICD-10-CM

## 2022-10-04 MED ORDER — DONEPEZIL HCL 10 MG PO TABS
ORAL_TABLET | ORAL | 11 refills | Status: DC
Start: 2022-10-04 — End: 2023-04-01

## 2022-10-04 NOTE — Patient Instructions (Addendum)
It was a pleasure to see you today at our office.   Recommendations:  Meds: Follow up in  6 months Resume Donepezil 10 daily    Repeat neurocognitive testing in 12/2022      RECOMMENDATIONS FOR ALL PATIENTS WITH MEMORY PROBLEMS: 1. Continue to exercise (Recommend 30 minutes of walking everyday, or 3 hours every week) 2. Increase social interactions - continue going to [place] and enjoy social gatherings with friends and family 3. Eat healthy, avoid fried foods and eat more fruits and vegetables 4. Maintain adequate blood pressure, blood sugar, and blood cholesterol level. Reducing the risk of stroke and cardiovascular disease also helps promoting better memory. 5. Avoid stressful situations. Live a simple life and avoid aggravations. Organize your time and prepare for the next day in anticipation. 6. Sleep well, avoid any interruptions of sleep and avoid any distractions in the bedroom that may interfere with adequate sleep quality 7. Avoid sugar, avoid sweets as there is a strong link between excessive sugar intake, diabetes, and cognitive impairment We discussed the Mediterranean diet, which has been shown to help patients reduce the risk of progressive memory disorders and reduces cardiovascular risk. This includes eating fish, eat fruits and green leafy vegetables, nuts like almonds and hazelnuts, walnuts, and also use olive oil. Avoid fast foods and fried foods as much as possible. Avoid sweets and sugar as sugar use has been linked to worsening of memory function.  There is always a concern of gradual progression of memory problems. If this is the case, then we may need to adjust level of care according to patient needs. Support, both to the patient and caregiver, should then be put into place.    The Alzheimer's Association is here all day, every day for people facing Alzheimer's disease through our free 24/7 Helpline: [phone]) 245-9544. The Helpline provides reliable information and support  to all those who need assistance, such as individuals living with memory loss, Alzheimer's or other dementia, caregivers, health care professionals and the public.  Our highly trained and knowledgeable staff can help you with: Understanding memory loss, dementia and Alzheimer's  Medications and other treatment options  General information about aging and brain health  Skills to provide quality care and to find the best care from professionals  Legal, financial and living-arrangement decisions Our Helpline also features: Confidential care consultation provided by master's level clinicians who can help with decision-making support, crisis assistance and education on issues families face every day  Help in a caller's preferred language using our translation service that features more than 200 languages and dialects  Referrals to local community programs, services and ongoing support     FALL PRECAUTIONS: Be cautious when walking. Scan the area for obstacles that may increase the risk of trips and falls. When getting up in the mornings, sit up at the edge of the bed for a few minutes before getting out of bed. Consider elevating the bed at the head end to avoid drop of blood pressure when getting up. Walk always in a well-lit room (use night lights in the walls). Avoid area rugs or power cords from appliances in the middle of the walkways. Use a walker or a cane if necessary and consider physical therapy for balance exercise. Get your eyesight checked regularly.  FINANCIAL OVERSIGHT: Supervision, especially oversight when making financial decisions or transactions is also recommended.  HOME SAFETY: Consider the safety of the kitchen when operating appliances like stoves, microwave oven, and blender. Consider having supervision and share  cooking responsibilities until no longer able to participate in those. Accidents with firearms and other hazards in the house should be identified and addressed as  well.   ABILITY TO BE LEFT ALONE: If patient is unable to contact 911 operator, consider using LifeLine, or when the need is there, arrange for someone to stay with patients. Smoking is a fire hazard, consider supervision or cessation. Risk of wandering should be assessed by caregiver and if detected at any point, supervision and safe proof recommendations should be instituted.  MEDICATION SUPERVISION: Inability to self-administer medication needs to be constantly addressed. Implement a mechanism to ensure safe administration of the medications.          Mediterranean Diet A Mediterranean diet refers to food and lifestyle choices that are based on the traditions of countries located on the [employer]. This way of eating has been shown to help prevent certain conditions and improve outcomes for people who have chronic diseases, like kidney disease and heart disease. What are tips for following this plan? Lifestyle  Cook and eat meals together with your family, when possible. Drink enough fluid to keep your urine clear or pale yellow. Be physically active every day. This includes: Aerobic exercise like running or swimming. Leisure activities like gardening, walking, or housework. Get 7-8 hours of sleep each night. If recommended by your health care provider, drink red wine in moderation. This means 1 glass a day for nonpregnant women and 2 glasses a day for men. A glass of wine equals 5 oz (150 mL). Reading food labels  Check the serving size of packaged foods. For foods such as rice and pasta, the serving size refers to the amount of cooked product, not dry. Check the total fat in packaged foods. Avoid foods that have saturated fat or trans fats. Check the ingredients list for added sugars, such as corn syrup. Shopping  At the grocery store, buy most of your food from the areas near the walls of the store. This includes: Fresh fruits and vegetables (produce). Grains, beans, nuts,  and seeds. Some of these may be available in unpackaged forms or large amounts (in bulk). Fresh seafood. Poultry and eggs. Low-fat dairy products. Buy whole ingredients instead of prepackaged foods. Buy fresh fruits and vegetables in-season from local farmers markets. Buy frozen fruits and vegetables in resealable bags. If you do not have access to quality fresh seafood, buy precooked frozen shrimp or canned fish, such as tuna, salmon, or sardines. Buy small amounts of raw or cooked vegetables, salads, or olives from the deli or salad bar at your store. Stock your pantry so you always have certain foods on hand, such as olive oil, canned tuna, canned tomatoes, rice, pasta, and beans. Cooking  Cook foods with extra-virgin olive oil instead of using butter or other vegetable oils. Have meat as a side dish, and have vegetables or grains as your main dish. This means having meat in small portions or adding small amounts of meat to foods like pasta or stew. Use beans or vegetables instead of meat in common dishes like chili or lasagna. Experiment with different cooking methods. Try roasting or broiling vegetables instead of steaming or sauteing them. Add frozen vegetables to soups, stews, pasta, or rice. Add nuts or seeds for added healthy fat at each meal. You can add these to yogurt, salads, or vegetable dishes. Marinate fish or vegetables using olive oil, lemon juice, garlic, and fresh herbs. Meal planning  Plan to eat 1 vegetarian meal  one day each week. Try to work up to 2 vegetarian meals, if possible. Eat seafood 2 or more times a week. Have healthy snacks readily available, such as: Vegetable sticks with hummus. Greek yogurt. Fruit and nut trail mix. Eat balanced meals throughout the week. This includes: Fruit: 2-3 servings a day Vegetables: 4-5 servings a day Low-fat dairy: 2 servings a day Fish, poultry, or lean meat: 1 serving a day Beans and legumes: 2 or more servings a  week Nuts and seeds: 1-2 servings a day Whole grains: 6-8 servings a day Extra-virgin olive oil: 3-4 servings a day Limit red meat and sweets to only a few servings a month What are my food choices? Mediterranean diet Recommended Grains: Whole-grain pasta. Brown rice. Bulgar wheat. Polenta. Couscous. Whole-wheat bread. Orpah Cobb. Vegetables: Artichokes. Beets. Broccoli. Cabbage. Carrots. Eggplant. Green beans. Chard. Kale. Spinach. Onions. Leeks. Peas. Squash. Tomatoes. Peppers. Radishes. Fruits: Apples. Apricots. Avocado. Berries. Bananas. Cherries. Dates. Figs. Grapes. Lemons. Melon. Oranges. Peaches. Plums. Pomegranate. Meats and other protein foods: Beans. Almonds. Sunflower seeds. Pine nuts. Peanuts. Cod. Salmon. Scallops. Shrimp. Tuna. Tilapia. Clams. Oysters. Eggs. Dairy: Low-fat milk. Cheese. Greek yogurt. Beverages: Water. Red wine. Herbal tea. Fats and oils: Extra virgin olive oil. Avocado oil. Grape seed oil. Sweets and desserts: [place] yogurt with honey. Baked apples. Poached pears. Trail mix. Seasoning and other foods: Basil. Cilantro. Coriander. Cumin. Mint. Parsley. Sage. Rosemary. Tarragon. Garlic. Oregano. Thyme. Pepper. Balsalmic vinegar. Tahini. Hummus. Tomato sauce. Olives. Mushrooms. Limit these Grains: Prepackaged pasta or rice dishes. Prepackaged cereal with added sugar. Vegetables: Deep fried potatoes (french fries). Fruits: Fruit canned in syrup. Meats and other protein foods: Beef. Pork. Lamb. Poultry with skin. Hot dogs. Tomasa Blase. Dairy: Ice cream. Sour cream. Whole milk. Beverages: Juice. Sugar-sweetened soft drinks. Beer. Liquor and spirits. Fats and oils: Butter. Canola oil. Vegetable oil. Beef fat (tallow). Lard. Sweets and desserts: Cookies. Cakes. Pies. Candy. Seasoning and other foods: Mayonnaise. Premade sauces and marinades. The items listed may not be a complete list. Talk with your dietitian about what dietary choices are right for you. Summary The  Mediterranean diet includes both food and lifestyle choices. Eat a variety of fresh fruits and vegetables, beans, nuts, seeds, and whole grains. Limit the amount of red meat and sweets that you eat. Talk with your health care provider about whether it is safe for you to drink red wine in moderation. This means 1 glass a day for nonpregnant women and 2 glasses a day for men. A glass of wine equals 5 oz (150 mL). This information is not intended to replace advice given to you by your health care provider. Make sure you discuss any questions you have with your health care provider. Document Released: 12/22/2015 Document Revised: 01/24/2016 Document Reviewed: 12/22/2015 Elsevier Interactive Patient Education  2017 [employer].

## 2022-10-04 NOTE — Progress Notes (Signed)
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 Patient was not taking her  donepezil 10 mg daily " I didn't think I needed it".  She is scheduled for repeat Neuropsych evaluation later this year for clarity of this diagnosis. She continues to remain independent with her ADLs. She does not drive.      Recommendations:   Follow up in 6  months Resume donepezil 10 mg daily. Side effects and importance to remain on the medication discussed  Patient is scheduled on 12/2022 for repeat Neuropsych testing for clarity of diagnosis and disease trajectory   Continue B12 supplements  Recommend good control of cardiovascular risk factors Continue to control mood as per PCP    Subjective:   This patient is here alone. Previous records as well as any outside records available were reviewed prior to todays visit.   Patient was last seen on 03/19/22 with MMSE of 26/30     Any changes in memory since last visit? Short term memory is about the same.  Patient has some difficulty remembering recent conversations and people's names, "not different from prior, may need to write sometimes". 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 LTM is good repeats oneself?  Endorsed Disoriented when walking into a room?  Patient denies  Leaving objects in unusual places? She misplaces objects such as her phone but not as frequently  Wandering behavior?   denies   Any personality changes since last visit? She still gets frustrated when she cannot remember and gets "verbally mean".   Any worsening depression?: denies   Hallucinations or paranoia?  denies   Seizures?   denies    Any sleep changes?  Sleeps well. Denies vivid dreams, REM behavior or sleepwalking   Sleep apnea?   denies   Any hygiene concerns? As before, she has decreased interest in showering.  Independent of bathing and dressing?  Endorsed  Does the patient needs help with medications? Family is in charge   Who is in charge of the finances? Family is in charge     Any changes in appetite?  denies     Patient have trouble swallowing?  denies   Does the patient cook? No  Any headaches?    denies   Vision changes? denies Chronic back pain  denies   Ambulates with difficulty?    denies   Recent falls or head injuries? denies     Unilateral weakness, numbness or tingling?  denies   Any tremors?  denies   Any anosmia?    denies   Any incontinence of urine? Endorsed, wears pantiliners. Any bowel dysfunction?  denies      Patient lives with grandson  She has an aide that comes a few days a week.  Does the patient drive? No longer drives "because I don't have a vehicle".    Past Medical History:  Diagnosis Date   Diabetes mellitus without complication (HCC)    Gout    High cholesterol    Hypertension      Past Surgical History:  Procedure Laterality Date  TUBAL LIGATION  1976     PREVIOUS MEDICATIONS:   CURRENT MEDICATIONS:  Outpatient Encounter Medications as of 10/04/2022  Medication Sig   allopurinol (ZYLOPRIM) 100 MG tablet Take 1 tablet (100 mg total) by mouth daily.   Blood Glucose Monitoring Suppl (ACCU-CHEK GUIDE) w/Device KIT Use to check blood sugars twice daily E11.65   donepezil (ARICEPT) 10 MG tablet Take 1 tab 10 mg daily   ferrous sulfate 325 (65 FE) MG tablet Take 325 mg by mouth See admin instructions. Take one tablet (325 mg) by mouth daily Monday thru Friday (skip Saturday and Sunday)   glucose blood (ACCU-CHEK GUIDE) test strip Use to check blood sugars twice daily E11.65    glucose blood test strip Use as instructed to check blood sugars twice daily   Misc Natural Products (BLOOD SUGAR BALANCE PO) Take 1 capsule by mouth daily.    Multiple Vitamin (MULTIVITAMIN WITH MINERALS) TABS tablet Take 1 tablet by mouth daily with breakfast.   NON FORMULARY daily as needed. CBD capsules Pure organic hemp extract   pravastatin (PRAVACHOL) 40 MG tablet One tab po M-F and skip Saturday/Sundays   valsartan (DIOVAN) 160 MG tablet Take 1 tablet (160 mg total) by mouth daily.   [DISCONTINUED] donepezil (ARICEPT) 10 MG tablet Take 1 tab 10 mg daily   No facility-administered encounter medications on file as of 10/04/2022.     Objective:     PHYSICAL EXAMINATION:    VITALS:   Vitals:   10/04/22 1048 10/04/22 1117  BP: (!) 175/80 (!) 140/80  Pulse: 67   Resp: 18   SpO2: 95%   Weight: 182 lb (82.6 kg)   Height: 5\' 4"  (1.626 m)     GEN:  The patient appears stated age and is in NAD. HEENT:  Normocephalic, atraumatic.   Neurological examination:  General: NAD, well-groomed, appears stated age. Orientation: The patient is alert. Oriented to person, place and time Cranial nerves: There is good facial symmetry.The speech is fluent and clear. No aphasia or dysarthria. Fund of knowledge is appropriate. Recent memory impaired and remote memory is normal.  Attention and concentration are normal.  Able to name objects and repeat phrases.  Hearing is intact to conversational tone.   Delayed recall 0/3 Sensation: Sensation is intact to light touch throughout Motor: Strength is at least antigravity x4. Tremors: none  DTR's 2/4 in UE/LE      11/11/2020   10:00 AM  Montreal Cognitive Assessment   Visuospatial/ Executive (0/5) 3  Naming (0/3) 1  Attention: Read list of digits (0/2) 2  Attention: Read list of letters (0/1) 1  Attention: Serial 7 subtraction starting at 100 (0/3) 3  Language: Repeat phrase (0/2) 1  Language : Fluency (0/1) 1  Abstraction (0/2) 1  Delayed  Recall (0/5) 1  Orientation (0/6) 5  Total 19  Adjusted Score (based on education) 19       10/04/2022   11:00 AM 03/19/2022    1:00 PM  MMSE - Mini Mental State Exam  Orientation to time 4 4  Orientation to Place 5 5  Registration 3 3  Attention/ Calculation 5 5  Recall 0 0  Language- name 2 objects 2 2  Language- repeat 1 1  Language- follow 3 step command 3 3  Language- read & follow direction 1 1  Write a sentence 1 1  Copy design 1 1  Total score 26 26       Movement examination: Tone: There is normal  tone in the UE/LE Abnormal movements:  no tremor.  No myoclonus.  No asterixis.   Coordination:  There is no decremation with RAM's. Normal finger to nose  Gait and Station: The patient has no difficulty arising out of a deep-seated chair without the use of the hands. The patient's stride length is good.  Gait is cautious and narrow.   Thank you for allowing [place] the opportunity to participate in the care of this nice patient. Please do not hesitate to contact us for any questions or concerns.   Total time spent on today's visit was 20 minutes dedicated to this patient today, preparing to see patient, examining the patient, ordering tests and/or medications and counseling the patient, documenting clinical information in the EHR or other health record, independently interpreting results and communicating results to the patient/family, discussing treatment and goals, answering patient's questions and coordinating care.  Cc:  [name], MD  [name] 10/04/2022 11:26 AM

## 2022-10-15 ENCOUNTER — Encounter: Payer: Self-pay | Admitting: Internal Medicine

## 2022-11-06 DIAGNOSIS — Z1159 Encounter for screening for other viral diseases: Secondary | ICD-10-CM | POA: Diagnosis not present

## 2022-11-06 DIAGNOSIS — Z79899 Other long term (current) drug therapy: Secondary | ICD-10-CM | POA: Diagnosis not present

## 2022-11-06 DIAGNOSIS — E1169 Type 2 diabetes mellitus with other specified complication: Secondary | ICD-10-CM | POA: Diagnosis not present

## 2022-11-06 DIAGNOSIS — Z114 Encounter for screening for human immunodeficiency virus [HIV]: Secondary | ICD-10-CM | POA: Diagnosis not present

## 2022-11-06 DIAGNOSIS — I10 Essential (primary) hypertension: Secondary | ICD-10-CM | POA: Diagnosis not present

## 2022-11-06 DIAGNOSIS — Z136 Encounter for screening for cardiovascular disorders: Secondary | ICD-10-CM | POA: Diagnosis not present

## 2022-11-06 DIAGNOSIS — Z0000 Encounter for general adult medical examination without abnormal findings: Secondary | ICD-10-CM | POA: Diagnosis not present

## 2022-11-06 DIAGNOSIS — E785 Hyperlipidemia, unspecified: Secondary | ICD-10-CM | POA: Diagnosis not present

## 2022-11-06 DIAGNOSIS — M109 Gout, unspecified: Secondary | ICD-10-CM | POA: Diagnosis not present

## 2022-12-20 ENCOUNTER — Encounter: Payer: Self-pay | Admitting: Psychology

## 2022-12-20 DIAGNOSIS — I2510 Atherosclerotic heart disease of native coronary artery without angina pectoris: Secondary | ICD-10-CM | POA: Insufficient documentation

## 2022-12-20 DIAGNOSIS — E785 Hyperlipidemia, unspecified: Secondary | ICD-10-CM | POA: Insufficient documentation

## 2022-12-20 DIAGNOSIS — I10 Essential (primary) hypertension: Secondary | ICD-10-CM | POA: Insufficient documentation

## 2022-12-20 DIAGNOSIS — E538 Deficiency of other specified B group vitamins: Secondary | ICD-10-CM | POA: Insufficient documentation

## 2022-12-20 DIAGNOSIS — N182 Chronic kidney disease, stage 2 (mild): Secondary | ICD-10-CM | POA: Insufficient documentation

## 2022-12-20 DIAGNOSIS — M109 Gout, unspecified: Secondary | ICD-10-CM | POA: Insufficient documentation

## 2022-12-21 ENCOUNTER — Encounter: Payer: BC Managed Care – PPO | Admitting: Psychology

## 2022-12-21 ENCOUNTER — Encounter: Payer: Self-pay | Admitting: Psychology

## 2022-12-21 DIAGNOSIS — K648 Other hemorrhoids: Secondary | ICD-10-CM

## 2022-12-21 DIAGNOSIS — R079 Chest pain, unspecified: Secondary | ICD-10-CM | POA: Insufficient documentation

## 2022-12-21 DIAGNOSIS — Z800 Family history of malignant neoplasm of digestive organs: Secondary | ICD-10-CM

## 2022-12-21 HISTORY — DX: Other hemorrhoids: K64.8

## 2022-12-21 HISTORY — DX: Family history of malignant neoplasm of digestive organs: Z80.0

## 2022-12-27 ENCOUNTER — Encounter: Payer: BC Managed Care – PPO | Admitting: Psychology

## 2023-01-29 ENCOUNTER — Ambulatory Visit: Payer: 59 | Admitting: Physician Assistant

## 2023-01-29 VITALS — BP 172/90 | HR 62

## 2023-01-29 DIAGNOSIS — Z23 Encounter for immunization: Secondary | ICD-10-CM

## 2023-01-29 DIAGNOSIS — I10 Essential (primary) hypertension: Secondary | ICD-10-CM | POA: Diagnosis not present

## 2023-01-29 NOTE — Progress Notes (Signed)
Pt has not take medications this morning. Pt recommended to go home and take medications, recheck blood pressure and go to urgent care given severity of BP readings.Pt educated on the risks of high blood pressure and hypertensive crisis.

## 2023-02-12 ENCOUNTER — Encounter: Payer: Self-pay | Admitting: Physician Assistant

## 2023-02-12 ENCOUNTER — Telehealth: Payer: Self-pay | Admitting: Physician Assistant

## 2023-02-12 NOTE — Telephone Encounter (Signed)
Pt's son called in stating he feels there has been a decline with the pt's dementia. He is wondering what else he can do?

## 2023-02-12 NOTE — Telephone Encounter (Signed)
Patients  son sent mychart message please see message

## 2023-03-10 IMAGING — MR MR HEAD W/O CM
9 series · 48 of 48 positions shown · non-contrast
Comparison: No pertinent prior exams available for comparison.

CLINICAL DATA: Memory loss. Additional history provided by scanning
technologist: Memory loss, confusion.

EXAM:
MRI HEAD WITHOUT CONTRAST
TECHNIQUE: Multiplanar, multiecho pulse sequences of the brain and surrounding
structures were obtained without intravenous contrast.

[Series 5: T1 · sagittal · 4.0mm · 0.75mm/px · 2 of 31 slices shown]
[im 1/31]
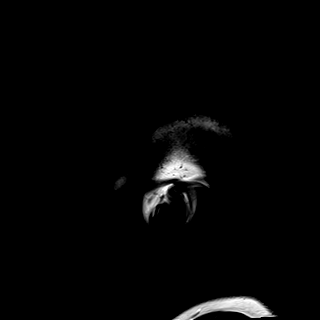
[im 31/31]
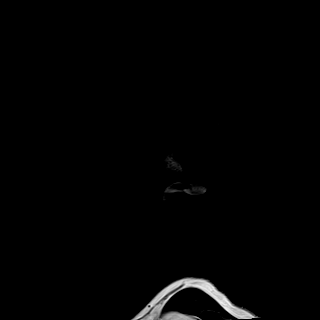

[Series 6: DWI · axial · 3.0mm · 0.94mm/px · z∈[-83,+57]mm · 16 of 160 slices shown (1 of 3)]
[im 1/160]
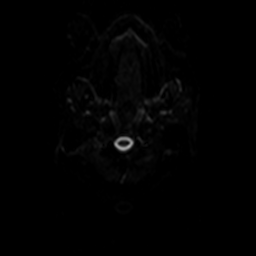
[im 11/160]
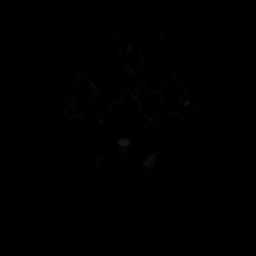
[im 22/160]
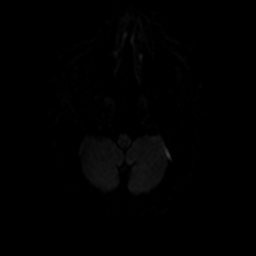
[im 32/160]
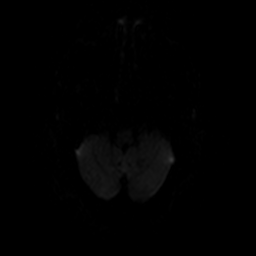
[im 43/160]
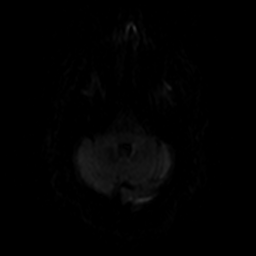
[im 54/160]
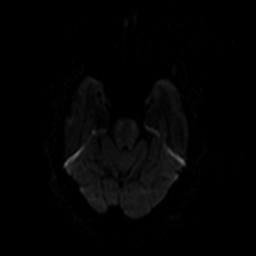
[im 64/160]
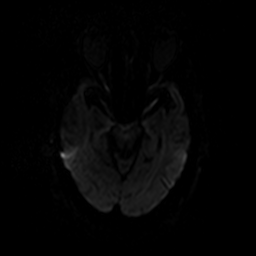
[im 75/160]
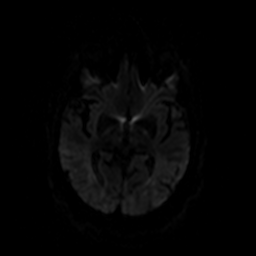
[im 85/160]
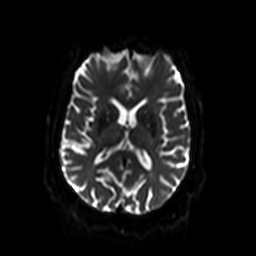
[im 96/160]
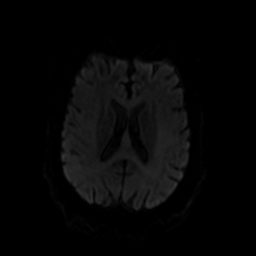
[im 107/160]
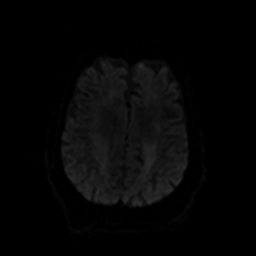
[im 117/160]
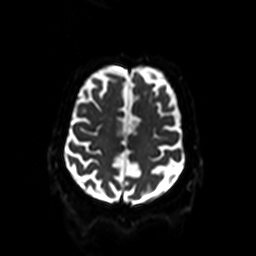
[im 128/160]
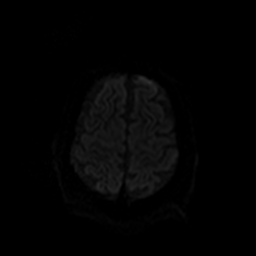
[im 138/160]
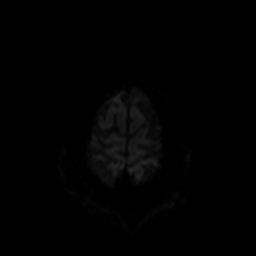
[im 149/160]
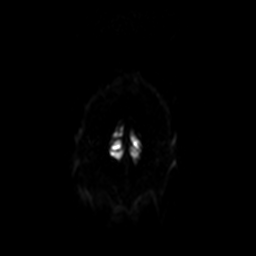
[im 160/160]
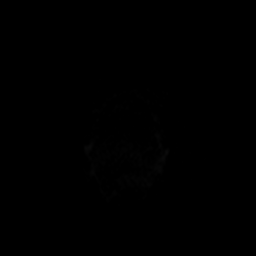

[Series 7: ax dwi_tracew · axial · 3.0mm · 0.94mm/px · z∈[-83,+57]mm · 8 of 80 slices shown]
[im 1/80]
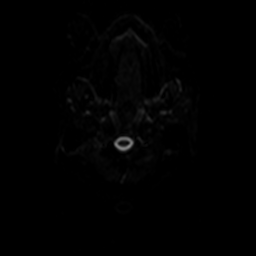
[im 12/80]
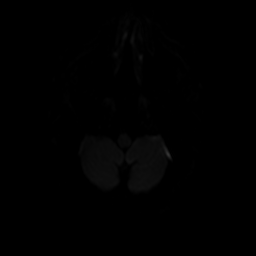
[im 23/80]
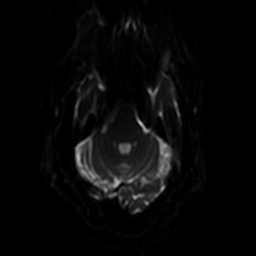
[im 34/80]
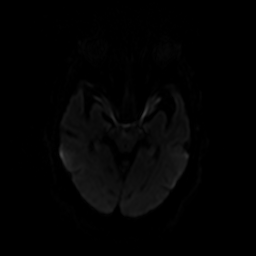
[im 46/80]
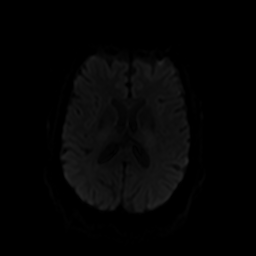
[im 57/80]
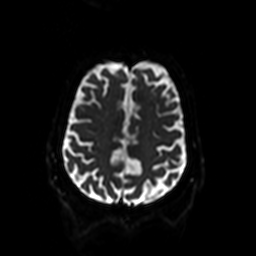
[im 68/80]
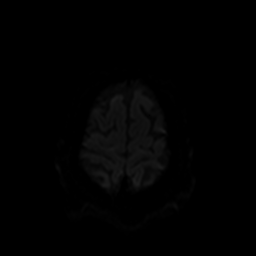
[im 80/80]
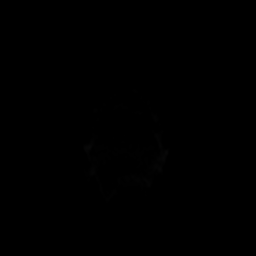

[Series 8: ax dwi_adc · axial · 3.0mm · 0.94mm/px · z∈[-83,+57]mm · 4 of 40 slices shown]
[im 1/40]
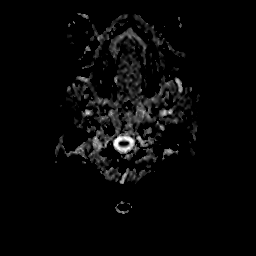
[im 14/40]
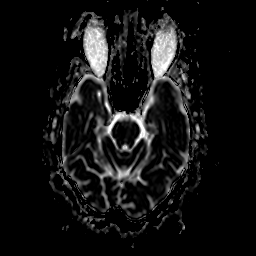
[im 27/40]
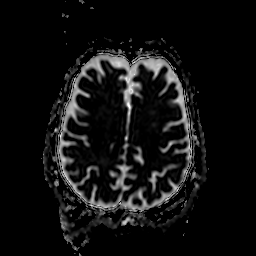
[im 40/40]
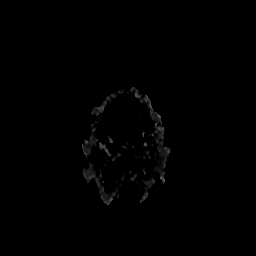

[Series 9: DWI · coronal · 5.0mm · 1.44mm/px · 6 of 64 slices shown (2 of 3)]
[im 1/64]
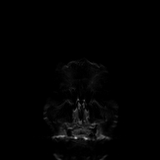
[im 13/64]
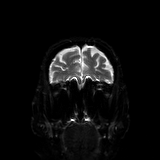
[im 26/64]
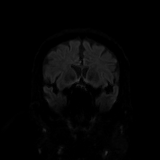
[im 38/64]
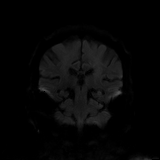
[im 51/64]
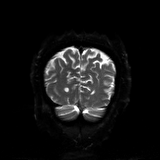
[im 64/64]
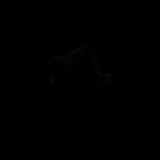

[Series 10: DWI · coronal · 5.0mm · 1.44mm/px · 3 of 32 slices shown (3 of 3)]
[im 1/32]
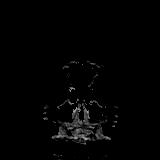
[im 16/32]
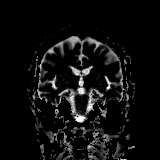
[im 32/32]
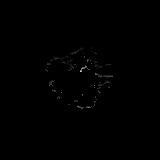

[Series 11: T2 · axial · 4.0mm · 0.36mm/px · z∈[-89,+57]mm · 3 of 29 slices shown (1 of 2)]
[im 1/29]
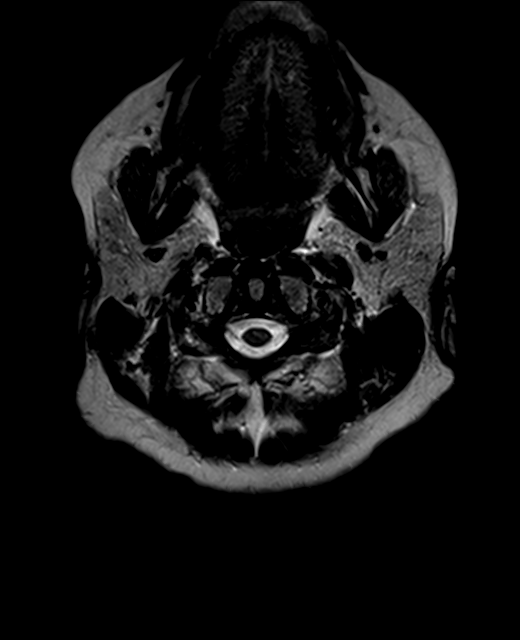
[im 15/29]
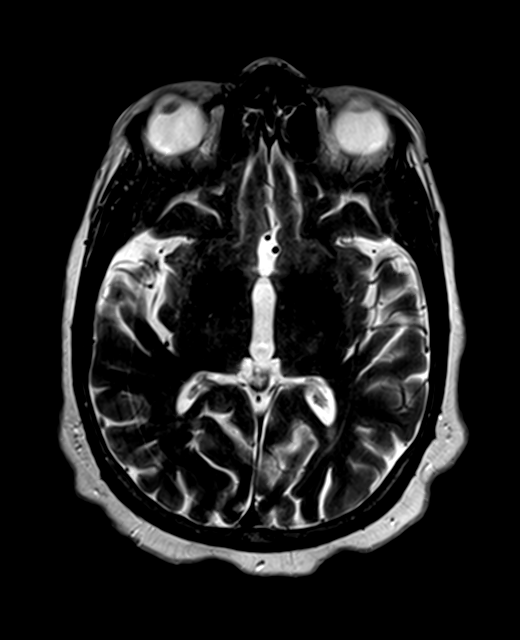
[im 29/29]
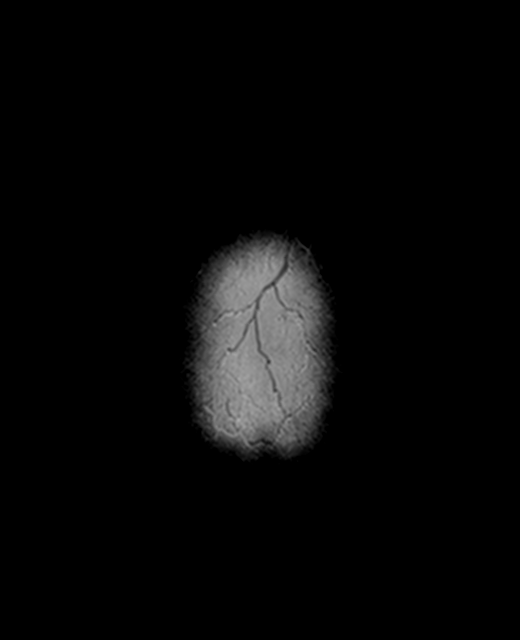

[Series 12: FLAIR · axial · 3.0mm · 0.72mm/px · z∈[-91,+59]mm · 3 of 26 slices shown]
[im 1/26]
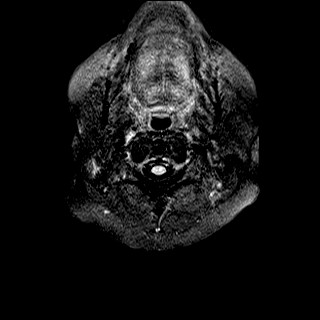
[im 13/26]
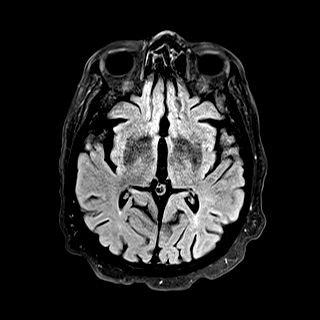
[im 26/26]
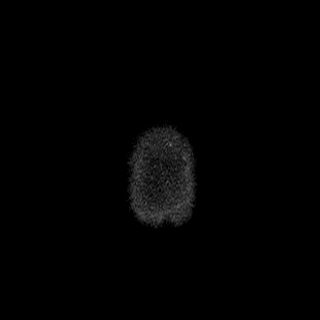

[Series 16: T2 · coronal · 4.5mm · 0.36mm/px · 3 of 30 slices shown (2 of 2)]
[im 1/30]
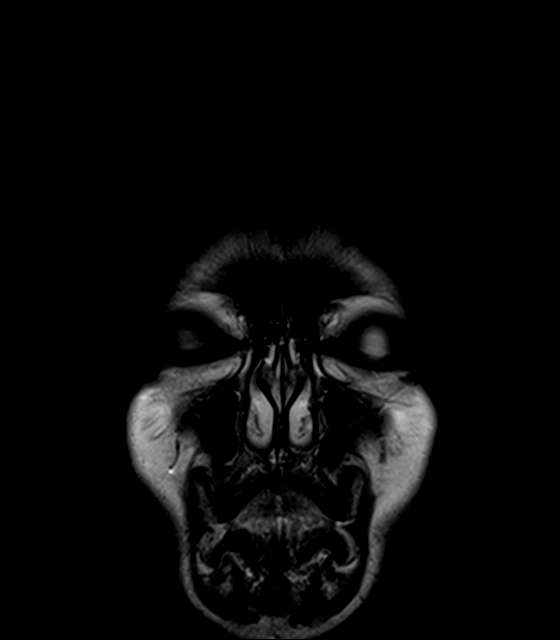
[im 15/30]
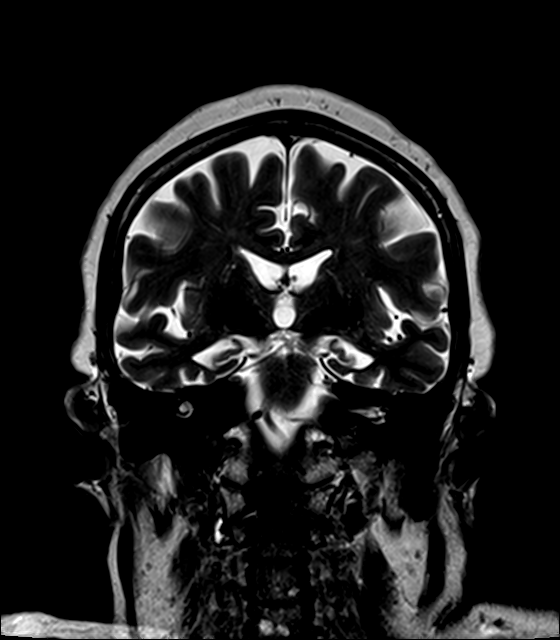
[im 30/30]
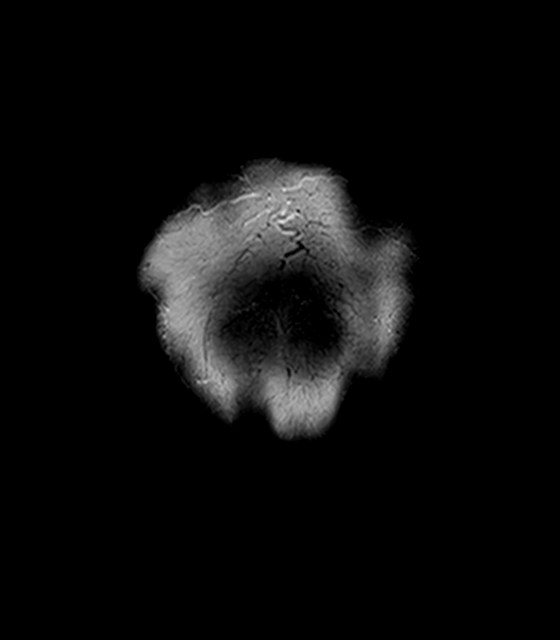

[48 of 48 positions shown; findings below may reference images not displayed]

FINDINGS: Brain:

Mild intermittent motion degradation.

Mild generalized cerebral and cerebellar atrophy.

Mild multifocal T2/FLAIR hyperintensity within the cerebral white
matter, nonspecific but compatible with chronic small vessel
ischemic disease.

There is no acute infarct.

No evidence of an intracranial mass.

No chronic intracranial blood products.

No extra-axial fluid collection.

No midline shift.

Partially empty sella turcica.

Vascular: Expected proximal arterial flow voids.

Skull and upper cervical spine: No focal marrow lesion. Incompletely
assessed cervical spondylosis with multilevel disc space narrowing,
posterior disc osteophytes and uncovertebral hypertrophy.

Sinuses/Orbits: Visualized orbits show no acute finding. Trace
bilateral ethmoid sinus mucosal thickening.
IMPRESSION: No evidence of acute intracranial abnormality.

Mild chronic small vessel ischemic changes within the cerebral white
matter.

Mild generalized cerebral and cerebellar atrophy.

Incompletely assessed cervical spondylosis.

## 2023-03-18 ENCOUNTER — Encounter: Payer: Self-pay | Admitting: Psychology

## 2023-03-19 ENCOUNTER — Ambulatory Visit (INDEPENDENT_AMBULATORY_CARE_PROVIDER_SITE_OTHER): Payer: Medicare HMO | Admitting: Psychology

## 2023-03-19 ENCOUNTER — Ambulatory Visit: Payer: BC Managed Care – PPO | Admitting: Psychology

## 2023-03-19 ENCOUNTER — Encounter: Payer: Self-pay | Admitting: Psychology

## 2023-03-19 DIAGNOSIS — G309 Alzheimer's disease, unspecified: Secondary | ICD-10-CM

## 2023-03-19 DIAGNOSIS — F0280 Dementia in other diseases classified elsewhere without behavioral disturbance: Secondary | ICD-10-CM | POA: Diagnosis not present

## 2023-03-19 DIAGNOSIS — R4189 Other symptoms and signs involving cognitive functions and awareness: Secondary | ICD-10-CM

## 2023-03-19 NOTE — Progress Notes (Unsigned)
NEUROPSYCHOLOGICAL EVALUATION [place]. [facility] [place] Department of Neurology  Date of Evaluation: March 19, 2023  Reason for Referral:   Pamela Key is a 82 y.o. 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   Assessment and Plan:   Clinical Impression(s): Pamela Key pattern of performance is suggestive of severe impairment surrounding all aspects of learning and memory. Further performance variability was exhibited across confrontation naming. A relative weakness was further exhibited across semantic fluency relative to its phonemic counterpart. Performances were appropriate relative to age-matched peers across processing speed, basic attention, cognitive flexibility, verbal fluency, and visuospatial abilities. Functionally, Pamela Key denied all concerns. However, her description does notably contradict family report and the report of other medical providers which suggest at least some difficulties performing activities of daily living due to ongoing cognitive impairment. I do not feel that Pamela Key has a full appreciation into the extent of ongoing impairment. Given the degree of current cognitive impairment and the likelihood that it is directly interfering with day-to-day dysfunction, she continues to best meet diagnostic criteria for a Major Neurocognitive Disorder ("dementia") at the present time.  Relative to her previous evaluation in October 2022, decline was exhibited across semantic fluency. Severe amnestic memory performances were exhibited across both evaluations. Outside of these domains, general stability was elsewhere exhibited.   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 This remains an accurate conceptualization in my opinion and I agree with this prior theorization. Across current memory testing, Pamela Key was fully amnestic (i.e., 0% retention) after brief delays and performed very poorly across yes/no recognition trials. The same patterns of severe memory impairment were previously seen across testing. Taken together, this continues to suggest evidence for rapid forgetting and severe storage impairment, both of which are the hallmark testing characteristics of this illness. Weakness across confrontation naming and a noted discrepancy between phonemic and semantic fluency performances (with the latter exhibiting diminished performance and evidence for objective decline over time) would further represent typical disease progression.   Recommendations: Pamela Key has already been prescribed a medication aimed to address memory loss and concerns surrounding Alzheimer's disease (i.e., donepezil/Aricept). She is encouraged to continue taking this medication as prescribed. It is important to highlight that this medication has been shown to slow functional decline in some individuals. There is no current treatment which can stop or reverse cognitive decline when caused by a neurodegenerative illness.   Performance across neurocognitive testing is not a strong predictor of an individual's safety operating a motor vehicle. Should her family wish to pursue a formalized driving evaluation, they could reach out to the following agencies: [employer] in [place]: [phone] [facility]: [phone] [facility]: [phone] [facility]: [phone] or [phone]  Should there be progression of current deficits over time, Pamela Key is unlikely to regain any  independent living skills lost. Therefore, it is recommended that she remain as involved as possible in all aspects of household chores, finances, and medication management, with supervision to ensure adequate performance. She will likely benefit from the establishment and maintenance of a routine in order to maximize her functional abilities over time.  It  will be important for Pamela Key to have another person with her when in situations where she may need to process information, weigh the pros and cons of different options, and make decisions, in order to ensure that she fully understands and recalls all information to be considered.  If not already done, Pamela Key and her family may want to discuss her wishes regarding durable power of attorney and medical decision making, so that she can have input into these choices. If they require legal assistance with this, long-term care resource access, or other aspects of estate planning, they could reach out to [employer] at [phone] for a free consultation. Additionally, they may wish to discuss future plans for caretaking and seek out community options for in home/residential care should they become necessary.  Pamela Key is encouraged to attend to lifestyle factors for brain health (e.g., regular physical exercise, good nutrition habits and consideration of the MIND-DASH diet, regular participation in cognitively-stimulating activities, and general stress management techniques), which are likely to have benefits for both emotional adjustment and cognition. Optimal control of vascular risk factors (including safe cardiovascular exercise and adherence to dietary recommendations) is encouraged. Continued participation in activities which provide mental stimulation and social interaction is also recommended.   Important information should be provided to Ms. Younge in written format in all instances. This information should be placed in a  highly frequented and easily visible location within her home to promote recall. External strategies such as written notes in a consistently used memory journal, visual and nonverbal auditory cues such as a calendar on the refrigerator or appointments with alarm, such as on a cell phone, can also help maximize recall.  Review of Records:   Pamela Key completed a comprehensive neuropsychological evaluation Clayborn Heron, [name]) on 02/27/2021. Results suggested severe memory impairments with additional impairment surrounding confrontation naming. Further weaknesses were noted across complex attention and working memory. Memory patterns were said to be worrisome for underlying Alzheimer's disease. She was diagnosed with a mild dementia presentation most likely due to Alzheimer's disease. There were also expressed concern surrounding Pamela Key having diminished insight into the extent of ongoing impairment.   Pamela Key has followed with [facility] [name], [place]) since 11/11/2020 and most recently met with her on 10/04/2022. At that appointment, cognition was described as seeming stable. Functionally, records suggested family involvement with all ADLs and that she no longer drives. Performance on a brief cognitive screening instrument (MMSE) was 26/30 back in November 2023. Ultimately, Pamela Key was referred for a repeat neuropsychological evaluation to characterize her cognitive abilities and to assist with diagnostic clarity and treatment planning.   Neuroimaging Brain MRI on 11/21/2020 was mildly motion degraded and revealed mild generalized cerebral and cerebellar atrophy, as well as mild microvascular ischemic disease.   Past Medical History:  Diagnosis Date   Bunion of great toe of right foot 01/12/2021   Increased pain and request to see podiatrist to look at big toe on right foot.     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   Internal hemorrhoids without complication 12/21/2022   Morbid obesity 06/17/2018   Pure hypercholesterolemia 06/17/2018   Type 2 diabetes mellitus, without long-term current use of insulin    Vitamin  B12 deficiency     Past Surgical History:  Procedure Laterality Date   TUBAL LIGATION  1976    Current Outpatient Medications:    allopurinol (ZYLOPRIM) 100 MG tablet, Take 1 tablet (100 mg total) by mouth daily., Disp: 90 tablet, Rfl: 1   Blood Glucose Monitoring Suppl (ACCU-CHEK GUIDE) w/Device KIT, Use to check blood sugars twice daily E11.65, Disp: 1 kit, Rfl: 1   donepezil (ARICEPT) 10 MG tablet, Take 1 tab 10 mg daily, Disp: 30 tablet, Rfl: 11   ferrous sulfate 325 (65 FE) MG tablet, Take 325 mg by mouth See admin instructions. Take one tablet (325 mg) by mouth daily Monday thru Friday (skip Saturday and Sunday), Disp: , Rfl:    glucose blood (ACCU-CHEK GUIDE) test strip, Use to check blood sugars twice daily E11.65, Disp: 100 each, Rfl: 2   glucose blood test strip, Use as instructed to check blood sugars twice daily, Disp: 100 each, Rfl: 2   Misc Natural Products (BLOOD SUGAR BALANCE PO), Take 1 capsule by mouth daily. , Disp: , Rfl:    Multiple Vitamin (MULTIVITAMIN WITH MINERALS) TABS tablet, Take 1 tablet by mouth daily with breakfast., Disp: , Rfl:    NON FORMULARY, daily as needed. CBD capsules Pure organic hemp extract, Disp: , Rfl:    pravastatin (PRAVACHOL) 40 MG tablet, One tab po M-F and skip Saturday/Sundays, Disp: 75 tablet, Rfl: 1   valsartan (DIOVAN) 160 MG tablet, Take 1 tablet (160 mg total) by mouth daily., Disp: 90 tablet, Rfl: 1  Clinical Interview:   The following information was obtained during a clinical interview with Pamela Key and her grandson prior to cognitive testing.  Cognitive  Symptoms: Currently, Pamela Key denied all cognitive concerns across all inquired domains. She also denied her perception of any progressive decline relative to her previous October 2022 evaluation. Historically, family have raised significant concerns surrounding progressive cognitive decline. According to her daughter [name], Pamela Key is extremely repetitive in day-to-day conversation and has trouble with very rapid forgetting. The latter can sometimes occur within only a few minutes. Family has also expressed concern surrounding increased word finding. There have also been some personality changes surrounding increased agitation. Ms. Lanum may become angry and aggressive when her family speak about her memory and other cognitive abilities. There has been mention of her throwing things or attempting to hit family members with her cane in the past. Her grandson, who accompanied her to the current appointment, noted significant repetition in day-to-day conversation but was unsure regarding the idea of progressive cognitive decline over time.   Difficulties completing ADLs: Denied. She lives with her son. Per her report, this is because he needed her assistance and she happened to have ane extra bedroom. Per Ms. Wadel, she is fully independent and continues to drive without issue. However, medical records greatly contradict this reporting. There have been medication adherence concerns for several years due to increased forgetfulness and records suggest that her family/case manager have taken over medication management to at least a significant degree. Financially, her bank account has reportedly been compromised on several occasions and family has suspected that she previously fell victim to several scams. Her car was reportedly repossessed prior to her 2022 evaluation due to her having trouble keeping up with the payments. Ms. Camila Li most recent notes in May 2024 suggest that she no longer drives.     Additional Medical History: History of traumatic brain injury/concussion: Denied. History of stroke: Denied. History of seizure activity:  Denied. History of known exposure to toxins: Denied. Symptoms of chronic pain: Denied. Experience of frequent headaches/migraines: Denied. Frequent instances of dizziness/vertigo: Denied.  Sensory changes: Denied.  Balance/coordination difficulties: Denied. She ambulates with a cane. Per her report, this is more precautionary and there are instances where she does not use it when out and about. She denied any recent falls.  Other motor difficulties: Denied.  Sleep History: Estimated hours obtained each night: 8 hours.  Difficulties falling asleep: Denied. Difficulties staying asleep: Denied. Feels rested and refreshed upon awakening: Endorsed.  History of snoring: Denied. History of waking up gasping for air: Denied. Witnessed breath cessation while asleep: Denied.  History of vivid dreaming: Denied. Excessive movement while asleep: Denied. Instances of acting out her dreams: Denied.  Psychiatric/Behavioral Health History: Depression: She described her current mood as "good" and denied to her knowledge any prior mental health concerns or formal diagnoses. Current or remote suicidal ideation, intent, or plan was denied.  Anxiety: Denied. Mania: Denied. Trauma History: Denied. Visual/auditory hallucinations: Denied. Delusional thoughts: Denied.  Tobacco: Denied. Alcohol: She reported consuming a glass of wine several nights per week and denied a history of problematic alcohol abuse or dependence.  Recreational drugs: Denied.  Family History: Problem Relation Age of Onset   Diabetes Mother    Hypertension Mother    Cancer Father    Hypertension Father    Breast cancer Neg Hx    This information was confirmed by Ms. Lubin.  Academic/Vocational History: Highest level of educational attainment: 18 years. 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 She described herself as a Designer, multimedia in academic settings. No relative weaknesses were identified. She grew up in [place] and [place]. Her primary language is [place].  History of developmental delay: Denied. History of grade repetition: Denied. Enrollment in special education courses: Denied. History of LD/ADHD: Denied.  Employment: Retired. 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   Evaluation Results:   Behavioral Observations: Ms. Plotner was accompanied by her grandson, arrived to her appointment on time, and was appropriately dressed and groomed. She appeared alert. She ambulated slowly and did appear to benefit from her use of a cane while ambulating to provide further stabilization. Gross motor functioning appeared intact upon informal observation and no abnormal movements (e.g., tremors) were noted. Her affect was generally relaxed and positive. Spontaneous speech was fluent and word finding difficulties were not observed during the clinical interview. Repetition was very pronounced during interview. 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 She did not appear to recognize ongoing repetition as it was occurring. Thought processes were otherwise coherent, organized, and normal in content. Insight into her cognitive difficulties appeared very poor in that she denied all cognitive concerns despite both prior and current testing revealing quite severe cognitive impairment.   During testing, sustained attention was adequate initially. However, after starting memory testing, Ms. Sausedo abruptly starting describing  severe pain attributed to a bunion on her foot. The timing of this was quite curious as she had denied all pain-relayed symptoms or conditions during interview minutes beforehand. She then became extremely repetitive, describing her bunion upwards of 7-8 times in a very short period  of time without any apparent knowledge that this had previously been shared before. Due to this (and her concurrent report of increased fatigue), the evaluation had to be abbreviated.  Adequacy of Effort: The validity of neuropsychological testing is limited by the extent to which the individual being tested may be assumed to have exerted adequate effort during testing. Ms. Bauer expressed her intention to perform to the best of her abilities and exhibited adequate task engagement and persistence. Scores across stand-alone and embedded performance validity measures were within expectation. As such, the results of the current evaluation are believed to be a valid representation of Ms. Mcginn's current cognitive functioning.  Test Results: Ms. Yoshida was mildly disoriented at the time of the current evaluation. She was unable to state the current date or estimate the current time.   Intellectual abilities based upon educational and vocational attainment were estimated to be in the average to above average range. Premorbid abilities were estimated to be within the below average range based upon a single-word reading test.   Processing speed was average. Basic attention was average. More complex attention (e.g., working memory) was unable to be assessed due to testing tolerance concerns. Cognitive flexibility was average. Other aspects of executive functioning were unable to be assessed.  Receptive language abilities were unable to be assessed. Ms. Campus did not exhibit pronounced difficulties comprehending task instructions and answered all questions asked of her appropriately. Assessed expressive language was  variable. Phonemic fluency was average, semantic fluency was below average to average, and confrontation naming was average across a screening task but well below average across a more comprehensive task.   Assessed visuospatial/visuoconstructional abilities were average to well above average.    Learning (i.e., encoding) of novel verbal information was exceptionally low to well below average. Spontaneous delayed recall (i.e., retrieval) of previously learned information was exceptionally low. Retention rates were 0% across a story learning task, 0% across a list learning task, and 0% across a figure drawing task. Performance across recognition tasks was exceptionally low to well below average, suggesting negligible evidence for information consolidation.   Results of emotional screening instruments suggested that recent symptoms of generalized anxiety were in the minimal range, while symptoms of depression were within normal limits. A screening instrument assessing recent sleep quality suggested the presence of minimal sleep dysfunction.  Tables of Scores:   Note: This summary of test scores accompanies the interpretive report and should not be considered in isolation without reference to the appropriate sections in the text. Descriptors are based on appropriate normative data and may be adjusted based on clinical judgment. Terms such as "Within Normal Limits" and "Outside Normal Limits" are used when a more specific description of the test score cannot be determined. Descriptors refer to the current evaluation only.        Percentile - Normative Descriptor > 98 - Exceptionally High 91-97 - Well Above Average 75-90 - Above Average 25-74 - Average 9-24 - Below Average 2-8 - Well Below Average < 2 - Exceptionally Low        Validity: October 2022 Current  DESCRIPTOR        DCT: --- --- --- Within Normal Limits  RBANS EI: --- --- --- Within Normal Limits        Orientation:       Raw Score  Raw Score Percentile   NAB Orientation, Form 1 --- 25/29 --- ---        Cognitive Screening:  Raw Score Raw Score Percentile   MOCA: 19/30 --- --- ---  SLUMS: --- 16/30 --- ---        RBANS, Form A: Standard Score/ Scaled Score Standard Score/ Scaled Score Percentile   Total Score --- 79 8 Well Below Average  Immediate Memory --- 65 1 Exceptionally Low    List Learning --- 5 5 Well Below Average    Story Memory --- 3 1 Exceptionally Low  Visuospatial/Constructional 102 109 73 Average    Figure Copy 12 14 91 Well Above Average    Line Orientation 16/20 15/20 26-50 Average  Language --- 99 47 Average    Picture Naming --- 9/10 26-50 Average    Semantic Fluency --- 9 37 Average  Attention --- 97 42 Average    Digit Span --- 9 37 Average    Coding 8 10 50 Average  Delayed Memory --- 48 <1 Exceptionally Low    List Recall --- 0/10 <2 Exceptionally Low    List Recognition --- 14/20 <2 Exceptionally Low    Story Recall --- 1 <1 Exceptionally Low    Story Recognition --- 5/12 1-4 Exceptionally Low to Well Below Average    Figure Recall 1 1 <1 Exceptionally Low    Figure Recognition --- 0/8 <1 Exceptionally Low         Intellectual Functioning:       Standard Score Standard Score Percentile   Test of Premorbid Functioning: --- 86 18 Below Average        Attention/Executive Function:      Trail Making Test (TMT): T Score Raw Score (T Score) Percentile     Part A 43 47 secs.,  0 errors (45) 31 Average    Part B 43 123 secs.,  1 error (49) 46 Average         Language:      Verbal Fluency Test: T Score Raw Score (T Score) Percentile     Phonemic Fluency (FAS) 44 32 (46) 34 Average    Animal Fluency 50 10 (37) 9 Below Average         NAB Language Module, Form 1: T Score T Score Percentile     Naming 25/31 (29) 24/31 (30) 2 Well Below Average        Visuospatial/Visuoconstruction:       Raw Score Raw Score Percentile   Clock Drawing: 9/10 10/10 --- Within Normal Limits         Mood and Personality:       Raw Score Raw Score Percentile   Geriatric Depression Scale: --- 2 --- Within Normal Limits  Geriatric Anxiety Scale: --- 3 --- Minimal    Somatic --- 2 --- Minimal    Cognitive --- 0 --- Minimal    Affective --- 1 --- Minimal        Additional Questionnaires:       Raw Score Raw Score Percentile   PROMIS Sleep Disturbance Questionnaire: --- 8 --- None to Slight   Informed Consent and Coding/Compliance:   The current evaluation represents a clinical evaluation for the purposes previously outlined by the referral source and is in no way reflective of a forensic evaluation.   Ms. Wernli was provided with a verbal description of the nature and purpose of the present neuropsychological evaluation. Also reviewed were the foreseeable risks and/or discomforts and benefits of the procedure, limits of confidentiality, and mandatory reporting requirements of this provider. The patient was given the opportunity to ask questions and receive answers  about the evaluation. Oral consent to participate was provided by the patient.   This evaluation was conducted by Newman Nickels, Ph.D., ABPP-CN, board certified clinical neuropsychologist. Ms. Luckett completed a clinical interview with Dr. Milbert Coulter, billed as one unit [phone], and 95 minutes of cognitive testing and scoring, billed as one unit [phone] and two additional units [id number]. Psychometrist Wallace Keller, B.S. assisted Dr. Milbert Coulter with test administration and scoring procedures. As a separate and discrete service, one unit [id number] and two units [phone] were billed for Dr. Tammy Sours time spent in interpretation and report writing.

## 2023-03-19 NOTE — Progress Notes (Signed)
   Psychometrician Note   Cognitive testing was administered to Pamela Key by Wallace Keller, B.S. (psychometrist) under the supervision of Dr. Newman Nickels, Ph.D., licensed psychologist on 03/19/2023. [name] did not appear overtly distressed by the testing session per behavioral observation or responses across self-report questionnaires. Rest breaks were offered.    The battery of tests administered was selected by Dr. Newman Nickels, Ph.D. with consideration to [name]'s current level of functioning, the nature of her symptoms, emotional and behavioral responses during interview, level of literacy, observed level of motivation/effort, and the nature of the referral question. This battery was communicated to the psychometrist. Communication between Dr. Newman Nickels, Ph.D. and the psychometrist was ongoing throughout the evaluation and Dr. Newman Nickels, Ph.D. was immediately accessible at all times. Dr. Newman Nickels, Ph.D. provided supervision to the psychometrist on the date of this service to the extent necessary to assure the quality of all services provided.    Pamela Key will return within approximately 1-2 weeks for an interactive feedback session with Dr. Milbert Coulter at which time her test performances, clinical impressions, and treatment recommendations will be reviewed in detail. [name] understands she can contact our office should she require our assistance before this time.  A total of 95 minutes of billable time were spent face-to-face with [name] by the psychometrist. This includes both test administration and scoring time. Billing for these services is reflected in the clinical report generated by Dr. Newman Nickels, Ph.D.  This note reflects time spent with the psychometrician and does not include test scores or any clinical interpretations made by Dr. Milbert Coulter. The full report will follow in a separate note.

## 2023-03-26 ENCOUNTER — Ambulatory Visit (INDEPENDENT_AMBULATORY_CARE_PROVIDER_SITE_OTHER): Payer: Medicare HMO | Admitting: Psychology

## 2023-03-26 ENCOUNTER — Ambulatory Visit: Payer: BC Managed Care – PPO | Admitting: Physician Assistant

## 2023-03-26 DIAGNOSIS — F0280 Dementia in other diseases classified elsewhere without behavioral disturbance: Secondary | ICD-10-CM

## 2023-03-26 DIAGNOSIS — G309 Alzheimer's disease, unspecified: Secondary | ICD-10-CM | POA: Diagnosis not present

## 2023-03-26 NOTE — Progress Notes (Signed)
   Neuropsychology Feedback Session [name]. [facility] Department of Neurology  Reason for Referral:   Pamela Key is a 82 y.o. 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   Feedback:   [name] completed a comprehensive neuropsychological evaluation on 03/19/2023. Please refer to that encounter for the full report and recommendations. Briefly, results suggested severe impairment surrounding all aspects of learning and memory, while performance variability was exhibited across confrontation naming. A relative weakness was further exhibited across semantic fluency relative to its phonemic counterpart. Relative to her previous evaluation in October 2022, decline was exhibited across semantic fluency. Severe amnestic memory performances were exhibited across both evaluations. Outside of these domains, general stability was elsewhere exhibited. 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 This remains an accurate conceptualization in my opinion and I agree with this prior theorization. Across current memory testing, [name] was fully amnestic (i.e., 0% retention) after brief delays and performed very poorly across yes/no recognition trials. The same patterns of severe memory impairment were previously seen across testing. Taken together, this continues to suggest evidence for rapid forgetting and severe storage impairment, both of which are the hallmark testing characteristics of this illness.   [name] was accompanied by her son during  the current feedback session. Content of the current session focused on the results of her neuropsychological evaluation. [name] was given the opportunity to ask questions and her questions were answered. She was encouraged to reach out should additional questions arise. A copy of her report was provided at the conclusion of the visit.      One unit [phone] was billed for Dr. Tammy Sours time spent preparing for, conducting, and documenting the current feedback session with Ms. Wenz.

## 2023-04-01 ENCOUNTER — Encounter: Payer: Self-pay | Admitting: Physician Assistant

## 2023-04-01 ENCOUNTER — Ambulatory Visit (INDEPENDENT_AMBULATORY_CARE_PROVIDER_SITE_OTHER): Payer: Medicare HMO | Admitting: Physician Assistant

## 2023-04-01 VITALS — BP 190/95 | HR 77 | Resp 20 | Ht 64.0 in

## 2023-04-01 DIAGNOSIS — F0280 Dementia in other diseases classified elsewhere without behavioral disturbance: Secondary | ICD-10-CM | POA: Diagnosis not present

## 2023-04-01 DIAGNOSIS — G309 Alzheimer's disease, unspecified: Secondary | ICD-10-CM | POA: Diagnosis not present

## 2023-04-01 MED ORDER — DONEPEZIL HCL 10 MG PO TABS: ORAL_TABLET | ORAL | 11 refills | Status: DC

## 2023-04-01 NOTE — Patient Instructions (Signed)
It was a pleasure to see you today at our office.   Recommendations:  Meds:  Follow up in  6 months Resume Donepezil 10 daily          RECOMMENDATIONS FOR ALL PATIENTS WITH MEMORY PROBLEMS: 1. Continue to exercise (Recommend 30 minutes of walking everyday, or 3 hours every week) 2. Increase social interactions - continue going to [place] and enjoy social gatherings with friends and family 3. Eat healthy, avoid fried foods and eat more fruits and vegetables 4. Maintain adequate blood pressure, blood sugar, and blood cholesterol level. Reducing the risk of stroke and cardiovascular disease also helps promoting better memory. 5. Avoid stressful situations. Live a simple life and avoid aggravations. Organize your time and prepare for the next day in anticipation. 6. Sleep well, avoid any interruptions of sleep and avoid any distractions in the bedroom that may interfere with adequate sleep quality 7. Avoid sugar, avoid sweets as there is a strong link between excessive sugar intake, diabetes, and cognitive impairment We discussed the Mediterranean diet, which has been shown to help patients reduce the risk of progressive memory disorders and reduces cardiovascular risk. This includes eating fish, eat fruits and green leafy vegetables, nuts like almonds and hazelnuts, walnuts, and also use olive oil. Avoid fast foods and fried foods as much as possible. Avoid sweets and sugar as sugar use has been linked to worsening of memory function.  There is always a concern of gradual progression of memory problems. If this is the case, then we may need to adjust level of care according to patient needs. Support, both to the patient and caregiver, should then be put into place.    The Alzheimer's Association is here all day, every day for people facing Alzheimer's disease through our free 24/7 Helpline: 907 662 1439. The Helpline provides reliable information and support to all those who need assistance,  such as individuals living with memory loss, Alzheimer's or other dementia, caregivers, health care professionals and the public.  Our highly trained and knowledgeable staff can help you with: Understanding memory loss, dementia and Alzheimer's  Medications and other treatment options  General information about aging and brain health  Skills to provide quality care and to find the best care from professionals  Legal, financial and living-arrangement decisions Our Helpline also features: Confidential care consultation provided by master's level clinicians who can help with decision-making support, crisis assistance and education on issues families face every day  Help in a caller's preferred language using our translation service that features more than 200 languages and dialects  Referrals to local community programs, services and ongoing support     FALL PRECAUTIONS: Be cautious when walking. Scan the area for obstacles that may increase the risk of trips and falls. When getting up in the mornings, sit up at the edge of the bed for a few minutes before getting out of bed. Consider elevating the bed at the head end to avoid drop of blood pressure when getting up. Walk always in a well-lit room (use night lights in the walls). Avoid area rugs or power cords from appliances in the middle of the walkways. Use a walker or a cane if necessary and consider physical therapy for balance exercise. Get your eyesight checked regularly.  FINANCIAL OVERSIGHT: Supervision, especially oversight when making financial decisions or transactions is also recommended.  HOME SAFETY: Consider the safety of the kitchen when operating appliances like stoves, microwave oven, and blender. Consider having supervision and share cooking responsibilities until  no longer able to participate in those. Accidents with firearms and other hazards in the house should be identified and addressed as well.   ABILITY TO BE LEFT ALONE:  If patient is unable to contact 911 operator, consider using LifeLine, or when the need is there, arrange for someone to stay with patients. Smoking is a fire hazard, consider supervision or cessation. Risk of wandering should be assessed by caregiver and if detected at any point, supervision and safe proof recommendations should be instituted.  MEDICATION SUPERVISION: Inability to self-administer medication needs to be constantly addressed. Implement a mechanism to ensure safe administration of the medications.          Mediterranean Diet A Mediterranean diet refers to food and lifestyle choices that are based on the traditions of countries located on the [employer]. This way of eating has been shown to help prevent certain conditions and improve outcomes for people who have chronic diseases, like kidney disease and heart disease. What are tips for following this plan? Lifestyle  Cook and eat meals together with your family, when possible. Drink enough fluid to keep your urine clear or pale yellow. Be physically active every day. This includes: Aerobic exercise like running or swimming. Leisure activities like gardening, walking, or housework. Get 7-8 hours of sleep each night. If recommended by your health care provider, drink red wine in moderation. This means 1 glass a day for nonpregnant women and 2 glasses a day for men. A glass of wine equals 5 oz (150 mL). Reading food labels  Check the serving size of packaged foods. For foods such as rice and pasta, the serving size refers to the amount of cooked product, not dry. Check the total fat in packaged foods. Avoid foods that have saturated fat or trans fats. Check the ingredients list for added sugars, such as corn syrup. Shopping  At the grocery store, buy most of your food from the areas near the walls of the store. This includes: Fresh fruits and vegetables (produce). Grains, beans, nuts, and seeds. Some of these may be  available in unpackaged forms or large amounts (in bulk). Fresh seafood. Poultry and eggs. Low-fat dairy products. Buy whole ingredients instead of prepackaged foods. Buy fresh fruits and vegetables in-season from local farmers markets. Buy frozen fruits and vegetables in resealable bags. If you do not have access to quality fresh seafood, buy precooked frozen shrimp or canned fish, such as tuna, salmon, or sardines. Buy small amounts of raw or cooked vegetables, salads, or olives from the deli or salad bar at your store. Stock your pantry so you always have certain foods on hand, such as olive oil, canned tuna, canned tomatoes, rice, pasta, and beans. Cooking  Cook foods with extra-virgin olive oil instead of using butter or other vegetable oils. Have meat as a side dish, and have vegetables or grains as your main dish. This means having meat in small portions or adding small amounts of meat to foods like pasta or stew. Use beans or vegetables instead of meat in common dishes like chili or lasagna. Experiment with different cooking methods. Try roasting or broiling vegetables instead of steaming or sauteing them. Add frozen vegetables to soups, stews, pasta, or rice. Add nuts or seeds for added healthy fat at each meal. You can add these to yogurt, salads, or vegetable dishes. Marinate fish or vegetables using olive oil, lemon juice, garlic, and fresh herbs. Meal planning  Plan to eat 1 vegetarian meal one day each  week. Try to work up to 2 vegetarian meals, if possible. Eat seafood 2 or more times a week. Have healthy snacks readily available, such as: Vegetable sticks with hummus. Greek yogurt. Fruit and nut trail mix. Eat balanced meals throughout the week. This includes: Fruit: 2-3 servings a day Vegetables: 4-5 servings a day Low-fat dairy: 2 servings a day Fish, poultry, or lean meat: 1 serving a day Beans and legumes: 2 or more servings a week Nuts and seeds: 1-2 servings a  day Whole grains: 6-8 servings a day Extra-virgin olive oil: 3-4 servings a day Limit red meat and sweets to only a few servings a month What are my food choices? Mediterranean diet Recommended Grains: Whole-grain pasta. Brown rice. Bulgar wheat. Polenta. Couscous. Whole-wheat bread. Orpah Cobb. Vegetables: Artichokes. Beets. Broccoli. Cabbage. Carrots. Eggplant. Green beans. Chard. Kale. Spinach. Onions. Leeks. Peas. Squash. Tomatoes. Peppers. Radishes. Fruits: Apples. Apricots. Avocado. Berries. Bananas. Cherries. Dates. Figs. Grapes. Lemons. Melon. Oranges. Peaches. Plums. Pomegranate. Meats and other protein foods: Beans. Almonds. Sunflower seeds. Pine nuts. Peanuts. Cod. Salmon. Scallops. Shrimp. Tuna. Tilapia. Clams. Oysters. Eggs. Dairy: Low-fat milk. Cheese. Greek yogurt. Beverages: Water. Red wine. Herbal tea. Fats and oils: Extra virgin olive oil. Avocado oil. Grape seed oil. Sweets and desserts: [place] yogurt with honey. Baked apples. Poached pears. Trail mix. Seasoning and other foods: Basil. Cilantro. Coriander. Cumin. Mint. Parsley. Sage. Rosemary. Tarragon. Garlic. Oregano. Thyme. Pepper. Balsalmic vinegar. Tahini. Hummus. Tomato sauce. Olives. Mushrooms. Limit these Grains: Prepackaged pasta or rice dishes. Prepackaged cereal with added sugar. Vegetables: Deep fried potatoes (french fries). Fruits: Fruit canned in syrup. Meats and other protein foods: Beef. Pork. Lamb. Poultry with skin. Hot dogs. Tomasa Blase. Dairy: Ice cream. Sour cream. Whole milk. Beverages: Juice. Sugar-sweetened soft drinks. Beer. Liquor and spirits. Fats and oils: Butter. Canola oil. Vegetable oil. Beef fat (tallow). Lard. Sweets and desserts: Cookies. Cakes. Pies. Candy. Seasoning and other foods: Mayonnaise. Premade sauces and marinades. The items listed may not be a complete list. Talk with your dietitian about what dietary choices are right for you. Summary The Mediterranean diet includes both  food and lifestyle choices. Eat a variety of fresh fruits and vegetables, beans, nuts, seeds, and whole grains. Limit the amount of red meat and sweets that you eat. Talk with your health care provider about whether it is safe for you to drink red wine in moderation. This means 1 glass a day for nonpregnant women and 2 glasses a day for men. A glass of wine equals 5 oz (150 mL). This information is not intended to replace advice given to you by your health care provider. Make sure you discuss any questions you have with your health care provider. Document Released: 12/22/2015 Document Revised: 01/24/2016 Document Reviewed: 12/22/2015 Elsevier Interactive Patient Education  2017 [employer].

## 2023-04-01 NOTE — Progress Notes (Signed)
Assessment/Plan:   Dementia likely due to Alzheimer's disease   Pamela Key is a very pleasant 82 y.o. RH female with a history of hypertension, hyperlipidemia, B12 deficiency requiring injections, CKD CAD and a diagnosis of dementia due to Alzheimer's disease per latest neuropsychological evaluation on 03/19/2023 seen today in follow up for memory loss. Patient is currently on donepezil 10 mg daily but has not been compliant per grandson's report.  Discussed that if she does adhering to the medication, we may consider adding memantine to the regimen for better coverage.  Has been instructed of the importance of adhering to the therapy.  She is able to participate to her ADLs.  She no longer drives.    Follow up in 6 months. Continue donepezil 10 mg daily. Side effects were discussed  Recommend good control of her cardiovascular risk factors.  Patient informed of elevated blood pressure, follow-up with provider. Continue to control mood as per PCP    Subjective:    This patient is accompanied in the office by her grandson  who supplements the history.  Previous records as well as any outside records available were reviewed prior to todays visit. Patient was last seen on 10/04/22 with MMSE 26/30.     Any changes in memory since last visit? " About the same".  She continues having some difficulty remembering names of people, conversations and new information.  Goes to the [employer]. likes to do crossword puzzles and  word finding.  "She likes reading a lot ". repeats oneself?  Endorsed Disoriented when walking into a room?  Patient denies    Leaving objects?  May misplace things but not in unusual places   Wandering behavior?  denies   Any personality changes since last visit?  Denies.    Any worsening depression?:  Denies.   Hallucinations or paranoia?  Denies.   Seizures? denies    Any sleep changes?  Sleeps well. Denies vivid dreams, REM behavior or sleepwalking   Sleep  apnea?   Denies.   Any hygiene concerns? Denies.  Independent of bathing and dressing?  Endorsed  Does the patient needs help with medications?  Patient  is in charge   Who is in charge of the finances?  Patient is in charge     Any changes in appetite?  Denies.     Patient have trouble swallowing? Denies.   Does the patient cook? No  Any headaches?  Denies.   Chronic back pain  denies.   Ambulates with difficulty? Denies. Uses a R cane for stability .     Recent falls or head injuries? Denies.     Unilateral weakness, numbness or tingling? Denies.   Any tremors?  Denies   Any anosmia?  Denies   Any incontinence of urine?  Endorsed, wears diapers Any bowel dysfunction?   Denies      Patient lives with  grandson  Does the patient drive? No longer drives    Neuropsych evaluation 03/19/2023  . Briefly, results suggested severe impairment surrounding all aspects of learning and memory, while performance variability was exhibited across confrontation naming. A relative weakness was further exhibited across semantic fluency relative to its phonemic counterpart. Relative to her previous evaluation in October 2022, decline was exhibited across semantic fluency. Severe amnestic memory performances were exhibited across both evaluations. Outside of these domains, general stability was elsewhere exhibited. 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 This remains an accurate conceptualization in my opinion and I agree with this prior theorization. Across current memory testing, Pamela Key was fully amnestic (i.e., 0% retention) after brief delays and performed very poorly across yes/no recognition trials. The same patterns of severe memory impairment were previously seen across testing. Taken together, this continues to suggest evidence for rapid  forgetting and severe storage impairment, both of which are the hallmark testing characteristics of this illness.     PREVIOUS MEDICATIONS:   CURRENT MEDICATIONS:  Outpatient Encounter Medications as of 04/01/2023  Medication Sig   allopurinol (ZYLOPRIM) 100 MG tablet Take 1 tablet (100 mg total) by mouth daily.   ferrous sulfate 325 (65 FE) MG tablet Take 325 mg by mouth See admin instructions. Take one tablet (325 mg) by mouth daily Monday thru Friday (skip Saturday and Sunday)   glucose blood test strip Use as instructed to check blood sugars twice daily   Misc Natural Products (BLOOD SUGAR BALANCE PO) Take 1 capsule by mouth daily.    Multiple Vitamin (MULTIVITAMIN WITH MINERALS) TABS tablet Take 1 tablet by mouth daily with breakfast.   NON FORMULARY daily as needed. CBD capsules Pure organic hemp extract   pravastatin (PRAVACHOL) 40 MG tablet One tab po M-F and skip Saturday/Sundays   valsartan (DIOVAN) 160 MG tablet Take 1 tablet (160 mg total) by mouth daily.   [DISCONTINUED] donepezil (ARICEPT) 10 MG tablet Take 1 tab 10 mg daily   Blood Glucose Monitoring Suppl (ACCU-CHEK GUIDE) w/Device KIT Use to check blood sugars twice daily E11.65 (Patient not taking: Reported on 04/01/2023)   donepezil (ARICEPT) 10 MG tablet Take 1 tab 10 mg daily   glucose blood (ACCU-CHEK GUIDE) test strip Use to check blood sugars twice daily E11.65 (Patient not taking: Reported on 04/01/2023)   No facility-administered encounter medications on file as of 04/01/2023.       10/04/2022   11:00 AM 03/19/2022    1:00 PM  MMSE - Mini Mental State Exam  Orientation to time 4 4  Orientation to Place 5 5  Registration 3 3  Attention/ Calculation 5 5  Recall 0 0  Language- name 2 objects 2 2  Language- repeat 1 1  Language- follow 3 step command 3 3  Language- read & follow direction 1 1  Write a sentence 1 1  Copy design 1 1  Total score 26 26      11/11/2020   10:00 AM  Montreal Cognitive  Assessment   Visuospatial/ Executive (0/5) 3  Naming (0/3) 1  Attention: Read list of digits (0/2) 2  Attention: Read list of letters (0/1) 1  Attention: Serial 7 subtraction starting at 100 (0/3) 3  Language: Repeat phrase (0/2) 1  Language : Fluency (0/1) 1  Abstraction (0/2) 1  Delayed Recall (0/5) 1  Orientation (0/6) 5  Total 19  Adjusted Score (based on education) 19    Objective:     PHYSICAL EXAMINATION:    VITALS:   Vitals:   04/01/23 1040  BP: (!) 190/95  Pulse: 77  Resp: 20  SpO2: 97%  Height: 5\' 4"  (1.626 m)    GEN:  The patient appears stated age and is in NAD. HEENT:  Normocephalic, atraumatic.   Neurological examination:  General: NAD, well-groomed, appears stated age. Orientation: The patient is alert. Oriented to person, place and not to date Cranial nerves: There is good facial symmetry.The speech is fluent and clear.  No aphasia or dysarthria. Fund of knowledge is appropriate. Recent and remote memory are impaired. Attention and concentration are reduced.  Able to name objects and repeat phrases.  Hearing is intact to conversational tone.   Sensation: Sensation is intact to light touch throughout Motor: Strength is at least antigravity x4. DTR's 2/4 in UE/LE     Movement examination: Tone: There is normal tone in the UE/LE Abnormal movements:  no tremor.  No myoclonus.  No asterixis.   Coordination:  There is no decremation with RAM's. Normal finger to nose  Gait and Station: The patient has no  difficulty arising out of a deep-seated chair without the use of the hands. The patient's stride length is good.  Gait is cautious and narrow.    Thank you for allowing [place] the opportunity to participate in the care of this nice patient. Please do not hesitate to contact us for any questions or concerns.   Total time spent on today's visit was 35 minutes dedicated to this patient today, preparing to see patient, examining the patient, ordering tests and/or  medications and counseling the patient, documenting clinical information in the EHR or other health record, independently interpreting results and communicating results to the patient/family, discussing treatment and goals, answering patient's questions and coordinating care.  Cc:  No primary care provider on file.  [name] 04/01/2023 12:39 PM

## 2023-08-21 ENCOUNTER — Institutional Professional Consult (permissible substitution): Payer: BC Managed Care – PPO | Admitting: Psychology

## 2023-08-21 ENCOUNTER — Ambulatory Visit: Payer: Self-pay

## 2023-08-28 ENCOUNTER — Encounter: Payer: Medicare HMO | Admitting: Psychology

## 2023-09-20 ENCOUNTER — Ambulatory Visit: Admitting: Physician Assistant

## 2023-09-27 ENCOUNTER — Encounter: Payer: Self-pay | Admitting: Physician Assistant

## 2023-09-27 ENCOUNTER — Ambulatory Visit (INDEPENDENT_AMBULATORY_CARE_PROVIDER_SITE_OTHER): Admitting: Physician Assistant

## 2023-09-27 VITALS — BP 164/68 | HR 76 | Ht 64.0 in | Wt 191.0 lb

## 2023-09-27 DIAGNOSIS — F0280 Dementia in other diseases classified elsewhere without behavioral disturbance: Secondary | ICD-10-CM

## 2023-09-27 DIAGNOSIS — G309 Alzheimer's disease, unspecified: Secondary | ICD-10-CM | POA: Diagnosis not present

## 2023-09-27 NOTE — Patient Instructions (Signed)
 It was a pleasure to see you today at our office.   Recommendations:  Meds:  Follow up in  6 months Resume Donepezil 10 daily          RECOMMENDATIONS FOR ALL PATIENTS WITH MEMORY PROBLEMS: 1. Continue to exercise (Recommend 30 minutes of walking everyday, or 3 hours every week) 2. Increase social interactions - continue going to [place] and enjoy social gatherings with friends and family 3. Eat healthy, avoid fried foods and eat more fruits and vegetables 4. Maintain adequate blood pressure, blood sugar, and blood cholesterol level. Reducing the risk of stroke and cardiovascular disease also helps promoting better memory. 5. Avoid stressful situations. Live a simple life and avoid aggravations. Organize your time and prepare for the next day in anticipation. 6. Sleep well, avoid any interruptions of sleep and avoid any distractions in the bedroom that may interfere with adequate sleep quality 7. Avoid sugar, avoid sweets as there is a strong link between excessive sugar intake, diabetes, and cognitive impairment We discussed the Mediterranean diet, which has been shown to help patients reduce the risk of progressive memory disorders and reduces cardiovascular risk. This includes eating fish, eat fruits and green leafy vegetables, nuts like almonds and hazelnuts, walnuts, and also use olive oil. Avoid fast foods and fried foods as much as possible. Avoid sweets and sugar as sugar use has been linked to worsening of memory function.  There is always a concern of gradual progression of memory problems. If this is the case, then we may need to adjust level of care according to patient needs. Support, both to the patient and caregiver, should then be put into place.    The [employer] Association is here all day, every day for people facing Alzheimer's disease through our free 24/7 Helpline: 907 662 1439. The Helpline provides reliable information and support to all those who need assistance,  such as individuals living with memory loss, Alzheimer's or other dementia, caregivers, health care professionals and the public.  Our highly trained and knowledgeable staff can help you with: Understanding memory loss, dementia and Alzheimer's  Medications and other treatment options  General information about aging and brain health  Skills to provide quality care and to find the best care from professionals  Legal, financial and living-arrangement decisions Our Helpline also features: Confidential care consultation provided by master's level clinicians who can help with decision-making support, crisis assistance and education on issues families face every day  Help in a caller's preferred language using our translation service that features more than 200 languages and dialects  Referrals to local community programs, services and ongoing support     FALL PRECAUTIONS: Be cautious when walking. Scan the area for obstacles that may increase the risk of trips and falls. When getting up in the mornings, sit up at the edge of the bed for a few minutes before getting out of bed. Consider elevating the bed at the head end to avoid drop of blood pressure when getting up. Walk always in a well-lit room (use night lights in the walls). Avoid area rugs or power cords from appliances in the middle of the walkways. Use a walker or a cane if necessary and consider physical therapy for balance exercise. Get your eyesight checked regularly.  FINANCIAL OVERSIGHT: Supervision, especially oversight when making financial decisions or transactions is also recommended.  HOME SAFETY: Consider the safety of the kitchen when operating appliances like stoves, microwave oven, and blender. Consider having supervision and share cooking responsibilities until  no longer able to participate in those. Accidents with firearms and other hazards in the house should be identified and addressed as well.   ABILITY TO BE LEFT ALONE:  If patient is unable to contact 911 operator, consider using LifeLine, or when the need is there, arrange for someone to stay with patients. Smoking is a fire hazard, consider supervision or cessation. Risk of wandering should be assessed by caregiver and if detected at any point, supervision and safe proof recommendations should be instituted.  MEDICATION SUPERVISION: Inability to self-administer medication needs to be constantly addressed. Implement a mechanism to ensure safe administration of the medications.          Mediterranean Diet A Mediterranean diet refers to food and lifestyle choices that are based on the traditions of countries located on the [employer]. This way of eating has been shown to help prevent certain conditions and improve outcomes for people who have chronic diseases, like kidney disease and heart disease. What are tips for following this plan? Lifestyle  Cook and eat meals together with your family, when possible. Drink enough fluid to keep your urine clear or pale yellow. Be physically active every day. This includes: Aerobic exercise like running or swimming. Leisure activities like gardening, walking, or housework. Get 7-8 hours of sleep each night. If recommended by your health care provider, drink red wine in moderation. This means 1 glass a day for nonpregnant women and 2 glasses a day for men. A glass of wine equals 5 oz (150 mL). Reading food labels  Check the serving size of packaged foods. For foods such as rice and pasta, the serving size refers to the amount of cooked product, not dry. Check the total fat in packaged foods. Avoid foods that have saturated fat or trans fats. Check the ingredients list for added sugars, such as corn syrup. Shopping  At the grocery store, buy most of your food from the areas near the walls of the store. This includes: Fresh fruits and vegetables (produce). Grains, beans, nuts, and seeds. Some of these may be  available in unpackaged forms or large amounts (in bulk). Fresh seafood. Poultry and eggs. Low-fat dairy products. Buy whole ingredients instead of prepackaged foods. Buy fresh fruits and vegetables in-season from local farmers markets. Buy frozen fruits and vegetables in resealable bags. If you do not have access to quality fresh seafood, buy precooked frozen shrimp or canned fish, such as tuna, salmon, or sardines. Buy small amounts of raw or cooked vegetables, salads, or olives from the deli or salad bar at your store. Stock your pantry so you always have certain foods on hand, such as olive oil, canned tuna, canned tomatoes, rice, pasta, and beans. Cooking  Cook foods with extra-virgin olive oil instead of using butter or other vegetable oils. Have meat as a side dish, and have vegetables or grains as your main dish. This means having meat in small portions or adding small amounts of meat to foods like pasta or stew. Use beans or vegetables instead of meat in common dishes like chili or lasagna. Experiment with different cooking methods. Try roasting or broiling vegetables instead of steaming or sauteing them. Add frozen vegetables to soups, stews, pasta, or rice. Add nuts or seeds for added healthy fat at each meal. You can add these to yogurt, salads, or vegetable dishes. Marinate fish or vegetables using olive oil, lemon juice, garlic, and fresh herbs. Meal planning  Plan to eat 1 vegetarian meal one day each  week. Try to work up to 2 vegetarian meals, if possible. Eat seafood 2 or more times a week. Have healthy snacks readily available, such as: Vegetable sticks with hummus. Greek yogurt. Fruit and nut trail mix. Eat balanced meals throughout the week. This includes: Fruit: 2-3 servings a day Vegetables: 4-5 servings a day Low-fat dairy: 2 servings a day Fish, poultry, or lean meat: 1 serving a day Beans and legumes: 2 or more servings a week Nuts and seeds: 1-2 servings a  day Whole grains: 6-8 servings a day Extra-virgin olive oil: 3-4 servings a day Limit red meat and sweets to only a few servings a month What are my food choices? Mediterranean diet Recommended Grains: Whole-grain pasta. Brown rice. Bulgar wheat. Polenta. Couscous. Whole-wheat bread. Orpah Cobb. Vegetables: Artichokes. Beets. Broccoli. Cabbage. Carrots. Eggplant. Green beans. Chard. Kale. Spinach. Onions. Leeks. Peas. Squash. Tomatoes. Peppers. Radishes. Fruits: Apples. Apricots. Avocado. Berries. Bananas. Cherries. Dates. Figs. Grapes. Lemons. Melon. Oranges. Peaches. Plums. Pomegranate. Meats and other protein foods: Beans. Almonds. Sunflower seeds. Pine nuts. Peanuts. Cod. Salmon. Scallops. Shrimp. Tuna. Tilapia. Clams. Oysters. Eggs. Dairy: Low-fat milk. Cheese. Greek yogurt. Beverages: Water. Red wine. Herbal tea. Fats and oils: Extra virgin olive oil. Avocado oil. Grape seed oil. Sweets and desserts: [place] yogurt with honey. Baked apples. Poached pears. Trail mix. Seasoning and other foods: Basil. Cilantro. Coriander. Cumin. Mint. Parsley. Sage. Rosemary. Tarragon. Garlic. Oregano. Thyme. Pepper. Balsalmic vinegar. Tahini. Hummus. Tomato sauce. Olives. Mushrooms. Limit these Grains: Prepackaged pasta or rice dishes. Prepackaged cereal with added sugar. Vegetables: Deep fried potatoes (french fries). Fruits: Fruit canned in syrup. Meats and other protein foods: Beef. Pork. Lamb. Poultry with skin. Hot dogs. Tomasa Blase. Dairy: Ice cream. Sour cream. Whole milk. Beverages: Juice. Sugar-sweetened soft drinks. Beer. Liquor and spirits. Fats and oils: Butter. Canola oil. Vegetable oil. Beef fat (tallow). Lard. Sweets and desserts: Cookies. Cakes. Pies. Candy. Seasoning and other foods: Mayonnaise. Premade sauces and marinades. The items listed may not be a complete list. Talk with your dietitian about what dietary choices are right for you. Summary The Mediterranean diet includes both  food and lifestyle choices. Eat a variety of fresh fruits and vegetables, beans, nuts, seeds, and whole grains. Limit the amount of red meat and sweets that you eat. Talk with your health care provider about whether it is safe for you to drink red wine in moderation. This means 1 glass a day for nonpregnant women and 2 glasses a day for men. A glass of wine equals 5 oz (150 mL). This information is not intended to replace advice given to you by your health care provider. Make sure you discuss any questions you have with your health care provider. Document Released: 12/22/2015 Document Revised: 01/24/2016 Document Reviewed: 12/22/2015 Elsevier Interactive Patient Education  2017 [employer].

## 2023-09-27 NOTE — Progress Notes (Signed)
 Assessment/Plan:   Dementia likely due to Alzheimer's disease  Pamela Key is a very pleasant 83 y.o. RH female with a history of hypertension, hyperlipidemia, B12 deficiency requiring injections, CKD CAD and a diagnosis of dementia due to Alzheimer's disease per latest neuropsychological evaluation on 03/19/2023 seen today in follow up for memory loss. Patient is currently on donepezil  10 mg daily, and in the past had not been compliant with it.  Mild cognitive decline is noted, with MMSE today at 23/30.  We may consider adding memantine to the regimen for better coverage if she adheres to the regimen.  She is able to participate on her ADLs.  No longer drives.    Follow up in 6 months. Continue donepezil  10 mg daily, side effects discussed  Recommend good control of her cardiovascular risk factors Continue to control mood as per PCP     Subjective:    This patient is accompanied in the office by her daughter and granddaughter who supplement  the history.  Previous records as well as any outside records available were reviewed prior to todays visit. Patient was last seen on 04/01/2023 with last MMSE by 2024 was 26/30     Any changes in memory since last visit? " Maybe a little worse " She has not been taking her medicines. She continues to have difficulty remembering names of people, conversations, new information.  She likes going to the [employer], doing crossword puzzles, word finding.  She enjoys reading extensively. repeats oneself?  Endorsed Disoriented when walking into a room?  Patient denies.   Leaving objects?  May misplace things but not in unusual places   Wandering behavior?  denies   Any personality changes since last visit?  denies   Any worsening depression?:  Denies.   Hallucinations or paranoia?  Denies.   Seizures? denies    Any sleep changes?  Denies vivid dreams, REM behavior or sleepwalking   Sleep apnea?   Denies.   Any hygiene concerns? Denies.   Independent of bathing and dressing?  Endorsed  Does the patient needs help with medications? Patient is in charge, has not been taking her medications    Who is in charge of the finances? Son is in charge    Any changes in appetite?  denies    Patient have trouble swallowing? Denies.   Does the patient cook? No, her grandson prepares the meals Any headaches?   denies   Chronic back pain  denies   Ambulates with difficulty? Denies.  She uses a right cane for stability  Recent falls or head injuries? denies     Unilateral weakness, numbness or tingling? denies   Any tremors?  Denies   Any anosmia?  Denies   Any incontinence of urine?  Endorsed, wears diapers  Any bowel dysfunction?   Denies      Patient lives with her grandson. Trying to get some help to stay with her  Does the patient drive? No longer drives    Neuropsych evaluation 03/19/2023  . Briefly, results suggested severe impairment surrounding all aspects of learning and memory, while performance variability was exhibited across confrontation naming. A relative weakness was further exhibited across semantic fluency relative to its phonemic counterpart. Relative to her previous evaluation in October 2022, decline was exhibited across semantic fluency. Severe amnestic memory performances were exhibited across both evaluations. Outside of these domains, general stability was elsewhere exhibited. [facility] was previously diagnosed with a mild dementia due to Alzheimer's disease  presentation following the completion of her neuropsychological evaluation in October 2022 Elmer Hackney, [name]). This remains an accurate conceptualization in my opinion and I agree with this prior theorization. Across current memory testing, [facility] was fully amnestic (i.e., 0% retention) after brief delays and performed very poorly across yes/no recognition trials. The same patterns of severe memory impairment were previously seen across testing. Taken  together, this continues to suggest evidence for rapid forgetting and severe storage impairment, both of which are the hallmark testing characteristics of this illness.   PREVIOUS MEDICATIONS:   CURRENT MEDICATIONS:  Outpatient Encounter Medications as of 09/27/2023  Medication Sig   allopurinol  (ZYLOPRIM ) 100 MG tablet Take 1 tablet (100 mg total) by mouth daily.   donepezil  (ARICEPT ) 10 MG tablet Take 1 tab 10 mg daily   ferrous sulfate 325 (65 FE) MG tablet Take 325 mg by mouth See admin instructions. Take one tablet (325 mg) by mouth daily Monday thur wednesdy   glucose blood test strip Use as instructed to check blood sugars twice daily   Misc Natural Products (BLOOD SUGAR BALANCE PO) Take 1 capsule by mouth daily.    Multiple Vitamin (MULTIVITAMIN WITH MINERALS) TABS tablet Take 1 tablet by mouth daily with breakfast.   pravastatin  (PRAVACHOL ) 40 MG tablet One tab po M-F and skip Saturday/Sundays   valsartan  (DIOVAN ) 160 MG tablet Take 1 tablet (160 mg total) by mouth daily.   Blood Glucose Monitoring Suppl (ACCU-CHEK GUIDE) w/Device KIT Use to check blood sugars twice daily E11.65 (Patient not taking: Reported on 09/27/2023)   glucose blood (ACCU-CHEK GUIDE) test strip Use to check blood sugars twice daily E11.65 (Patient not taking: Reported on 09/27/2023)   NON FORMULARY daily as needed. CBD capsules Pure organic hemp extract (Patient not taking: Reported on 09/27/2023)   No facility-administered encounter medications on file as of 09/27/2023.       09/27/2023    5:00 PM 10/04/2022   11:00 AM 03/19/2022    1:00 PM  MMSE - Mini Mental State Exam  Orientation to time 2 4 4   Orientation to Place 4 5 5   Registration 3 3 3   Attention/ Calculation 5 5 5   Recall 0 0 0  Language- name 2 objects 2 2 2   Language- repeat 1 1 1   Language- follow 3 step command 3 3 3   Language- read & follow direction 1 1 1   Write a sentence 1 1 1   Copy design 1 1 1   Total score 23 26 26       11/11/2020    10:00 AM  Montreal Cognitive Assessment   Visuospatial/ Executive (0/5) 3  Naming (0/3) 1  Attention: Read list of digits (0/2) 2  Attention: Read list of letters (0/1) 1  Attention: Serial 7 subtraction starting at 100 (0/3) 3  Language: Repeat phrase (0/2) 1  Language : Fluency (0/1) 1  Abstraction (0/2) 1  Delayed Recall (0/5) 1  Orientation (0/6) 5  Total 19  Adjusted Score (based on education) 19    Objective:     PHYSICAL EXAMINATION:    VITALS:   Vitals:   09/27/23 1537  BP: (!) 164/68  Pulse: 76  SpO2: 100%  Weight: 191 lb (86.6 kg)  Height: 5\' 4"  (1.626 m)    GEN:  The patient appears stated age and is in NAD. HEENT:  Normocephalic, atraumatic.   Neurological examination:  General: NAD, well-groomed, appears stated age. Orientation: The patient is alert. Oriented to person, place and not to  date Cranial nerves: There is good facial symmetry.The speech is fluent and clear. No aphasia or dysarthria. Fund of knowledge is appropriate. Recent and remote memory are impaired. Attention and concentration are reduced.  Able to name objects and repeat phrases.  Hearing is intact to conversational tone.   Sensation: Sensation is intact to light touch throughout Motor: Strength is at least antigravity x4. DTR's 2/4 in UE/LE     Movement examination: Tone: There is normal tone in the UE/LE Abnormal movements:  no tremor.  No myoclonus.  No asterixis.   Coordination:  There is no decremation with RAM's. Normal finger to nose  Gait and Station: The patient has no difficulty arising out of a deep-seated chair without the use of the hands. The patient's stride length is good.  Gait is cautious and narrow.    Thank you for allowing us  the opportunity to participate in the care of this nice patient. Please do not hesitate to contact us  for any questions or concerns.   Total time spent on today's visit was 25 minutes dedicated to this patient today, preparing to see  patient, examining the patient, ordering tests and/or medications and counseling the patient, documenting clinical information in the EHR or other health record, independently interpreting results and communicating results to the patient/family, discussing treatment and goals, answering patient's questions and coordinating care.  Cc:  No primary care provider on file.  Antrice Pal 09/27/2023 6:30 PM

## 2024-03-31 NOTE — Progress Notes (Signed)
 Assessment/Plan:   Dementia likely due to Alzheimer's disease  Pamela Key is a very pleasant 83 y.o. RH female with a history ofhypertension, hyperlipidemia, B12 deficiency requiring injections, CKD CAD and a diagnosis of dementia due to Alzheimer's disease  seen today in follow up for memory loss. Patient is currently on donepezil  10 mg daily.  Memory is***with MMSE today at /30.  Patient is able to participate on ADLs, mood is stable***  Follow up in   months. Continue donepezil  10 mg daily, side effects discussed*** Recommend good control of her cardiovascular risk factors Continue to control mood as per PCP     Subjective:    This patient is accompanied in the office by her daughter and granddaughter*** who supplements the history.  Previous records as well as any outside records available were reviewed prior to todays visit. Patient was last seen on 09/27/2023 with MMSE 23/30***   Any changes in memory since last visit? .  She is nonadherent to medicines.  Continues to have difficulty remembering names of people, conversations, new information.  She and Merlynn going to [employer] center, doing crossword puzzles, word finding.  Enjoys reading extensively. repeats oneself?  Endorsed Disoriented when walking into a room? Denies ***  Leaving objects?  May misplace things but not in unusual places***  Wandering behavior?  denies   Any personality changes since last visit?  Denies.   Any worsening depression?:  Denies.   Hallucinations or paranoia?  Denies.   Seizures? denies    Any sleep changes?  Denies vivid dreams, REM behavior or sleepwalking   Sleep apnea?   Denies.   Any hygiene concerns? Denies.  Independent of bathing and dressing?  Endorsed  Does the patient needs help with medications?  Patient is in charge she misses several doses*** Who is in charge of the finances?  Her son is in charge   *** Any changes in appetite?  denies ***   Patient have trouble  swallowing? Denies.   Does the patient cook? No grandson prepares the meals Any headaches?   denies   Any vision changes?*** Chronic back pain  denies   Ambulates with difficulty? Denies.  Uses a right cane for stability*** Recent falls or head injuries? Denies.     Unilateral weakness, numbness or tingling? denies   Any tremors?  Denies  *** Any anosmia?  Denies   Any incontinence of urine?  Endorsed, wears diapers***  Any bowel dysfunction?   Denies      Patient lives with her grandson, also trying to get some help to stay with her*** Does the patient drive? No longer drives ***  Neuropsych evaluation 03/19/2023  . Briefly, results suggested severe impairment surrounding all aspects of learning and memory, while performance variability was exhibited across confrontation naming. A relative weakness was further exhibited across semantic fluency relative to its phonemic counterpart. Relative to her previous evaluation in October 2022, decline was exhibited across semantic fluency. Severe amnestic memory performances were exhibited across both evaluations. Outside of these domains, general stability was elsewhere exhibited. 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 This remains an accurate conceptualization in my opinion and I agree with this prior theorization. Across current memory testing, Pamela Key was fully amnestic (i.e., 0% retention) after brief delays and performed very poorly across yes/no recognition trials. The same patterns of severe memory impairment were previously  seen across testing. Taken together, this continues to suggest evidence for rapid forgetting and severe storage impairment, both of which are the hallmark testing characteristics of this illness.   PREVIOUS MEDICATIONS:   CURRENT MEDICATIONS:  Outpatient Encounter  Medications as of 04/01/2024  Medication Sig   allopurinol  (ZYLOPRIM ) 100 MG tablet Take 1 tablet (100 mg total) by mouth daily.   Blood Glucose Monitoring Suppl (ACCU-CHEK GUIDE) w/Device KIT Use to check blood sugars twice daily E11.65 (Patient not taking: Reported on 09/27/2023)   donepezil  (ARICEPT ) 10 MG tablet Take 1 tab 10 mg daily   ferrous sulfate 325 (65 FE) MG tablet Take 325 mg by mouth See admin instructions. Take one tablet (325 mg) by mouth daily Monday thur wednesdy   glucose blood (ACCU-CHEK GUIDE) test strip Use to check blood sugars twice daily E11.65 (Patient not taking: Reported on 09/27/2023)   glucose blood test strip Use as instructed to check blood sugars twice daily   Misc Natural Products (BLOOD SUGAR BALANCE PO) Take 1 capsule by mouth daily.    Multiple Vitamin (MULTIVITAMIN WITH MINERALS) TABS tablet Take 1 tablet by mouth daily with breakfast.   NON FORMULARY daily as needed. CBD capsules Pure organic hemp extract (Patient not taking: Reported on 09/27/2023)   pravastatin  (PRAVACHOL ) 40 MG tablet One tab po M-F and skip Saturday/Sundays   valsartan  (DIOVAN ) 160 MG tablet Take 1 tablet (160 mg total) by mouth daily.   No facility-administered encounter medications on file as of 04/01/2024.       09/27/2023    5:00 PM 10/04/2022   11:00 AM 03/19/2022    1:00 PM  MMSE - Mini Mental State Exam  Orientation to time 2 4 4   Orientation to Place 4 5 5   Registration 3 3 3   Attention/ Calculation 5 5 5   Recall 0 0 0  Language- name 2 objects 2 2 2   Language- repeat 1 1 1   Language- follow 3 step command 3 3 3   Language- read & follow direction 1 1 1   Write a sentence 1 1 1   Copy design 1 1 1   Total score 23 26 26       11/11/2020   10:00 AM  Montreal Cognitive Assessment   Visuospatial/ Executive (0/5) 3  Naming (0/3) 1  Attention: Read list of digits (0/2) 2  Attention: Read list of letters (0/1) 1  Attention: Serial 7 subtraction starting at 100 (0/3) 3   Language: Repeat phrase (0/2) 1  Language : Fluency (0/1) 1  Abstraction (0/2) 1  Delayed Recall (0/5) 1  Orientation (0/6) 5  Total 19  Adjusted Score (based on education) 19    Objective:    Neurological Exam:    VITALS:  There were no vitals filed for this visit.  GEN:  The patient appears stated age and is in NAD. HEENT:  Normocephalic, atraumatic.   Neurological examination:  General: NAD, well-groomed, appears stated age. Orientation: The patient is alert. Oriented to person, place and not to date Cranial nerves: There is good facial symmetry.The speech is fluent and clear. No aphasia or dysarthria. Fund of knowledge is appropriate. Recent and remote memory are impaired. Attention and concentration are reduced. Able to name objects and repeat phrases.  Hearing is intact to conversational tone. *** Sensation: Sensation is intact to light touch throughout Motor: Strength is at least antigravity x4. DTR's 2/4 in UE/LE     Movement examination: Tone: There is normal tone in the UE/LE Abnormal movements:  no tremor.  No myoclonus.  No asterixis.   Coordination:  There is no decremation with RAM's. Normal finger to nose  Gait and Station: The patient has no*** difficulty arising out of a deep-seated chair without the use of the hands. The patient's stride length is good.  Uses a cane for stability gait is cautious and narrow.    Thank you for allowing us  the opportunity to participate in the care of this nice patient. Please do not hesitate to contact us  for any questions or concerns.   Total time spent on today's visit was *** minutes dedicated to this patient today, preparing to see patient, examining the patient, ordering tests and/or medications and counseling the patient, documenting clinical information in the EHR or other health record, independently interpreting results and communicating results to the patient/family, discussing treatment and goals, answering patient's  questions and coordinating care.  Cc:  No primary care provider on file.  Camie Sevin 03/31/2024 5:47 AM

## 2024-04-01 ENCOUNTER — Other Ambulatory Visit: Payer: Self-pay | Admitting: Physician Assistant

## 2024-04-01 ENCOUNTER — Ambulatory Visit (INDEPENDENT_AMBULATORY_CARE_PROVIDER_SITE_OTHER): Admitting: Physician Assistant

## 2024-04-01 ENCOUNTER — Encounter: Payer: Self-pay | Admitting: Physician Assistant

## 2024-04-01 VITALS — BP 206/80

## 2024-04-01 DIAGNOSIS — G309 Alzheimer's disease, unspecified: Secondary | ICD-10-CM

## 2024-04-01 DIAGNOSIS — F0280 Dementia in other diseases classified elsewhere without behavioral disturbance: Secondary | ICD-10-CM

## 2024-04-01 MED ORDER — MEMANTINE HCL 5 MG PO TABS: ORAL_TABLET | ORAL | 11 refills | Status: DC

## 2024-04-01 MED ORDER — DONEPEZIL HCL 10 MG PO TABS: ORAL_TABLET | ORAL | 11 refills | Status: AC

## 2024-04-01 NOTE — Patient Instructions (Signed)
 It was a pleasure to see you today at our office.   Recommendations:  Meds:  Follow up in  6 months Resume Donepezil  10 daily    Start Memantine 5mg  tablets.  Take 1 tablet at bedtime for 2 weeks, then 1 tablet twice daily.       RECOMMENDATIONS FOR ALL PATIENTS WITH MEMORY PROBLEMS: 1. Continue to exercise (Recommend 30 minutes of walking everyday, or 3 hours every week) 2. Increase social interactions - continue going to [place] and enjoy social gatherings with friends and family 3. Eat healthy, avoid fried foods and eat more fruits and vegetables 4. Maintain adequate blood pressure, blood sugar, and blood cholesterol level. Reducing the risk of stroke and cardiovascular disease also helps promoting better memory. 5. Avoid stressful situations. Live a simple life and avoid aggravations. Organize your time and prepare for the next day in anticipation. 6. Sleep well, avoid any interruptions of sleep and avoid any distractions in the bedroom that may interfere with adequate sleep quality 7. Avoid sugar, avoid sweets as there is a strong link between excessive sugar intake, diabetes, and cognitive impairment We discussed the Mediterranean diet, which has been shown to help patients reduce the risk of progressive memory disorders and reduces cardiovascular risk. This includes eating fish, eat fruits and green leafy vegetables, nuts like almonds and hazelnuts, walnuts, and also use olive oil. Avoid fast foods and fried foods as much as possible. Avoid sweets and sugar as sugar use has been linked to worsening of memory function.  There is always a concern of gradual progression of memory problems. If this is the case, then we may need to adjust level of care according to patient needs. Support, both to the patient and caregiver, should then be put into place.    The Alzheimer's Association is here all day, every day for people facing Alzheimer's disease through our free 24/7 Helpline:  [phone]. The Helpline provides reliable information and support to all those who need assistance, such as individuals living with memory loss, Alzheimer's or other dementia, caregivers, health care professionals and the public.  Our highly trained and knowledgeable staff can help you with: Understanding memory loss, dementia and Alzheimer's  Medications and other treatment options  General information about aging and brain health  Skills to provide quality care and to find the best care from professionals  Legal, financial and living-arrangement decisions Our Helpline also features: Confidential care consultation provided by master's level clinicians who can help with decision-making support, crisis assistance and education on issues families face every day  Help in a caller's preferred language using our translation service that features more than 200 languages and dialects  Referrals to local community programs, services and ongoing support     FALL PRECAUTIONS: Be cautious when walking. Scan the area for obstacles that may increase the risk of trips and falls. When getting up in the mornings, sit up at the edge of the bed for a few minutes before getting out of bed. Consider elevating the bed at the head end to avoid drop of blood pressure when getting up. Walk always in a well-lit room (use night lights in the walls). Avoid area rugs or power cords from appliances in the middle of the walkways. Use a walker or a cane if necessary and consider physical therapy for balance exercise. Get your eyesight checked regularly.  FINANCIAL OVERSIGHT: Supervision, especially oversight when making financial decisions or transactions is also recommended.  HOME SAFETY: Consider the safety of the  kitchen when operating appliances like stoves, microwave oven, and blender. Consider having supervision and share cooking responsibilities until no longer able to participate in those. Accidents with firearms and  other hazards in the house should be identified and addressed as well.   ABILITY TO BE LEFT ALONE: If patient is unable to contact 911 operator, consider using LifeLine, or when the need is there, arrange for someone to stay with patients. Smoking is a fire hazard, consider supervision or cessation. Risk of wandering should be assessed by caregiver and if detected at any point, supervision and safe proof recommendations should be instituted.  MEDICATION SUPERVISION: Inability to self-administer medication needs to be constantly addressed. Implement a mechanism to ensure safe administration of the medications.          Mediterranean Diet A Mediterranean diet refers to food and lifestyle choices that are based on the traditions of countries located on the [place]. This way of eating has been shown to help prevent certain conditions and improve outcomes for people who have chronic diseases, like kidney disease and heart disease. What are tips for following this plan? Lifestyle  Cook and eat meals together with your family, when possible. Drink enough fluid to keep your urine clear or pale yellow. Be physically active every day. This includes: Aerobic exercise like running or swimming. Leisure activities like gardening, walking, or housework. Get 7-8 hours of sleep each night. If recommended by your health care provider, drink red wine in moderation. This means 1 glass a day for nonpregnant women and 2 glasses a day for men. A glass of wine equals 5 oz (150 mL). Reading food labels  Check the serving size of packaged foods. For foods such as rice and pasta, the serving size refers to the amount of cooked product, not dry. Check the total fat in packaged foods. Avoid foods that have saturated fat or trans fats. Check the ingredients list for added sugars, such as corn syrup. Shopping  At the grocery store, buy most of your food from the areas near the walls of the store. This  includes: Fresh fruits and vegetables (produce). Grains, beans, nuts, and seeds. Some of these may be available in unpackaged forms or large amounts (in bulk). Fresh seafood. Poultry and eggs. Low-fat dairy products. Buy whole ingredients instead of prepackaged foods. Buy fresh fruits and vegetables in-season from local farmers markets. Buy frozen fruits and vegetables in resealable bags. If you do not have access to quality fresh seafood, buy precooked frozen shrimp or canned fish, such as tuna, salmon, or sardines. Buy small amounts of raw or cooked vegetables, salads, or olives from the deli or salad bar at your store. Stock your pantry so you always have certain foods on hand, such as olive oil, canned tuna, canned tomatoes, rice, pasta, and beans. Cooking  Cook foods with extra-virgin olive oil instead of using butter or other vegetable oils. Have meat as a side dish, and have vegetables or grains as your main dish. This means having meat in small portions or adding small amounts of meat to foods like pasta or stew. Use beans or vegetables instead of meat in common dishes like chili or lasagna. Experiment with different cooking methods. Try roasting or broiling vegetables instead of steaming or sauteing them. Add frozen vegetables to soups, stews, pasta, or rice. Add nuts or seeds for added healthy fat at each meal. You can add these to yogurt, salads, or vegetable dishes. Marinate fish or vegetables using olive oil,  lemon juice, garlic, and fresh herbs. Meal planning  Plan to eat 1 vegetarian meal one day each week. Try to work up to 2 vegetarian meals, if possible. Eat seafood 2 or more times a week. Have healthy snacks readily available, such as: Vegetable sticks with hummus. Greek yogurt. Fruit and nut trail mix. Eat balanced meals throughout the week. This includes: Fruit: 2-3 servings a day Vegetables: 4-5 servings a day Low-fat dairy: 2 servings a day Fish, poultry, or  lean meat: 1 serving a day Beans and legumes: 2 or more servings a week Nuts and seeds: 1-2 servings a day Whole grains: 6-8 servings a day Extra-virgin olive oil: 3-4 servings a day Limit red meat and sweets to only a few servings a month What are my food choices? Mediterranean diet Recommended Grains: Whole-grain pasta. Brown rice. Bulgar wheat. Polenta. Couscous. Whole-wheat bread. [place] Made[place]. Vegetables: Artichokes. Beets. Broccoli. Cabbage. Carrots. Eggplant. Green beans. Chard. Kale. Spinach. Onions. Leeks. Peas. Squash. Tomatoes. Peppers. Radishes. Fruits: Apples. Apricots. Avocado. Berries. Bananas. Cherries. Dates. Figs. Grapes. Lemons. Melon. Oranges. Peaches. Plums. Pomegranate. Meats and other protein foods: Beans. Almonds. Sunflower seeds. Pine nuts. Peanuts. Cod. Salmon. Scallops. Shrimp. Tuna. Tilapia. Clams. Oysters. Eggs. Dairy: Low-fat milk. Cheese. Greek yogurt. Beverages: Water. Red wine. Herbal tea. Fats and oils: Extra virgin olive oil. Avocado oil. Grape seed oil. Sweets and desserts: Greek yogurt with honey. Baked apples. Poached pears. Trail mix. Seasoning and other foods: Basil. Cilantro. Coriander. Cumin. Mint. Parsley. Sage. Rosemary. Tarragon. Garlic. Oregano. Thyme. Pepper. Balsalmic vinegar. Tahini. Hummus. Tomato sauce. Olives. Mushrooms. Limit these Grains: Prepackaged pasta or rice dishes. Prepackaged cereal with added sugar. Vegetables: Deep fried potatoes (french fries). Fruits: Fruit canned in syrup. Meats and other protein foods: Beef. Pork. Lamb. Poultry with skin. Hot dogs. [place]. Dairy: Ice cream. Sour cream. Whole milk. Beverages: Juice. Sugar-sweetened soft drinks. Beer. Liquor and spirits. Fats and oils: Butter. Canola oil. Vegetable oil. Beef fat (tallow). Lard. Sweets and desserts: Cookies. Cakes. Pies. Candy. Seasoning and other foods: Mayonnaise. Premade sauces and marinades. The items listed may not be a complete list. Talk with your  dietitian about what dietary choices are right for you. Summary The Mediterranean diet includes both food and lifestyle choices. Eat a variety of fresh fruits and vegetables, beans, nuts, seeds, and whole grains. Limit the amount of red meat and sweets that you eat. Talk with your health care provider about whether it is safe for you to drink red wine in moderation. This means 1 glass a day for nonpregnant women and 2 glasses a day for men. A glass of wine equals 5 oz (150 mL). This information is not intended to replace advice given to you by your health care provider. Make sure you discuss any questions you have with your health care provider. Document Released: 12/22/2015 Document Revised: 01/24/2016 Document Reviewed: 12/22/2015 Elsevier Interactive Patient Education  2017 [employer].

## 2024-05-08 ENCOUNTER — Other Ambulatory Visit: Payer: Self-pay | Admitting: Physician Assistant

## 2024-10-02 ENCOUNTER — Ambulatory Visit: Admitting: Physician Assistant
